# Patient Record
Sex: Male | Born: 1970 | Race: White | Hispanic: No | Marital: Married | Smoking: Never smoker
Health system: Southern US, Community
[De-identification: ages and names within clinical notes are randomized; demographics above are authoritative.]

## PROBLEM LIST (undated history)

## (undated) DIAGNOSIS — F32A Depression, unspecified: Secondary | ICD-10-CM

## (undated) DIAGNOSIS — I619 Nontraumatic intracerebral hemorrhage, unspecified: Secondary | ICD-10-CM

## (undated) DIAGNOSIS — I10 Essential (primary) hypertension: Secondary | ICD-10-CM

## (undated) DIAGNOSIS — G8929 Other chronic pain: Secondary | ICD-10-CM

## (undated) DIAGNOSIS — R55 Syncope and collapse: Secondary | ICD-10-CM

## (undated) DIAGNOSIS — T7840XA Allergy, unspecified, initial encounter: Secondary | ICD-10-CM

## (undated) DIAGNOSIS — R51 Headache: Secondary | ICD-10-CM

## (undated) DIAGNOSIS — K589 Irritable bowel syndrome without diarrhea: Secondary | ICD-10-CM

## (undated) DIAGNOSIS — R7989 Other specified abnormal findings of blood chemistry: Secondary | ICD-10-CM

## (undated) DIAGNOSIS — F419 Anxiety disorder, unspecified: Secondary | ICD-10-CM

## (undated) DIAGNOSIS — K219 Gastro-esophageal reflux disease without esophagitis: Secondary | ICD-10-CM

## (undated) DIAGNOSIS — F329 Major depressive disorder, single episode, unspecified: Secondary | ICD-10-CM

## (undated) DIAGNOSIS — R519 Headache, unspecified: Secondary | ICD-10-CM

## (undated) DIAGNOSIS — E785 Hyperlipidemia, unspecified: Secondary | ICD-10-CM

## (undated) DIAGNOSIS — B019 Varicella without complication: Secondary | ICD-10-CM

## (undated) DIAGNOSIS — E669 Obesity, unspecified: Secondary | ICD-10-CM

## (undated) HISTORY — DX: Irritable bowel syndrome, unspecified: K58.9

## (undated) HISTORY — DX: Obesity, unspecified: E66.9

## (undated) HISTORY — PX: NO PAST SURGERIES: SHX2092

## (undated) HISTORY — DX: Other specified abnormal findings of blood chemistry: R79.89

## (undated) HISTORY — DX: Allergy, unspecified, initial encounter: T78.40XA

## (undated) HISTORY — DX: Headache, unspecified: R51.9

## (undated) HISTORY — DX: Nontraumatic intracerebral hemorrhage, unspecified: I61.9

## (undated) HISTORY — DX: Anxiety disorder, unspecified: F41.9

## (undated) HISTORY — DX: Essential (primary) hypertension: I10

## (undated) HISTORY — DX: Depression, unspecified: F32.A

## (undated) HISTORY — DX: Syncope and collapse: R55

## (undated) HISTORY — DX: Varicella without complication: B01.9

## (undated) HISTORY — DX: Other chronic pain: G89.29

## (undated) HISTORY — DX: Headache: R51

## (undated) HISTORY — DX: Hyperlipidemia, unspecified: E78.5

## (undated) HISTORY — DX: Gastro-esophageal reflux disease without esophagitis: K21.9

---
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
## 2008-02-27 ENCOUNTER — Emergency Department (HOSPITAL_COMMUNITY): Admission: EM | Admit: 2008-02-27 | Discharge: 2008-02-27 | Payer: Self-pay | Admitting: Emergency Medicine

## 2011-10-21 ENCOUNTER — Other Ambulatory Visit: Payer: Self-pay | Admitting: Internal Medicine

## 2011-11-09 ENCOUNTER — Ambulatory Visit (INDEPENDENT_AMBULATORY_CARE_PROVIDER_SITE_OTHER): Payer: BC Managed Care – PPO | Admitting: Family Medicine

## 2011-11-09 VITALS — BP 117/81 | HR 71 | Temp 98.4°F | Resp 16 | Ht 67.0 in | Wt 215.4 lb

## 2011-11-09 DIAGNOSIS — J029 Acute pharyngitis, unspecified: Secondary | ICD-10-CM

## 2011-11-09 LAB — POCT RAPID STREP A (OFFICE): Rapid Strep A Screen: NEGATIVE

## 2011-11-09 MED ORDER — AMOXICILLIN 875 MG PO TABS: 875.0000 mg | ORAL_TABLET | Freq: Two times a day (BID) | ORAL | Status: AC

## 2011-11-09 NOTE — Progress Notes (Signed)
  Subjective:    Patient ID: Steven Holland, male    DOB: 21-Apr-1971, 41 y.o.   MRN: [id number]  HPI 41 yo male with URI  Symptoms - sore throat, ear pain, bodyaches.  Has had fever to 102.  Symptoms started 4 days ago with sore throat.  Saw white spots in back of throat.  No recent strep.  Still has tonsils.  Headache but no nausea.    Review of Systems Negative except as per HPI     Objective:   Physical Exam  Constitutional: He appears well-developed. No distress.  HENT:  Right Ear: Tympanic membrane, external ear and ear canal normal. Tympanic membrane is not injected, not scarred, not perforated, not erythematous, not retracted and not bulging.  Left Ear: Tympanic membrane, external ear and ear canal normal. Tympanic membrane is not injected, not scarred, not perforated, not erythematous, not retracted and not bulging.  Nose: No mucosal edema or rhinorrhea. Right sinus exhibits no maxillary sinus tenderness and no frontal sinus tenderness. Left sinus exhibits no maxillary sinus tenderness and no frontal sinus tenderness.  Mouth/Throat: Uvula is midline and mucous membranes are normal. Oropharyngeal exudate and posterior oropharyngeal erythema present. No tonsillar abscesses.  Cardiovascular: Normal rate, regular rhythm, normal heart sounds and intact distal pulses.   No murmur heard. Pulmonary/Chest: Effort normal and breath sounds normal. No respiratory distress. He has no wheezes. He has no rales.  Lymphadenopathy:       Head (right side): No submandibular and no preauricular adenopathy present.       Head (left side): No submandibular and no preauricular adenopathy present.       Right cervical: No superficial cervical and no posterior cervical adenopathy present.      Left cervical: No superficial cervical and no posterior cervical adenopathy present.       Right: No supraclavicular adenopathy present.       Left: No supraclavicular adenopathy present.  Skin: Skin is warm  and dry.  Tonsils mildly enlarged, red, with exudate.   Results for orders placed in visit on 11/09/11  POCT RAPID STREP A (OFFICE)      Component Value Range   Rapid Strep A Screen Negative  Negative          Assessment & Plan:  Sore throat, fever, tonsillitis Amoxicillin 875 BID x 10 days

## 2011-12-08 ENCOUNTER — Other Ambulatory Visit: Payer: Self-pay | Admitting: Internal Medicine

## 2011-12-18 ENCOUNTER — Ambulatory Visit (INDEPENDENT_AMBULATORY_CARE_PROVIDER_SITE_OTHER): Payer: BC Managed Care – PPO | Admitting: Internal Medicine

## 2011-12-18 VITALS — BP 115/75 | HR 80 | Temp 98.3°F | Resp 18 | Ht 67.75 in | Wt 217.4 lb

## 2011-12-18 DIAGNOSIS — K589 Irritable bowel syndrome without diarrhea: Secondary | ICD-10-CM

## 2011-12-18 DIAGNOSIS — I10 Essential (primary) hypertension: Secondary | ICD-10-CM | POA: Insufficient documentation

## 2011-12-18 DIAGNOSIS — E291 Testicular hypofunction: Secondary | ICD-10-CM

## 2011-12-18 DIAGNOSIS — K219 Gastro-esophageal reflux disease without esophagitis: Secondary | ICD-10-CM | POA: Insufficient documentation

## 2011-12-18 DIAGNOSIS — N529 Male erectile dysfunction, unspecified: Secondary | ICD-10-CM

## 2011-12-18 DIAGNOSIS — J309 Allergic rhinitis, unspecified: Secondary | ICD-10-CM

## 2011-12-18 DIAGNOSIS — G43109 Migraine with aura, not intractable, without status migrainosus: Secondary | ICD-10-CM | POA: Insufficient documentation

## 2011-12-18 MED ORDER — BISOPROLOL-HYDROCHLOROTHIAZIDE 10-6.25 MG PO TABS: 1.0000 | ORAL_TABLET | Freq: Every day | ORAL | Status: DC

## 2011-12-18 MED ORDER — BUTALBITAL-ASA-CAFF-CODEINE 50-325-40-30 MG PO CAPS: 1.0000 | ORAL_CAPSULE | ORAL | Status: DC | PRN

## 2011-12-18 MED ORDER — HYDROCODONE-ACETAMINOPHEN 5-325 MG PO TABS: 1.0000 | ORAL_TABLET | Freq: Four times a day (QID) | ORAL | Status: DC | PRN

## 2011-12-18 MED ORDER — BELLADONNA ALK-PHENOBARBITAL 48 MG PO TBCR: 1.0000 | EXTENDED_RELEASE_TABLET | Freq: Four times a day (QID) | ORAL | Status: DC | PRN

## 2011-12-18 MED ORDER — ONDANSETRON HCL 8 MG PO TABS: 8.0000 mg | ORAL_TABLET | Freq: Three times a day (TID) | ORAL | Status: DC | PRN

## 2011-12-18 MED ORDER — TADALAFIL 5 MG PO TABS: 2.5000 mg | ORAL_TABLET | Freq: Every day | ORAL | Status: DC

## 2011-12-18 NOTE — Patient Instructions (Signed)
After the lab results I will refill the AndroGel

## 2011-12-18 NOTE — Progress Notes (Addendum)
  Subjective:    Patient ID: Steven Holland, male    DOB: 1971/01/10, 41 y.o.   MRN: [id number]  HPI Patient Active Problem List  Diagnoses  . Hypogonadism male  . ED (erectile dysfunction)  . HTN (hypertension)  . Migraine headache with aura  . AR (allergic rhinitis)  . GERD (gastroesophageal reflux disease)  . Irritable bowel syndrome (IBS)  here for followup of his problems. Starting testosterone in November 2012 has been successful with improved mood and strength with change in belt size.Cialis 2.5 daily has also resulted in improved function and he is very satisfied. His other problems are stable at this point but he needs medicine refills. His headaches are much improved with occasional mild headaches and rare full-blown migraines.      Review of Systems  Constitutional: Negative for fatigue and unexpected weight change.  HENT: Negative for neck pain.        His allergies are active but are controlled by over-the-counter antihistamines  Eyes: Negative for visual disturbance.  Respiratory: Negative for apnea, cough, choking and wheezing.   Cardiovascular: Negative for chest pain, palpitations and leg swelling.  Gastrointestinal: Negative for nausea, abdominal pain, diarrhea and constipation.  Genitourinary: Negative for urgency, frequency and difficulty urinating.  Musculoskeletal: Negative for arthralgias.  Neurological: Negative for dizziness and light-headedness.  Psychiatric/Behavioral: Negative for sleep disturbance, dysphoric mood, decreased concentration and agitation.       Objective:   Physical Examvital signs are stable except weight 217 HEENT is clear Neurological is intact        Assessment & Plan:   Patient Active Problem List  Diagnoses  . Hypogonadism male  . ED (erectile dysfunction)  . HTN (hypertension)  . Migraine headache with aura  . AR (allergic rhinitis)  . GERD (gastroesophageal reflux disease)  . Irritable bowel syndrome (IBS)     All medicines are refilled for 6-12 months except androgel which will be evaluated after labs are completed/ This includes Ziac 10/6.25 Zofran, Donatal, Vicodin, Fioricet    Addendum 12/23/2011= testosterone levels are good at this dose so refill is faxed to pharmacy to continue 8 pumps daily= 10 g

## 2011-12-19 ENCOUNTER — Encounter: Payer: Self-pay | Admitting: Internal Medicine

## 2011-12-19 LAB — TESTOSTERONE, FREE, TOTAL, SHBG
Sex Hormone Binding: 13 nmol/L (ref 13–71)
Testosterone, Free: 117.3 pg/mL (ref 47.0–244.0)

## 2011-12-23 MED ORDER — TESTOSTERONE 12.5 MG/ACT (1%) TD GEL: 8.0000 "application " | Freq: Every day | TRANSDERMAL | Status: DC

## 2011-12-23 NOTE — Progress Notes (Signed)
Addended by: Tonye Pearson on: 12/23/2011 08:06 AM   Modules accepted: Orders

## 2012-02-03 ENCOUNTER — Ambulatory Visit (INDEPENDENT_AMBULATORY_CARE_PROVIDER_SITE_OTHER): Payer: BC Managed Care – PPO | Admitting: Family Medicine

## 2012-02-03 VITALS — BP 112/72 | HR 79 | Temp 98.4°F | Resp 20 | Ht 67.5 in | Wt 217.0 lb

## 2012-02-03 DIAGNOSIS — R821 Myoglobinuria: Secondary | ICD-10-CM

## 2012-02-03 DIAGNOSIS — R319 Hematuria, unspecified: Secondary | ICD-10-CM

## 2012-02-03 DIAGNOSIS — H209 Unspecified iridocyclitis: Secondary | ICD-10-CM

## 2012-02-03 DIAGNOSIS — R8281 Pyuria: Secondary | ICD-10-CM

## 2012-02-03 LAB — POCT CBC
Granulocyte percent: 53.6 %G (ref 37–80)
HCT, POC: 42.7 % — AB (ref 43.5–53.7)
Hemoglobin: 13.9 g/dL — AB (ref 14.1–18.1)
Lymph, poc: 2.3 (ref 0.6–3.4)
MCH, POC: 28.8 pg (ref 27–31.2)
MCHC: 32.6 g/dL (ref 31.8–35.4)
MCV: 88.4 fL (ref 80–97)
MID (cbc): 0.8 (ref 0–0.9)
MPV: 7.6 fL (ref 0–99.8)
POC Granulocyte: 3.6 (ref 2–6.9)
POC LYMPH PERCENT: 34.2 %L (ref 10–50)
POC MID %: 12.2 %M — AB (ref 0–12)
Platelet Count, POC: 251 10*3/uL (ref 142–424)
RBC: 4.83 M/uL (ref 4.69–6.13)
RDW, POC: 14.7 %
WBC: 6.8 10*3/uL (ref 4.6–10.2)

## 2012-02-03 LAB — COMPREHENSIVE METABOLIC PANEL
ALT: 28 U/L (ref 0–53)
AST: 19 U/L (ref 0–37)
Albumin: 4.4 g/dL (ref 3.5–5.2)
Alkaline Phosphatase: 74 U/L (ref 39–117)
BUN: 12 mg/dL (ref 6–23)
CO2: 30 mEq/L (ref 19–32)
Calcium: 9.1 mg/dL (ref 8.4–10.5)
Chloride: 100 mEq/L (ref 96–112)
Creat: 0.97 mg/dL (ref 0.50–1.35)
Glucose, Bld: 101 mg/dL — ABNORMAL HIGH (ref 70–99)
Potassium: 3.8 mEq/L (ref 3.5–5.3)
Sodium: 139 mEq/L (ref 135–145)
Total Bilirubin: 0.3 mg/dL (ref 0.3–1.2)
Total Protein: 7.6 g/dL (ref 6.0–8.3)

## 2012-02-03 LAB — POCT URINALYSIS DIPSTICK
Bilirubin, UA: NEGATIVE
Blood, UA: NEGATIVE
Glucose, UA: NEGATIVE
Ketones, UA: NEGATIVE
Nitrite, UA: NEGATIVE
Protein, UA: NEGATIVE
Spec Grav, UA: 1.025
Urobilinogen, UA: 0.2
pH, UA: 5.5

## 2012-02-03 LAB — POCT UA - MICROSCOPIC ONLY
Casts, Ur, LPF, POC: NEGATIVE
Crystals, Ur, HPF, POC: NEGATIVE
Mucus, UA: NEGATIVE
Yeast, UA: NEGATIVE

## 2012-02-03 LAB — POCT SEDIMENTATION RATE: POCT SED RATE: 50 mm/hr — AB (ref 0–22)

## 2012-02-03 MED ORDER — CIPROFLOXACIN HCL 500 MG PO TABS: 500.0000 mg | ORAL_TABLET | Freq: Two times a day (BID) | ORAL | Status: AC

## 2012-02-03 MED ORDER — PREDNISOLONE ACETATE 1 % OP SUSP: 1.0000 [drp] | Freq: Three times a day (TID) | OPHTHALMIC | Status: AC

## 2012-02-03 NOTE — Patient Instructions (Signed)
Recheck in 5 days   Urinary Tract Infection Infections of the urinary tract can start in several places. A bladder infection (cystitis), a kidney infection (pyelonephritis), and a prostate infection (prostatitis) are different types of urinary tract infections (UTIs). They usually get better if treated with medicines (antibiotics) that kill germs. Take all the medicine until it is gone. You or your child may feel better in a few days, but TAKE ALL MEDICINE or the infection may not respond and may become more difficult to treat. HOME CARE INSTRUCTIONS   Drink enough water and fluids to keep the urine clear or pale yellow. Cranberry juice is especially recommended, in addition to large amounts of water.   Avoid caffeine, tea, and carbonated beverages. They tend to irritate the bladder.   Alcohol may irritate the prostate.   Only take over-the-counter or prescription medicines for pain, discomfort, or fever as directed by your caregiver.  To prevent further infections:  Empty the bladder often. Avoid holding urine for long periods of time.   After a bowel movement, women should cleanse from front to back. Use each tissue only once.   Empty the bladder before and after sexual intercourse.  FINDING OUT THE RESULTS OF YOUR TEST Not all test results are available during your visit. If your or your child's test results are not back during the visit, make an appointment with your caregiver to find out the results. Do not assume everything is normal if you have not heard from your caregiver or the medical facility. It is important for you to follow up on all test results. SEEK MEDICAL CARE IF:   There is back pain.   Your baby is older than 3 months with a rectal temperature of 100.5 F (38.1 C) or higher for more than 1 day.   Your or your child's problems (symptoms) are no better in 3 days. Return sooner if you or your child is getting worse.  SEEK IMMEDIATE MEDICAL CARE IF:   There is  severe back pain or lower abdominal pain.   You or your child develops chills.   You have a fever.   Your baby is older than 3 months with a rectal temperature of 102 F (38.9 C) or higher.   Your baby is 72 months old or younger with a rectal temperature of 100.4 F (38 C) or higher.   There is nausea or vomiting.   There is continued burning or discomfort with urination.  MAKE SURE YOU:   Understand these instructions.   Will watch your condition.   Will get help right away if you are not doing well or get worse.  Document Released: 06/01/2005 Document Revised: 08/11/2011 Document Reviewed: 01/04/2007 [facility] Patient Information 2012 [place], [place].Iritis Iritis is an inflammation of the colored part of the eye (iris). Other parts at the front of the eye may also be inflamed. The iris is part of the middle layer of the eyeball which is called the uvea or the uveal track. Any part of the uveal track can become inflamed. The other portions of the uveal track are the choroid (the thin membrane under the outer layer of the eye), and the ciliary body (joins the choroid and the iris and produces the fluid in the front of the eye).  It is extremely important to treat iritis early, as it may lead to internal eye damage causing scarring or diseases such as glaucoma. Some people have only one attack of iritis (in one or both eyes)  in their lifetime, while others may get it many times. CAUSES Iritis can be associated with many different diseases, but mostly occurs in otherwise healthy people. Examples of diseases that can be associated with iritis include:  Diseases where the body's immune system attacks tissues within your own body (autoimmune diseases).   Infections (tuberculosis, gonorrhea, fungus infections, Lyme disease, infection of the lining of the heart).   Trauma or injury.   Eye diseases (acute glaucoma and others).   Inflammation from other parts of the uveal track.    Severe eye infections.   Other rare diseases.  SYMPTOMS  Eye pain or aching.   Sensitivity to light.   Loss of sight or blurred vision.   Redness of the eye. This is often accompanied by a ring of redness around the outside of the cornea, or clear covering at the front of the eye (ciliary flush).   Excessive tearing of the eye(s).   A small pupil that does not enlarge in the dark and stays smaller than the other eye's pupil.   A whitish area that obscures the lower part of the colored circular iris. Sometimes this is visible when looking at the eye, where the whitish area has a "fluid level" or flat top. This is called a "hypopyon" and is actually pus inside the eye.  Since iritis causes the eye to become red, it is often confused with a much less dangerous form of "pink eye" or conjunctivitis. One of the most important symptoms is sensitivity to light. Anytime there is redness, discomfort in the eye(s) and extreme light sensitivity, it is extremely important to see an ophthalmologist as soon as possible. TREATMENT Acute iritis requires prompt medical evaluation by an eye specialist (ophthalmologist.) Treatment depends on the underlying cause but may include:  Corticosteroid eye drops and dilating eye drops. Follow your caregiver's exact instructions on taking and stopping corticosteroid medications (drops or pills).   Occasionally, the iritis will be so severe that it will not respond to commonly used medications. If this happens, it may be necessary to use steroid injections. The injections are given under the eye's outer surface. Sometimes oral medications are given. The decision on treatment used for iritis is usually made on an individual basis.  HOME CARE INSTRUCTIONS Your care giver will give specific instructions regarding the use of eye medications or other medications. Be certain to follow all instructions in both taking and stopping the medications. SEEK IMMEDIATE MEDICAL  CARE IF:  You have redness of one or both eye.   You experience a great deal of light sensitivity.   You have pain or aching in either eye.  MAKE SURE YOU:   Understand these instructions.   Will watch your condition.   Will get help right away if you are not doing well or get worse.  Document Released: 08/22/2005 Document Revised: 08/11/2011 Document Reviewed: 02/09/2007 [facility] Patient Information 2012 [place], [place].

## 2012-02-03 NOTE — Progress Notes (Signed)
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 Hematuria is gotten worse in the last 24 hours.  Patient's family history is positive for joint pains (mother), and heart disease (father)  Patient has no joint problems, no rash, no dysphagia or oral ulcers.  Objective: Right eye shows diffuse scleral injection with perilimbal predominance.  Oropharynx: Clear TMs: Normal Neck: Supple, no adenopathy or thyromegaly Chest: Clear to auscultation Heart: Regular, no murmur no gallop and no rub Abdomen: Soft nontender without HSM or masses Genitalia: Circumcised male no lesions, no hernia, normal testes Skin: No rashes    Assessment:  Plan: Metabolic profile, sedimentation rate, urine culture, ANA are all pending Pred forte to right eye 3 times a day x3 days cipro 500 bid x 7 days followup 5 days

## 2012-02-05 ENCOUNTER — Other Ambulatory Visit: Payer: Self-pay | Admitting: Family Medicine

## 2012-02-05 DIAGNOSIS — H209 Unspecified iridocyclitis: Secondary | ICD-10-CM

## 2012-02-05 DIAGNOSIS — R700 Elevated erythrocyte sedimentation rate: Secondary | ICD-10-CM

## 2012-02-05 LAB — URINE CULTURE
Colony Count: NO GROWTH
Organism ID, Bacteria: NO GROWTH

## 2012-02-06 LAB — ANA: Anti Nuclear Antibody(ANA): NEGATIVE

## 2012-02-07 ENCOUNTER — Encounter: Payer: Self-pay | Admitting: *Deleted

## 2012-02-19 ENCOUNTER — Other Ambulatory Visit: Payer: Self-pay | Admitting: *Deleted

## 2012-02-19 DIAGNOSIS — G43109 Migraine with aura, not intractable, without status migrainosus: Secondary | ICD-10-CM

## 2012-02-19 MED ORDER — HYDROCODONE-ACETAMINOPHEN 5-325 MG PO TABS: 1.0000 | ORAL_TABLET | ORAL | Status: DC | PRN

## 2012-04-27 ENCOUNTER — Telehealth: Payer: Self-pay

## 2012-04-27 NOTE — Telephone Encounter (Signed)
Could try regular Donnatal tablets to take one before each meal and one at bedtime as needed #60 with 2 refills

## 2012-04-27 NOTE — Telephone Encounter (Signed)
Donnatol extended release not available, can you advise on alternative?

## 2012-04-27 NOTE — Telephone Encounter (Signed)
[facility] [place] [facility] pts [facility] for darnatol. States the extended tablets are not available and ask for an alternative.   [facility]: [id number]  bf

## 2012-04-28 NOTE — Telephone Encounter (Signed)
Called [facility] [place], advised pharmacist RX to change to regular Donnatal.

## 2012-07-16 ENCOUNTER — Ambulatory Visit (INDEPENDENT_AMBULATORY_CARE_PROVIDER_SITE_OTHER): Payer: BC Managed Care – PPO | Admitting: Internal Medicine

## 2012-07-16 VITALS — BP 138/90 | HR 96 | Temp 98.6°F | Resp 16 | Ht 67.25 in | Wt 217.8 lb

## 2012-07-16 DIAGNOSIS — J019 Acute sinusitis, unspecified: Secondary | ICD-10-CM

## 2012-07-16 DIAGNOSIS — E291 Testicular hypofunction: Secondary | ICD-10-CM

## 2012-07-16 DIAGNOSIS — J329 Chronic sinusitis, unspecified: Secondary | ICD-10-CM

## 2012-07-16 MED ORDER — TESTOSTERONE 12.5 MG/ACT (1%) TD GEL: 8.0000 "application " | Freq: Every day | TRANSDERMAL | Status: DC

## 2012-07-16 MED ORDER — AMOXICILLIN 500 MG PO CAPS: 1000.0000 mg | ORAL_CAPSULE | Freq: Two times a day (BID) | ORAL | Status: AC

## 2012-07-16 NOTE — Progress Notes (Signed)
  Subjective:    Patient ID: Steven Holland, male    DOB: Sep 07, 1970, 41 y.o.   MRN: [id number]  HPI Ever since having a bad chest: Week ago he has had pain above his left eye that gets worse as the day goes on and is relieved by lying flat and sleeping during the night No fever/congested in the a.m. Only/no sore throat/no cough   Review of Systems Feels good at this dose of AndroGel/ Labs were good at this dose in April   needs refill Objective:   Physical Exam Filed Vitals:   07/16/12 1637  BP: 138/90  Pulse: 96  Temp: 98.6 F (37 C)  Resp: 16   Pupils equal round reactive to light Conjunctiva clear Nares boggy Very tender to percussion left frontal Throat clear No nodes       Assessment & Plan:  Problem #1 sinusitis Amoxicillin 1 g twice a day for 10 days  Problem #2 hypogonadism Refill AndroGel-labs in 6 mo  Meds ordered this encounter  Medications  . amoxicillin (AMOXIL) 500 MG capsule    Sig: Take 2 capsules (1,000 mg total) by mouth 2 (two) times daily.    Dispense:  40 capsule    Refill:  0  . Testosterone (ANDROGEL PUMP) 12.5 MG/ACT (1%) GEL    Sig: Apply 8 application topically daily. 10 g daily applied by 8 pumps    Dispense:  1 Bottle    Refill:  5

## 2012-09-21 ENCOUNTER — Other Ambulatory Visit: Payer: Self-pay | Admitting: Internal Medicine

## 2012-12-17 ENCOUNTER — Ambulatory Visit (INDEPENDENT_AMBULATORY_CARE_PROVIDER_SITE_OTHER): Payer: BC Managed Care – PPO | Admitting: Internal Medicine

## 2012-12-17 VITALS — BP 127/82 | HR 86 | Temp 97.8°F | Resp 17 | Ht 67.25 in | Wt 213.2 lb

## 2012-12-17 DIAGNOSIS — G43909 Migraine, unspecified, not intractable, without status migrainosus: Secondary | ICD-10-CM

## 2012-12-17 DIAGNOSIS — H6592 Unspecified nonsuppurative otitis media, left ear: Secondary | ICD-10-CM

## 2012-12-17 DIAGNOSIS — H6590 Unspecified nonsuppurative otitis media, unspecified ear: Secondary | ICD-10-CM

## 2012-12-17 DIAGNOSIS — I10 Essential (primary) hypertension: Secondary | ICD-10-CM

## 2012-12-17 DIAGNOSIS — K589 Irritable bowel syndrome without diarrhea: Secondary | ICD-10-CM

## 2012-12-17 DIAGNOSIS — N529 Male erectile dysfunction, unspecified: Secondary | ICD-10-CM

## 2012-12-17 DIAGNOSIS — E291 Testicular hypofunction: Secondary | ICD-10-CM

## 2012-12-17 LAB — POCT CBC
Granulocyte percent: 70.2 %G (ref 37–80)
MCV: 89.3 fL (ref 80–97)
MID (cbc): 0.7 (ref 0–0.9)
MPV: 7.7 fL (ref 0–99.8)
POC Granulocyte: 7.6 — AB (ref 2–6.9)
POC LYMPH PERCENT: 23.5 %L (ref 10–50)
POC MID %: 6.3 %M (ref 0–12)
Platelet Count, POC: 332 10*3/uL (ref 142–424)
RBC: 5.32 M/uL (ref 4.69–6.13)
RDW, POC: 13.9 %

## 2012-12-17 MED ORDER — HYDROCODONE-ACETAMINOPHEN 5-325 MG PO TABS: ORAL_TABLET | ORAL | Status: DC

## 2012-12-17 MED ORDER — BUTALBITAL-ASA-CAFF-CODEINE 50-325-40-30 MG PO CAPS: ORAL_CAPSULE | ORAL | Status: DC

## 2012-12-17 MED ORDER — TESTOSTERONE 12.5 MG/ACT (1%) TD GEL: 8.0000 "application " | Freq: Every day | TRANSDERMAL | Status: DC

## 2012-12-17 MED ORDER — AMOXICILLIN 875 MG PO TABS: 875.0000 mg | ORAL_TABLET | Freq: Two times a day (BID) | ORAL | Status: DC

## 2012-12-17 MED ORDER — BISOPROLOL-HYDROCHLOROTHIAZIDE 10-6.25 MG PO TABS: 1.0000 | ORAL_TABLET | Freq: Every day | ORAL | Status: DC

## 2012-12-17 MED ORDER — BELLADONNA ALK-PHENOBARBITAL 48 MG PO TBCR: 1.0000 | EXTENDED_RELEASE_TABLET | Freq: Four times a day (QID) | ORAL | Status: DC | PRN

## 2012-12-17 MED ORDER — ONDANSETRON HCL 8 MG PO TABS: 8.0000 mg | ORAL_TABLET | Freq: Three times a day (TID) | ORAL | Status: DC | PRN

## 2012-12-17 MED ORDER — TADALAFIL 2.5 MG PO TABS: 2.5000 mg | ORAL_TABLET | Freq: Every day | ORAL | Status: DC

## 2012-12-17 NOTE — Progress Notes (Signed)
  Subjective:    Patient ID: Steven Holland, male    DOB: 1971-05-24, 42 y.o.   MRN: [id number]  HPI  Patient Active Problem List  Diagnosis  . Hypogonadism male--on Andro pump and doing well---no further sweats Feels like much more energy and strength  . ED (erectile dysfunction)---daily cialis working well  . HTN (hypertension)- has had variable pressures--was low in Jan-too low/felt weak and dizzy, so he discontinued his medication and blood pressure 100/60 ///it took 2 months for blood pressure reached 139/90 and he restarted medication   . Migraine headache with aura--still vary with weather but no missed work  . AR (allergic rhinitis)--has started meds again due to symptoms   . GERD (gastroesophageal reflux disease)--controlled omep 10 otc  . Irritable bowel syndrome (IBS)---probably due to lactose intolerance No further passing out spells since lactose free occas donnatal   Otalgia 3d//decr hearing Started w/ AR 1 week ago   Review of Systems  Constitutional: Negative for activity change, appetite change, fatigue and unexpected weight change.  Eyes: Negative for visual disturbance.  Respiratory: Negative for shortness of breath.   Cardiovascular: Negative for chest pain, palpitations and leg swelling.  Gastrointestinal: Negative for diarrhea and constipation.  Genitourinary: Negative for difficulty urinating.  Musculoskeletal: Negative for back pain.  Skin: Negative for rash.  Psychiatric/Behavioral: Negative for sleep disturbance.       Objective:   Physical Exam BP 127/82  Pulse 86  Temp(Src) 97.8 F (36.6 C) (Oral)  Resp 17  Ht 5' 7.25" (1.708 m)  Wt 213 lb 3.2 oz (96.707 kg)  BMI 33.15 kg/m2  SpO2 96% Conjunctiva clear Left TM red with fluid level Right TM clear Nares boggy Throat clear No nodes Chest clear Heart regular       Assessment & Plan:  ED (erectile dysfunction) - Plan: tadalafil 2.5 MG TABS  HTN (hypertension) - Plan:  bisoprolol-hydrochlorothiazide (ZIAC) 10-6.25 MG per tablet, POCT CBC, Comprehensive metabolic panel, Lipid panel////continue to monitor home blood pressures and we can adjust medicines if he becomes low again  Hypogonadism male - Plan: Testosterone (ANDROGEL PUMP) 12.5 MG/ACT (1%) GEL, PSA, Testosterone, free, total  Irritable bowel syndrome (IBS) - Plan: ondansetron (ZOFRAN) 8 MG tablet, phenobarbital-belladonna alkaloids (DONNATAL EXTENTABS) 48 MG CR tablet(((pharmacy called and Extentabs no longer available so this was changed to Donatal regular tabs)))  Migraine - Plan: butalbital-aspirin-caffeine-codeine (ASCOMP-CODEINE) 50-325-40-30 MG capsule, HYDROcodone-acetaminophen (NORCO/VICODIN) 5-325 MG per tablet///when headaches are out of control he takes the codeine first and if no change in 2 hrs will change to hydrocodone  OME (otitis media with effusion), left - Plan: amoxicillin (AMOXIL) 875 MG tablet  F/u 6-12 mos

## 2012-12-18 LAB — COMPREHENSIVE METABOLIC PANEL
ALT: 26 U/L (ref 0–53)
AST: 17 U/L (ref 0–37)
Albumin: 4.5 g/dL (ref 3.5–5.2)
Alkaline Phosphatase: 83 U/L (ref 39–117)
BUN: 12 mg/dL (ref 6–23)
Calcium: 9.7 mg/dL (ref 8.4–10.5)
Chloride: 102 mEq/L (ref 96–112)
Potassium: 4.6 mEq/L (ref 3.5–5.3)

## 2012-12-18 LAB — PSA: PSA: 0.94 ng/mL (ref <=4.00)

## 2012-12-18 LAB — LIPID PANEL
HDL: 35 mg/dL — ABNORMAL LOW (ref >39)
LDL Cholesterol: 119 mg/dL — ABNORMAL HIGH (ref 0–99)
Total CHOL/HDL Ratio: 5.7 Ratio

## 2012-12-19 LAB — TESTOSTERONE, FREE, TOTAL, SHBG
Testosterone, Free: 47.3 pg/mL (ref 47.0–244.0)
Testosterone: 176 ng/dL — ABNORMAL LOW (ref 300–890)

## 2012-12-24 ENCOUNTER — Encounter: Payer: Self-pay | Admitting: Internal Medicine

## 2013-08-07 ENCOUNTER — Telehealth: Payer: Self-pay

## 2013-08-07 ENCOUNTER — Encounter: Payer: Self-pay | Admitting: Internal Medicine

## 2013-08-07 ENCOUNTER — Ambulatory Visit (INDEPENDENT_AMBULATORY_CARE_PROVIDER_SITE_OTHER): Payer: BC Managed Care – PPO | Admitting: Internal Medicine

## 2013-08-07 VITALS — BP 126/90 | HR 75 | Temp 98.4°F | Resp 16 | Ht 67.0 in | Wt 220.8 lb

## 2013-08-07 DIAGNOSIS — R197 Diarrhea, unspecified: Secondary | ICD-10-CM

## 2013-08-07 DIAGNOSIS — G43109 Migraine with aura, not intractable, without status migrainosus: Secondary | ICD-10-CM

## 2013-08-07 DIAGNOSIS — K219 Gastro-esophageal reflux disease without esophagitis: Secondary | ICD-10-CM

## 2013-08-07 DIAGNOSIS — K589 Irritable bowel syndrome without diarrhea: Secondary | ICD-10-CM

## 2013-08-07 DIAGNOSIS — G43909 Migraine, unspecified, not intractable, without status migrainosus: Secondary | ICD-10-CM

## 2013-08-07 DIAGNOSIS — I10 Essential (primary) hypertension: Secondary | ICD-10-CM

## 2013-08-07 DIAGNOSIS — H0289 Other specified disorders of eyelid: Secondary | ICD-10-CM

## 2013-08-07 DIAGNOSIS — E291 Testicular hypofunction: Secondary | ICD-10-CM

## 2013-08-07 DIAGNOSIS — N529 Male erectile dysfunction, unspecified: Secondary | ICD-10-CM

## 2013-08-07 MED ORDER — TESTOSTERONE 12.5 MG/ACT (1%) TD GEL: 8.0000 "application " | Freq: Every day | TRANSDERMAL | Status: DC

## 2013-08-07 MED ORDER — BUTALBITAL-ASA-CAFF-CODEINE 50-325-40-30 MG PO CAPS: ORAL_CAPSULE | ORAL | Status: DC

## 2013-08-07 MED ORDER — HYDROCODONE-ACETAMINOPHEN 5-325 MG PO TABS: ORAL_TABLET | ORAL | Status: DC

## 2013-08-07 NOTE — Telephone Encounter (Signed)
[facility] called and wanted [place] to know that they don't make 1% Andro Gel any more Dr Merla Riches changed it to 1.62% with same directions

## 2013-08-07 NOTE — Progress Notes (Signed)
Subjective:    Patient ID: Steven Holland, male    DOB: [birth year], 42 y.o.   MRN: [id number]  HPIf/u meds etc Patient Active Problem List   Diagnosis Date Noted  . Hypogonadism male 12/18/2011  . ED (erectile dysfunction) 12/18/2011  . HTN (hypertension) 12/18/2011  . Migraine headache with aura 12/18/2011  . AR (allergic rhinitis) 12/18/2011  . GERD (gastroesophageal reflux disease) 12/18/2011  . Irritable bowel syndrome (IBS) 12/18/2011   Current outpatient prescriptions:bisoprolol-hydrochlorothiazide (ZIAC) 10-6.25 MG per tablet, Take 1 tablet by mouth daily., Disp: 90 tablet, Rfl: 3;  butalbital-aspirin-caffeine-codeine (ASCOMP-CODEINE) 50-325-40-30 MG capsule, TAKE 1 CAPSULE BY MOUTH EVERY 4 HOURS AS NEEDED, Disp: 30 capsule, Rfl: 5;  cetirizine (ZYRTEC) 10 MG tablet, Take 10 mg by mouth daily., Disp: , Rfl:  HYDROcodone-acetaminophen (NORCO/VICODIN) 5-325 MG per tablet, TAKE 1 TABLET BY MOUTH EVERY 6 HOURS AS NEEDED, Disp: 90 tablet, Rfl: 0;  Multiple Vitamin (MULITIVITAMIN WITH MINERALS) TABS, Take 1 tablet by mouth daily., Disp: , Rfl: ;  omeprazole (PRILOSEC) 10 MG capsule, Take 10 mg by mouth daily., Disp: , Rfl: ;  ondansetron (ZOFRAN) 8 MG tablet, Take 1 tablet (8 mg total) by mouth every 8 (eight) hours as needed., Disp: 20 tablet, Rfl: 5 tadalafil 2.5 MG TABS, Take 1 tablet (2.5 mg total) by mouth daily., Disp: 30 tablet, Rfl: 11;  Testosterone (ANDROGEL PUMP) 12.5 MG/ACT (1%) GEL, Apply 8 application topically daily. 10 g daily applied by 8 pumps, Disp: 1 Bottle, Rfl: 5   IBS-nausea,intstinal cramping(milk but not all dairy) Diarrhea still frequent no pattern Tried probiotics-got worse Gluten not defn an issue  ED doing well w/ testos  htn stable  HAs doing well  Gerd stable  Funny irrit of lower eyelid on L-recurrent-small tender white spots inside lower lid/come and go--has tried lubricants, all drops, heat w/out resolution  Review of Systems    Constitutional: Negative for fever, chills, activity change, appetite change, fatigue and unexpected weight change.  HENT: Negative for hearing loss and trouble swallowing.   Eyes: Negative for visual disturbance.  Respiratory: Negative for chest tightness and shortness of breath.   Cardiovascular: Negative for chest pain, palpitations and leg swelling.  Gastrointestinal: Negative for abdominal pain.  Genitourinary: Negative for difficulty urinating.  Psychiatric/Behavioral: Negative for behavioral problems, confusion, sleep disturbance, self-injury and decreased concentration. The patient is not nervous/anxious.        Objective:   Physical Exam  Nursing note and vitals reviewed. Constitutional: He is oriented to person, place, and time. He appears well-developed and well-nourished. No distress.  HENT:  Head: Normocephalic and atraumatic.  Nose: Nose normal.  Mouth/Throat: Oropharynx is clear and moist.  Eyes: Pupils are equal, round, and reactive to light.  No discrete lesions L eye  Neck: Normal range of motion.  Cardiovascular: Normal rate and regular rhythm.   Pulmonary/Chest: Effort normal. No respiratory distress.  Musculoskeletal: Normal range of motion.  Neurological: He is alert and oriented to person, place, and time.  Skin: Skin is warm and dry.  Psychiatric: He has a normal mood and affect. His behavior is normal.       Assessment & Plan:  Migraine - Plan: butalbital-aspirin-caffeine-codeine (ASCOMP-CODEINE) 50-325-40-30 MG capsule, HYDROcodone-acetaminophen (NORCO/VICODIN) 5-325 MG per tablet  Irritable bowel syndrome (IBS) - Plan: Ambulatory referral to Gastroenterology  Hypogonadism male - Plan: Testosterone (ANDROGEL PUMP) 12.5 MG/ACT (1%) GEL  Irritation of eyelid---?chronic hordeolums--to opthal  Diarrhea - Plan: Ambulatory referral to Gastroenterology to r/o other cause besides IBS  Migraine headache with aura-stable on meds  HTN  (hypertension)-stable  GERD (gastroesophageal reflux disease)  ED (erectile dysfunction)

## 2013-08-07 NOTE — Telephone Encounter (Signed)
[facility] FROM [facility] HAVE QUESTIONS REGARDING PT'S MEDICINE. PLEASE CALL [phone]

## 2014-01-02 ENCOUNTER — Other Ambulatory Visit: Payer: Self-pay | Admitting: Internal Medicine

## 2014-01-04 ENCOUNTER — Other Ambulatory Visit: Payer: Self-pay | Admitting: Internal Medicine

## 2014-01-06 ENCOUNTER — Other Ambulatory Visit: Payer: Self-pay | Admitting: Internal Medicine

## 2014-03-30 ENCOUNTER — Other Ambulatory Visit: Payer: Self-pay | Admitting: Internal Medicine

## 2014-03-31 NOTE — Telephone Encounter (Signed)
Faxed rx

## 2014-04-17 ENCOUNTER — Other Ambulatory Visit: Payer: Self-pay | Admitting: Internal Medicine

## 2014-04-17 NOTE — Telephone Encounter (Signed)
I refilled pt's other Rx for 1 mos w/note to RTC for more. Do you want to give him 1 mos RF of androgel?

## 2014-04-18 NOTE — Telephone Encounter (Signed)
Faxed

## 2014-05-09 ENCOUNTER — Ambulatory Visit (INDEPENDENT_AMBULATORY_CARE_PROVIDER_SITE_OTHER): Payer: BC Managed Care – PPO | Admitting: Internal Medicine

## 2014-05-09 VITALS — BP 136/92 | HR 86 | Temp 98.4°F | Resp 16 | Ht 67.75 in | Wt 226.2 lb

## 2014-05-09 DIAGNOSIS — E291 Testicular hypofunction: Secondary | ICD-10-CM

## 2014-05-09 DIAGNOSIS — I10 Essential (primary) hypertension: Secondary | ICD-10-CM

## 2014-05-09 DIAGNOSIS — G44219 Episodic tension-type headache, not intractable: Secondary | ICD-10-CM

## 2014-05-09 DIAGNOSIS — G44211 Episodic tension-type headache, intractable: Secondary | ICD-10-CM

## 2014-05-09 LAB — CBC WITH DIFFERENTIAL/PLATELET
BASOS PCT: 0 % (ref 0–1)
Basophils Absolute: 0 10*3/uL (ref 0.0–0.1)
EOS ABS: 0.1 10*3/uL (ref 0.0–0.7)
EOS PCT: 1 % (ref 0–5)
HCT: 42.7 % (ref 39.0–52.0)
Hemoglobin: 14.9 g/dL (ref 13.0–17.0)
LYMPHS ABS: 2 10*3/uL (ref 0.7–4.0)
Lymphocytes Relative: 20 % (ref 12–46)
MCH: 29.8 pg (ref 26.0–34.0)
MCHC: 34.9 g/dL (ref 30.0–36.0)
MCV: 85.4 fL (ref 78.0–100.0)
Monocytes Absolute: 0.8 10*3/uL (ref 0.1–1.0)
Monocytes Relative: 8 % (ref 3–12)
Neutro Abs: 7.1 10*3/uL (ref 1.7–7.7)
Neutrophils Relative %: 71 % (ref 43–77)
Platelets: 255 10*3/uL (ref 150–400)
RBC: 5 MIL/uL (ref 4.22–5.81)
RDW: 14 % (ref 11.5–15.5)
WBC: 10 10*3/uL (ref 4.0–10.5)

## 2014-05-09 MED ORDER — BUTALBITAL-APAP-CAFF-COD 50-325-40-30 MG PO CAPS: ORAL_CAPSULE | ORAL | Status: DC

## 2014-05-09 MED ORDER — ONDANSETRON HCL 8 MG PO TABS: ORAL_TABLET | ORAL | Status: DC

## 2014-05-09 MED ORDER — TESTOSTERONE 20.25 MG/ACT (1.62%) TD GEL: TRANSDERMAL | Status: DC

## 2014-05-09 MED ORDER — HYDROCODONE-ACETAMINOPHEN 5-325 MG PO TABS: ORAL_TABLET | ORAL | Status: DC

## 2014-05-09 MED ORDER — BISOPROLOL-HYDROCHLOROTHIAZIDE 10-6.25 MG PO TABS: ORAL_TABLET | ORAL | Status: DC

## 2014-05-09 MED ORDER — TOPIRAMATE 25 MG PO TABS: 25.0000 mg | ORAL_TABLET | Freq: Every day | ORAL | Status: DC

## 2014-05-09 NOTE — Progress Notes (Signed)
Subjective:  This chart was scribed for Tonye Pearson, MD by Bronson Curb, ED Scribe. This patient was seen in room Room/bed 13 and the patient's care was started at 2:53 PM.    Patient ID: Steven Holland, male    DOB: 08-21-1971, 43 y.o.   MRN: [id number]  HPI  HPI Comments: Steven Holland is a 43 y.o. male who presents to the Emergency Department complaining of gradually worsening, moderate HA that have become more frequent over the last several months. Patient reports his HA's "get worse every year", and states he missed work yesterday due to his most recent HA (onset 1 week ago). He reports wakes up with a majority of his HA's ("80%"), and states they are not consistently responding to medication (Fioricet as 1st line with hydrocodone as 2nd line), and reports success only "half of the time". He reports his HAs are usually triggered by changes in weather (from rain to sun or from sun to rain). This year, he notices more instances of HA with transitions from rainy weather to sunny weather with the HA remaining until the barometric pressure changes with the weather. Patient states his HA's are never one-sided and begin as "lower grade HA's" that are primarily located in the back of his head and radiates to his temples with progression. There is associated occasional nausea, dizzinness (at times), and sensitivity to odors and motion. He states ambient lighting exacerbates his symptoms. Patient states he has been snoring the past few years, and feels as though he has not had enough sleep upon waking. He states this is due to only getting 4-5 hours of sleep, but denies trouble staying awake, and reports no detrimental effects to his daytime routines. Patient denies vision changes, neck pain/stiffness. Patient has history of HTN, migraine, AR, GERD, IBS, ED, and hypogonadism Current job not perceived as stressful like his last one. In the past his headaches improved with chiropractic  acupuncture.  Patient also states he is running low on his BP medication. He reports gradual weight gain despite working outside during the summer. He states the testerone increases his appetite, and is looking into changing is diet. He still using Zofran for nausea caused by his HA's (and other sources of nausea), however, he states he only has to take this on very rare occasions. He denies any recent syncopal episodes. He denies any recent chagnes with his BP and takes his BP medication on occasion. Patient wears glasses and states he has not had his eyes examined in several years, but plans to get them rechecked this fall.  Testosterone replacement is effective    Patient Active Problem List   Diagnosis Date Noted  . Hypogonadism male 12/18/2011  . ED (erectile dysfunction) 12/18/2011  . HTN (hypertension) 12/18/2011  . Migraine headache with aura 12/18/2011  . AR (allergic rhinitis) 12/18/2011  . GERD (gastroesophageal reflux disease) 12/18/2011  . Irritable bowel syndrome (IBS) 12/18/2011      Review of Systems  Constitutional: Negative for fever and chills.  Eyes: Positive for photophobia. Negative for visual disturbance.  Gastrointestinal: Positive for nausea (occasional).  Musculoskeletal: Negative for neck pain and neck stiffness.  Neurological: Positive for dizziness and headaches.  Psychiatric/Behavioral: Negative for sleep disturbance.  No daytime hypersomnolence or nonrestorative sleep despite snoring     Objective:   Physical Exam  Nursing note and vitals reviewed. Constitutional: He is oriented to person, place, and time. He appears well-developed and well-nourished. No distress.  HENT:  Head:  Normocephalic and atraumatic.  Eyes: Conjunctivae and EOM are normal. Pupils are equal, round, and reactive to light.  Neck: Normal range of motion. Neck supple. No thyromegaly present.  Cardiovascular: Normal rate, regular rhythm and normal heart sounds.   No murmur  heard. Pulmonary/Chest: Effort normal and breath sounds normal. No respiratory distress.  Musculoskeletal: Normal range of motion. He exhibits no edema.  Lymphadenopathy:    He has no cervical adenopathy.  Neurological: He is alert and oriented to person, place, and time. No cranial nerve deficit. He exhibits normal muscle tone. Coordination normal.  Skin: Skin is warm and dry.  Psychiatric: He has a normal mood and affect. His behavior is normal. Judgment and thought content normal.      BP 136/92  Pulse 86  Temp(Src) 98.4 F (36.9 C) (Oral)  Resp 16  Ht 5' 7.75" (1.721 m)  Wt 226 lb 3.2 oz (102.604 kg)  BMI 34.64 kg/m2  SpO2 97%     Assessment & Plan:  Intractable episodic tension-type headache - Plan: ondansetron (ZOFRAN) 8 MG tablet  Return to acupuncture [facility]  Start Topamax-watch for side effects  No change in current regimen which works over 50% of the time  Essential hypertension - Plan: CBC with Differential, Comprehensive metabolic panel, Lipid panel, bisoprolol-hydrochlorothiazide (ZIAC) 10-6.25 MG per tablet refilled  Hypogonadism male - Plan: TSH, PSA, Testosterone 20.25 MG/ACT (1.62%) GEL, Testosterone, free, total  Meds ordered this encounter  Medications  . DISCONTD: Testosterone 20.25 MG/ACT (1.62%) GEL    Sig: APPLY 4 PUMPS TOPICALLY DAILY. PATIENT NEEDS OFFICE VISIT FOR ADDITIONAL REFILLS  . Testosterone 20.25 MG/ACT (1.62%) GEL    Sig: APPLY 4 PUMPS TOPICALLY DAILY.    Dispense:  75 g    Refill:  5  . bisoprolol-hydrochlorothiazide (ZIAC) 10-6.25 MG per tablet    Sig: TAKE 1 TABLET BY MOUTH DAILY.    Dispense:  90 tablet    Refill:  3  . ondansetron (ZOFRAN) 8 MG tablet    Sig: TAKE 1 TABLET (8 MG TOTAL) BY MOUTH EVERY 8 (EIGHT) HOURS AS NEEDED.    Dispense:  20 tablet    Refill:  5  . topiramate (TOPAMAX) 25 MG tablet    Sig: Take 1 tablet (25 mg total) by mouth daily. after 1 week, increase to 2 hs.    Dispense:  60 tablet     Refill:  5  . butalbital-acetaminophen-caffeine (FIORICET WITH CODEINE) 50-325-40-30 MG per capsule    Sig: TAKE ONE CAPSULE BY MOUTH EVERY 4 HOURS AS NEEDED    Dispense:  30 capsule    Refill:  5    This request is for a new prescription for a controlled substance as required by Federal/State law.  Marland Kitchen HYDROcodone-acetaminophen (NORCO/VICODIN) 5-325 MG per tablet    Sig: TAKE 1 TABLET BY MOUTH EVERY 6 HOURS AS NEEDED    Dispense:  90 tablet    Refill:  0   Routine labs  I have completed the patient encounter in its entirety as documented by the scribe, with editing by me where necessary. Traevon Meiring P. Merla Riches, M.D.

## 2014-05-10 LAB — COMPREHENSIVE METABOLIC PANEL
ALK PHOS: 78 U/L (ref 39–117)
ALT: 37 U/L (ref 0–53)
AST: 25 U/L (ref 0–37)
Albumin: 4.6 g/dL (ref 3.5–5.2)
BILIRUBIN TOTAL: 0.3 mg/dL (ref 0.2–1.2)
BUN: 15 mg/dL (ref 6–23)
CO2: 27 meq/L (ref 19–32)
CREATININE: 0.84 mg/dL (ref 0.50–1.35)
Calcium: 9.6 mg/dL (ref 8.4–10.5)
Chloride: 100 mEq/L (ref 96–112)
GLUCOSE: 114 mg/dL — AB (ref 70–99)
Potassium: 4.1 mEq/L (ref 3.5–5.3)
Sodium: 140 mEq/L (ref 135–145)
Total Protein: 7.7 g/dL (ref 6.0–8.3)

## 2014-05-10 LAB — LIPID PANEL
CHOL/HDL RATIO: 4.9 ratio
Cholesterol: 204 mg/dL — ABNORMAL HIGH (ref 0–200)
HDL: 42 mg/dL (ref >39)
LDL CALC: 89 mg/dL (ref 0–99)
TRIGLYCERIDES: 364 mg/dL — AB (ref <150)
VLDL: 73 mg/dL — AB (ref 0–40)

## 2014-05-10 LAB — PSA: PSA: 0.73 ng/mL (ref <=4.00)

## 2014-05-10 LAB — TSH: TSH: 1.059 u[IU]/mL (ref 0.350–4.500)

## 2014-05-12 ENCOUNTER — Encounter: Payer: Self-pay | Admitting: Internal Medicine

## 2014-05-13 LAB — TESTOSTERONE, FREE, TOTAL, SHBG
Sex Hormone Binding: 16 nmol/L (ref 13–71)
TESTOSTERONE: 150 ng/dL — AB (ref 300–890)
Testosterone, Free: 40 pg/mL — ABNORMAL LOW (ref 47.0–244.0)
Testosterone-% Free: 2.7 % (ref 1.6–2.9)

## 2014-07-09 ENCOUNTER — Encounter: Payer: Self-pay | Admitting: Internal Medicine

## 2014-07-09 DIAGNOSIS — E291 Testicular hypofunction: Secondary | ICD-10-CM

## 2014-07-10 MED ORDER — TESTOSTERONE 20.25 MG/ACT (1.62%) TD GEL: TRANSDERMAL | Status: DC

## 2014-07-10 NOTE — Telephone Encounter (Signed)
Changed Meds ordered this encounter  Medications  . Testosterone 20.25 MG/ACT (1.62%) GEL    Sig: APPLY 4 PUMPS TOPICALLY DAILY.    Dispense:  150 g    Refill:  5

## 2014-07-30 ENCOUNTER — Other Ambulatory Visit: Payer: Self-pay

## 2014-07-30 MED ORDER — TOPIRAMATE 25 MG PO TABS: 25.0000 mg | ORAL_TABLET | Freq: Every day | ORAL | Status: DC

## 2014-08-06 ENCOUNTER — Telehealth: Payer: Self-pay

## 2014-08-06 NOTE — Telephone Encounter (Signed)
PA is needed for Androgel. I called [employer] and was advised that Androgel will only be covered after ALL three of the preferred meds have been tried (for at least 30 days ea) : Azucena FreedFortesta, Axiron, and Androderm. Dr Merla Richesoolittle, do you want to Rx one of these?

## 2014-08-12 NOTE — Telephone Encounter (Signed)
May replace with axiron-6 mo supply

## 2014-08-13 MED ORDER — TESTOSTERONE 30 MG/ACT TD SOLN: TRANSDERMAL | Status: DC

## 2014-08-13 NOTE — Telephone Encounter (Signed)
Faxed in Rx and notified pt of denial and change. Pt stated he has tried the Androderm before and had a reaction to it, but he is willing to try any of the pumps. He will let us know if he has any problems with it.

## 2014-08-15 ENCOUNTER — Telehealth: Payer: Self-pay

## 2014-08-15 NOTE — Telephone Encounter (Signed)
PA needed for Axiron. Completed form and faxed to [employer].

## 2014-08-19 NOTE — Telephone Encounter (Signed)
[facility] [employer] sent another form asking for their current form to be completed. Completed and faxed.

## 2014-08-21 NOTE — Telephone Encounter (Signed)
PA approved through 08/20/15. Notified pharm.

## 2014-10-27 ENCOUNTER — Other Ambulatory Visit: Payer: Self-pay | Admitting: Internal Medicine

## 2015-01-14 ENCOUNTER — Encounter: Payer: Self-pay | Admitting: Internal Medicine

## 2015-01-14 ENCOUNTER — Ambulatory Visit (INDEPENDENT_AMBULATORY_CARE_PROVIDER_SITE_OTHER): Payer: BLUE CROSS/BLUE SHIELD | Admitting: Internal Medicine

## 2015-01-14 VITALS — BP 125/87 | HR 83 | Temp 98.6°F | Resp 16 | Ht 67.75 in | Wt 217.0 lb

## 2015-01-14 DIAGNOSIS — Z23 Encounter for immunization: Secondary | ICD-10-CM

## 2015-01-14 DIAGNOSIS — N528 Other male erectile dysfunction: Secondary | ICD-10-CM | POA: Diagnosis not present

## 2015-01-14 DIAGNOSIS — K219 Gastro-esophageal reflux disease without esophagitis: Secondary | ICD-10-CM | POA: Diagnosis not present

## 2015-01-14 DIAGNOSIS — E291 Testicular hypofunction: Secondary | ICD-10-CM

## 2015-01-14 DIAGNOSIS — G43109 Migraine with aura, not intractable, without status migrainosus: Secondary | ICD-10-CM | POA: Diagnosis not present

## 2015-01-14 DIAGNOSIS — R739 Hyperglycemia, unspecified: Secondary | ICD-10-CM | POA: Diagnosis not present

## 2015-01-14 DIAGNOSIS — I10 Essential (primary) hypertension: Secondary | ICD-10-CM | POA: Diagnosis not present

## 2015-01-14 LAB — COMPREHENSIVE METABOLIC PANEL
ALBUMIN: 4.3 g/dL (ref 3.5–5.2)
ALT: 35 U/L (ref 0–53)
AST: 21 U/L (ref 0–37)
Alkaline Phosphatase: 84 U/L (ref 39–117)
BUN: 12 mg/dL (ref 6–23)
CALCIUM: 9.8 mg/dL (ref 8.4–10.5)
CHLORIDE: 103 meq/L (ref 96–112)
CO2: 28 mEq/L (ref 19–32)
CREATININE: 0.83 mg/dL (ref 0.50–1.35)
GLUCOSE: 104 mg/dL — AB (ref 70–99)
Potassium: 4.5 mEq/L (ref 3.5–5.3)
Sodium: 141 mEq/L (ref 135–145)
Total Bilirubin: 0.3 mg/dL (ref 0.2–1.2)
Total Protein: 7.4 g/dL (ref 6.0–8.3)

## 2015-01-14 LAB — TSH: TSH: 0.818 u[IU]/mL (ref 0.350–4.500)

## 2015-01-14 LAB — LIPID PANEL
CHOL/HDL RATIO: 5.7 ratio
CHOLESTEROL: 195 mg/dL (ref 0–200)
HDL: 34 mg/dL — AB (ref >=40)
LDL Cholesterol: 84 mg/dL (ref 0–99)
Triglycerides: 386 mg/dL — ABNORMAL HIGH (ref <150)
VLDL: 77 mg/dL — ABNORMAL HIGH (ref 0–40)

## 2015-01-14 LAB — HEMOGLOBIN A1C
Hgb A1c MFr Bld: 5.6 % (ref <5.7)
Mean Plasma Glucose: 114 mg/dL (ref <117)

## 2015-01-14 MED ORDER — TOPIRAMATE 100 MG PO TABS: 100.0000 mg | ORAL_TABLET | Freq: Every day | ORAL | Status: DC

## 2015-01-14 MED ORDER — BUTALBITAL-ASA-CAFF-CODEINE 50-325-40-30 MG PO CAPS: ORAL_CAPSULE | ORAL | Status: DC

## 2015-01-14 MED ORDER — TESTOSTERONE 20.25 MG/ACT (1.62%) TD GEL: TRANSDERMAL | Status: DC

## 2015-01-14 MED ORDER — BISOPROLOL-HYDROCHLOROTHIAZIDE 10-6.25 MG PO TABS: ORAL_TABLET | ORAL | Status: DC

## 2015-01-14 MED ORDER — TADALAFIL 2.5 MG PO TABS: ORAL_TABLET | ORAL | Status: DC

## 2015-01-14 MED ORDER — HYDROCODONE-ACETAMINOPHEN 5-325 MG PO TABS: ORAL_TABLET | ORAL | Status: DC

## 2015-01-14 NOTE — Progress Notes (Signed)
Subjective:    Patient ID: Steven Holland, male    DOB: [birth year], 44 y.o.   MRN: [id number]  HPI Chief Complaint  Patient presents with  . Medication Refill  . Hypertension  . Hypogonadism  . Headache   Patient Active Problem List   Diagnosis Date Noted  . Hypogonadism male--- has finally resumed acceptable testosterone replacement  12/18/2011  . ED (erectile dysfunction)--- daily Cialis has been helpful  12/18/2011  . HTN (hypertension)--- remains asymptomatic on medications  12/18/2011  . Migraine headache with aura---less intense but no chg freq at 50 topa Uses esg-cod for less int and HC for really severe 12/18/2011  . AR (allergic rhinitis)--- stable  12/18/2011  . GERD (gastroesophageal reflux disease)--stable  12/18/2011  . Irritable bowel syndrome (IBS)--stable  12/18/2011    Prior to Admission medications   Medication Sig Start Date End Date Taking? Authorizing Provider  bisoprolol-hydrochlorothiazide (ZIAC) 10-6.25 MG per tablet TAKE 1 TABLET BY MOUTH DAILY. 05/09/14  Yes Tonye Pearsonobert P Cereniti Curb, MD  butalbital-acetaminophen-caffeine (FIORICET WITH CODEINE) 612313725350-325-40-30 MG per capsule TAKE ONE CAPSULE BY MOUTH EVERY 4 HOURS AS NEEDED 05/09/14  Yes Tonye Pearsonobert P Gates Jividen, MD  butalbital-aspirin-caffeine-codeine (ASCOMP-CODEINE) 872-831-606550-325-40-30 MG capsule TAKE 1 CAPSULE BY MOUTH EVERY 4 HOURS AS NEEDED 08/07/13  Yes Tonye Pearsonobert P Zaki Gertsch, MD  cetirizine (ZYRTEC) 10 MG tablet Take 10 mg by mouth daily.   Yes Historical Provider, MD  CIALIS 2.5 MG TABS TAKE 1 TABLET (2.5 MG TOTAL) BY MOUTH DAILY.   Yes Tonye Pearsonobert P Sevon Rotert, MD  HYDROcodone-acetaminophen (NORCO/VICODIN) 5-325 MG per tablet TAKE 1 TABLET BY MOUTH EVERY 6 HOURS AS NEEDED 05/09/14  Yes Tonye Pearsonobert P Thoren Hosang, MD  Multiple Vitamin (MULITIVITAMIN WITH MINERALS) TABS Take 1 tablet by mouth daily.   Yes Historical Provider, MD  omeprazole (PRILOSEC) 10 MG capsule Take 10 mg by mouth daily.   Yes Historical Provider, MD  ondansetron  (ZOFRAN) 8 MG tablet TAKE 1 TABLET (8 MG TOTAL) BY MOUTH EVERY 8 (EIGHT) HOURS AS NEEDED. 05/09/14  Yes Tonye Pearsonobert P Lonni Dirden, MD  Pseudoephedrine HCl (SUDAFED PO) Take by mouth.   Yes Historical Provider, MD  Testosterone 20.25 MG/ACT (1.62%) GEL APPLY 4 PUMPS TOPICALLY DAILY. 07/10/14  Yes Tonye Pearsonobert P Gerre Ranum, MD  topiramate (TOPAMAX) 25 MG tablet Take 2 tablets (50 mg total) by mouth at bedtime. PATIENT NEEDS OFFICE VISIT FOR ADDITIONAL REFILLS 10/28/14  Yes Tonye Pearsonobert P Karn Derk, MD  Testosterone 30 MG/ACT SOLN Place 3 pumps onto the skin daily. Patient not taking: Reported on 01/14/2015 08/13/14   Tonye Pearsonobert P Jolon Degante, MD   9 lbs decr!!! Td 9 y ago--time to repeat Job promotion equal increase work but no change in pay Review of Systems  Constitutional: Negative for fever, activity change, appetite change, fatigue and unexpected weight change.  Eyes: Negative for visual disturbance.  Respiratory: Negative for shortness of breath.   Cardiovascular: Negative for chest pain, palpitations and leg swelling.  Gastrointestinal: Negative for abdominal pain.  Genitourinary: Negative for difficulty urinating.  Musculoskeletal: Negative for back pain.  Neurological: Negative for dizziness and syncope.  Psychiatric/Behavioral: Negative for behavioral problems, confusion, sleep disturbance, dysphoric mood and decreased concentration. The patient is not nervous/anxious.    he has concerns about no past history of mumps and whether he needs the mumps vaccine     Objective:   Physical Exam Wt Readings from Last 3 Encounters:  01/14/15 217 lb (98.431 kg)  05/09/14 226 lb 3.2 oz (102.604 kg)  08/07/13 220 lb 12.8 oz (100.154  kg)  BP 125/87 mmHg  Pulse 83  Temp(Src) 98.6 F (37 C) (Oral)  Resp 16  Ht 5' 7.75" (1.721 m)  Wt 217 lb (98.431 kg)  BMI 33.23 kg/m2  SpO2 98% HEENT clear Heart regular without murmur Extremities without edema/good peripheral pulses Neurological intact Mood good affect  appropriate        Assessment & Plan:  Hypogonadism male - Plan: PSA, Testosterone 20.25 MG/ACT (1.62%) GEL, Testosterone, Free, Total, SHBG, Mumps antibody, IgG, Testosterone, Free, Total, SHBG, CANCELED: Testosterone,Free and Total  Other male erectile dysfunction - Plan: Testosterone, Free, Total, SHBG, Testosterone, Free, Total, SHBG  Essential hypertension - Plan: CBC with Differential/Platelet, Comprehensive metabolic panel, Lipid panel, TSH, bisoprolol-hydrochlorothiazide (ZIAC) 10-6.25 MG per tablet  Migraine with aura and without status migrainosus, not intractable  Gastroesophageal reflux disease without esophagitis  Hyperglycemia - Plan: Hemoglobin A1c  Meds ordered this encounter  Medications  . Pseudoephedrine HCl (SUDAFED PO)    Sig: Take by mouth.  . bisoprolol-hydrochlorothiazide (ZIAC) 10-6.25 MG per tablet    Sig: TAKE 1 TABLET BY MOUTH DAILY.    Dispense:  90 tablet    Refill:  3  . topiramate (TOPAMAX) 100 MG tablet    Sig: Take 1 tablet (100 mg total) by mouth daily.    Dispense:  30 tablet    Refill:  11  . Testosterone 20.25 MG/ACT (1.62%) GEL    Sig: APPLY 4 PUMPS TOPICALLY DAILY.    Dispense:  150 g    Refill:  5  . Tadalafil (CIALIS) 2.5 MG TABS    Sig: TAKE 1 TABLET (2.5 MG TOTAL) BY MOUTH DAILY.    Dispense:  90 tablet    Refill:  3  . butalbital-aspirin-caffeine-codeine (ASCOMP-CODEINE) 50-325-40-30 MG capsule    Sig: TAKE 1 CAPSULE BY MOUTH EVERY 4 HOURS AS NEEDED    Dispense:  30 capsule    Refill:  5  . HYDROcodone-acetaminophen (NORCO/VICODIN) 5-325 MG per tablet    Sig: TAKE 1 TABLET BY MOUTH EVERY 6 HOURS AS NEEDED    Dispense:  90 tablet    Refill:  0    Mumps ab A1C Tdap

## 2015-01-15 ENCOUNTER — Encounter: Payer: Self-pay | Admitting: Internal Medicine

## 2015-01-15 LAB — MUMPS ANTIBODY, IGG: MUMPS IGG: 77.2 [AU]/ml — AB (ref <9.00)

## 2015-01-15 LAB — CBC WITH DIFFERENTIAL/PLATELET
Basophils Absolute: 0 10*3/uL (ref 0.0–0.1)
Basophils Relative: 0 % (ref 0–1)
EOS ABS: 0.1 10*3/uL (ref 0.0–0.7)
Eosinophils Relative: 1 % (ref 0–5)
HCT: 42.7 % (ref 39.0–52.0)
HEMOGLOBIN: 14.6 g/dL (ref 13.0–17.0)
LYMPHS ABS: 2.2 10*3/uL (ref 0.7–4.0)
Lymphocytes Relative: 22 % (ref 12–46)
MCH: 29.6 pg (ref 26.0–34.0)
MCHC: 34.2 g/dL (ref 30.0–36.0)
MCV: 86.6 fL (ref 78.0–100.0)
MPV: 9.2 fL (ref 8.6–12.4)
Monocytes Absolute: 0.5 10*3/uL (ref 0.1–1.0)
Monocytes Relative: 5 % (ref 3–12)
NEUTROS ABS: 7.1 10*3/uL (ref 1.7–7.7)
NEUTROS PCT: 72 % (ref 43–77)
Platelets: 275 10*3/uL (ref 150–400)
RBC: 4.93 MIL/uL (ref 4.22–5.81)
RDW: 13.9 % (ref 11.5–15.5)
WBC: 9.9 10*3/uL (ref 4.0–10.5)

## 2015-01-15 LAB — TESTOSTERONE, FREE, TOTAL, SHBG
Sex Hormone Binding: 13 nmol/L (ref 10–50)
TESTOSTERONE: 225 ng/dL — AB (ref 300–890)
Testosterone, Free: 66.1 pg/mL (ref 47.0–244.0)
Testosterone-% Free: 2.9 % (ref 1.6–2.9)

## 2015-01-15 LAB — PSA: PSA: 0.74 ng/mL (ref <=4.00)

## 2015-02-26 ENCOUNTER — Encounter: Payer: Self-pay | Admitting: Internal Medicine

## 2015-02-26 DIAGNOSIS — E291 Testicular hypofunction: Secondary | ICD-10-CM

## 2015-02-27 ENCOUNTER — Other Ambulatory Visit: Payer: Self-pay

## 2015-02-27 MED ORDER — TESTOSTERONE 20.25 MG/ACT (1.62%) TD GEL: TRANSDERMAL | Status: DC

## 2015-02-27 NOTE — Telephone Encounter (Signed)
Rx for Androgel called into pharmacy.

## 2015-08-15 ENCOUNTER — Other Ambulatory Visit: Payer: Self-pay | Admitting: Internal Medicine

## 2015-08-18 ENCOUNTER — Other Ambulatory Visit: Payer: Self-pay | Admitting: *Deleted

## 2015-08-18 NOTE — Telephone Encounter (Signed)
Prescription faxed

## 2015-08-18 NOTE — Telephone Encounter (Signed)
Rx sent 

## 2015-10-21 ENCOUNTER — Ambulatory Visit (INDEPENDENT_AMBULATORY_CARE_PROVIDER_SITE_OTHER): Payer: BLUE CROSS/BLUE SHIELD | Admitting: Internal Medicine

## 2015-10-21 ENCOUNTER — Encounter: Payer: Self-pay | Admitting: Internal Medicine

## 2015-10-21 VITALS — BP 124/85 | HR 76 | Temp 98.2°F | Resp 16 | Ht 67.5 in | Wt 218.4 lb

## 2015-10-21 DIAGNOSIS — N528 Other male erectile dysfunction: Secondary | ICD-10-CM | POA: Diagnosis not present

## 2015-10-21 DIAGNOSIS — E291 Testicular hypofunction: Secondary | ICD-10-CM

## 2015-10-21 DIAGNOSIS — G43109 Migraine with aura, not intractable, without status migrainosus: Secondary | ICD-10-CM | POA: Diagnosis not present

## 2015-10-21 DIAGNOSIS — I10 Essential (primary) hypertension: Secondary | ICD-10-CM

## 2015-10-21 DIAGNOSIS — K219 Gastro-esophageal reflux disease without esophagitis: Secondary | ICD-10-CM

## 2015-10-21 DIAGNOSIS — G44211 Episodic tension-type headache, intractable: Secondary | ICD-10-CM

## 2015-10-21 DIAGNOSIS — Z6833 Body mass index (BMI) 33.0-33.9, adult: Secondary | ICD-10-CM | POA: Diagnosis not present

## 2015-10-21 MED ORDER — TESTOSTERONE 20.25 MG/ACT (1.62%) TD GEL: TRANSDERMAL | Status: DC

## 2015-10-21 MED ORDER — HYDROCODONE-ACETAMINOPHEN 5-325 MG PO TABS: ORAL_TABLET | ORAL | Status: DC

## 2015-10-21 MED ORDER — TOPIRAMATE 25 MG PO TABS: 25.0000 mg | ORAL_TABLET | Freq: Two times a day (BID) | ORAL | Status: DC

## 2015-10-21 MED ORDER — TADALAFIL 2.5 MG PO TABS: ORAL_TABLET | ORAL | Status: DC

## 2015-10-21 MED ORDER — ONDANSETRON HCL 8 MG PO TABS: ORAL_TABLET | ORAL | Status: DC

## 2015-10-21 MED ORDER — OMEPRAZOLE 20 MG PO CPDR: 20.0000 mg | DELAYED_RELEASE_CAPSULE | Freq: Every day | ORAL | Status: DC

## 2015-10-21 MED ORDER — BUTALBITAL-APAP-CAFF-COD 50-325-40-30 MG PO CAPS: ORAL_CAPSULE | ORAL | Status: DC

## 2015-10-21 MED ORDER — HYDROCHLOROTHIAZIDE 12.5 MG PO CAPS: 12.5000 mg | ORAL_CAPSULE | Freq: Every day | ORAL | Status: DC

## 2015-10-21 NOTE — Progress Notes (Signed)
Subjective:    Patient ID: Steven Holland, male    DOB: February 17, 1971, 45 y.o.   MRN: [id number]  HPI  Patient Active Problem List   Diagnosis Date Noted  . Hypogonadism male--testos since 2012 12/18/2011  . ED (erectile dysfunction)--daily cialis since 2013//good but not great--wonders if BP meds could also be an issue 12/18/2011  . HTN (hypertension) 12/18/2011  . Migraine headache with aura Added topamax 2015--with some success at decr freq HAs,BUT recently felt side effects feet tingling and erectile dysfunction worsened so stopped--symptoms improved. Now back at  and will stay there//has benefited from acupuncture at times([facility])??current regimen with HC and or butalbital for rescue prevents need to miss work most of the time//zofran also 12/18/2011  . AR (allergic rhinitis) 12/18/2011  . GERD (gastroesophageal reflux disease)--omep 20 stable 12/18/2011  . Irritable bowel syndrome (IBS) 12/18/2011  --  incr triglycer--approached with diet and weight loss//no gain 10months  Broth/fath/gm/mom with joint replac--knees Father ht dz  Review of Systems  Constitutional: Negative for fatigue and unexpected weight change.  HENT: Negative for trouble swallowing.   Eyes: Negative for visual disturbance.  Respiratory: Negative for chest tightness and shortness of breath.   Cardiovascular: Negative for chest pain, palpitations and leg swelling.  Gastrointestinal: Negative for abdominal pain.  Genitourinary: Negative for difficulty urinating.  Musculoskeletal: Negative for arthralgias.  Neurological: Negative for dizziness.  Psychiatric/Behavioral: Negative for behavioral problems, sleep disturbance and dysphoric mood.       Objective:   Physical Exam Wt Readings from Last 3 Encounters:  10/21/15 218 lb 6.4 oz (99.066 kg)  01/14/15 217 lb (98.431 kg)  05/09/14 226 lb 3.2 oz (102.604 kg)  BP 124/85 mmHg  Pulse 76  Temp(Src) 98.2 F (36.8 C) (Oral)  Resp 16  Ht  5' 7.5" (1.715 m)  Wt 218 lb 6.4 oz (99.066 kg)  BMI 33.68 kg/m2  SpO2 97% HEENT clear Heart regular No edema/good peripheral pulses Neurological intact Mood good /affect appropriate      Assessment & Plan:  Hypogonadism male - Plan: Testosterone 20.25 MG/ACT (1.62%) GEL/lab values have been below the normal range but he has functional improvement over the last 4 years so the dose will not be changed  Migraine with aura and without status migrainosus, not intractable--occasional tension headaches -Continue Topamax daily/has other rescue medications for when necessary basis  Essential hypertension--change Ziac to Microzide 12.5 mg  Other male erectile dysfunction-Cialis daily//discontinued beta blocker  BMI 33.0-33.9,adult--the focus should be on weight loss to protect his joints, decrease his triglycerides, and improve his metabolic profile  GERD--omeprazole  Meds ordered this encounter  Medications  . hydrochlorothiazide (MICROZIDE) 12.5 MG capsule    Sig: Take 1 capsule (12.5 mg total) by mouth daily.    Dispense:  90 capsule    Refill:  3  . topiramate (TOPAMAX) 25 MG tablet    Sig: Take 1 tablet (25 mg total) by mouth 2 (two) times daily.    Dispense:  90 tablet    Refill:  3  . Tadalafil (CIALIS) 2.5 MG TABS    Sig: TAKE 1 TABLET (2.5 MG TOTAL) BY MOUTH DAILY.    Dispense:  90 tablet    Refill:  3  . ondansetron (ZOFRAN) 8 MG tablet    Sig: TAKE 1 TABLET (8 MG TOTAL) BY MOUTH EVERY 8 (EIGHT) HOURS AS NEEDED.    Dispense:  20 tablet    Refill:  5  . omeprazole (PRILOSEC) 20 MG capsule  Sig: Take 1 capsule (20 mg total) by mouth daily.    Dispense:  90 capsule    Refill:  3  . HYDROcodone-acetaminophen (NORCO/VICODIN) 5-325 MG tablet    Sig: TAKE 1 TABLET BY MOUTH EVERY 6 HOURS AS NEEDED    Dispense:  90 tablet    Refill:  0  . butalbital-acetaminophen-caffeine (FIORICET WITH CODEINE) 50-325-40-30 MG capsule    Sig: TAKE ONE CAPSULE BY MOUTH EVERY 4 HOURS AS  NEEDED    Dispense:  30 capsule    Refill:  5    This request is for a new prescription for a controlled substance as required by Federal/State law.  . Testosterone 20.25 MG/ACT (1.62%) GEL    Sig: APPLY 4 PUMPS TOPICALLY DAILY.    Dispense:  150 g    Refill:  5    Labs in 6 months to include PSA/triglycerides/testosterone Options for follow-up discussed

## 2015-10-22 DIAGNOSIS — E66811 Obesity, class 1: Secondary | ICD-10-CM | POA: Insufficient documentation

## 2015-10-22 DIAGNOSIS — E669 Obesity, unspecified: Secondary | ICD-10-CM | POA: Insufficient documentation

## 2015-11-10 ENCOUNTER — Other Ambulatory Visit: Payer: Self-pay

## 2015-11-10 DIAGNOSIS — E291 Testicular hypofunction: Secondary | ICD-10-CM

## 2015-11-10 MED ORDER — TESTOSTERONE 20.25 MG/ACT (1.62%) TD GEL: TRANSDERMAL | Status: DC

## 2015-11-10 NOTE — Telephone Encounter (Signed)
Pharm faxed req to change to 90 day fills of testosterone. Since controlled, I wanted to check with you, Dr Merla Richesoolittle, bf calling it in as 90 day RFs. Pended.

## 2015-11-10 NOTE — Telephone Encounter (Signed)
Faxed

## 2015-12-21 ENCOUNTER — Other Ambulatory Visit: Payer: Self-pay | Admitting: Internal Medicine

## 2015-12-22 ENCOUNTER — Other Ambulatory Visit: Payer: Self-pay | Admitting: *Deleted

## 2015-12-22 DIAGNOSIS — G43109 Migraine with aura, not intractable, without status migrainosus: Secondary | ICD-10-CM

## 2015-12-22 DIAGNOSIS — I10 Essential (primary) hypertension: Secondary | ICD-10-CM

## 2015-12-22 MED ORDER — TOPIRAMATE 25 MG PO TABS: 25.0000 mg | ORAL_TABLET | Freq: Two times a day (BID) | ORAL | Status: DC

## 2016-03-11 ENCOUNTER — Encounter (HOSPITAL_COMMUNITY): Payer: Self-pay

## 2016-03-11 ENCOUNTER — Ambulatory Visit (HOSPITAL_COMMUNITY)
Admission: EM | Admit: 2016-03-11 | Discharge: 2016-03-11 | Disposition: A | Discharge status: Home / Self Care | Payer: Self-pay | Attending: Family Medicine | Admitting: Family Medicine

## 2016-03-11 DIAGNOSIS — T148XXA Other injury of unspecified body region, initial encounter: Secondary | ICD-10-CM

## 2016-03-11 DIAGNOSIS — T148 Other injury of unspecified body region: Secondary | ICD-10-CM

## 2016-03-11 DIAGNOSIS — R51 Headache: Secondary | ICD-10-CM

## 2016-03-11 DIAGNOSIS — S161XXA Strain of muscle, fascia and tendon at neck level, initial encounter: Secondary | ICD-10-CM

## 2016-03-11 DIAGNOSIS — R519 Headache, unspecified: Secondary | ICD-10-CM

## 2016-03-11 NOTE — Discharge Instructions (Signed)
Muscle Strain Apply heat to the neck and perform stretches as demonstrated. Take your medicines at home as directed. A muscle strain (pulled muscle) happens when a muscle is stretched beyond normal length. It happens when a sudden, violent force stretches your muscle too far. Usually, a few of the fibers in your muscle are torn. Muscle strain is common in athletes. Recovery usually takes 1-2 weeks. Complete healing takes 5-6 weeks.  HOME CARE   Follow the PRICE method of treatment to help your injury get better. Do this the first 2-3 days after the injury:  Protect. Protect the muscle to keep it from getting injured again.  Rest. Limit your activity and rest the injured body part.  Ice. Put ice in a plastic bag. Place a towel between your skin and the bag. Then, apply the ice and leave it on from 15-20 minutes each hour. After the third day, switch to moist heat packs.  Compression. Use a splint or elastic bandage on the injured area for comfort. Do not put it on too tightly.  Elevate. Keep the injured body part above the level of your heart.  Only take medicine as told by your doctor.  Warm up before doing exercise to prevent future muscle strains. GET HELP IF:   You have more pain or puffiness (swelling) in the injured area.  You feel numbness, tingling, or notice a loss of strength in the injured area. MAKE SURE YOU:   Understand these instructions.  Will watch your condition.  Will get help right away if you are not doing well or get worse.   This information is not intended to replace advice given to you by your health care provider. Make sure you discuss any questions you have with your health care provider.   Document Released: 05/31/2008 Document Revised: 06/12/2013 Document Reviewed: 03/21/2013 Elsevier Interactive Patient Education [employer]2016 [employer]
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
## 2016-07-05 ENCOUNTER — Ambulatory Visit (INDEPENDENT_AMBULATORY_CARE_PROVIDER_SITE_OTHER): Payer: BLUE CROSS/BLUE SHIELD | Admitting: Urgent Care

## 2016-07-05 ENCOUNTER — Encounter: Payer: Self-pay | Admitting: Urgent Care

## 2016-07-05 VITALS — BP 130/84 | HR 120 | Temp 98.2°F | Resp 17 | Ht 67.5 in | Wt 221.0 lb

## 2016-07-05 DIAGNOSIS — E291 Testicular hypofunction: Secondary | ICD-10-CM

## 2016-07-05 DIAGNOSIS — G44211 Episodic tension-type headache, intractable: Secondary | ICD-10-CM | POA: Diagnosis not present

## 2016-07-05 DIAGNOSIS — K219 Gastro-esophageal reflux disease without esophagitis: Secondary | ICD-10-CM | POA: Diagnosis not present

## 2016-07-05 DIAGNOSIS — I10 Essential (primary) hypertension: Secondary | ICD-10-CM

## 2016-07-05 DIAGNOSIS — N529 Male erectile dysfunction, unspecified: Secondary | ICD-10-CM

## 2016-07-05 DIAGNOSIS — G43809 Other migraine, not intractable, without status migrainosus: Secondary | ICD-10-CM | POA: Diagnosis not present

## 2016-07-05 LAB — COMPLETE METABOLIC PANEL WITH GFR
ALT: 28 U/L (ref 9–46)
AST: 23 U/L (ref 10–40)
Albumin: 4.5 g/dL (ref 3.6–5.1)
Alkaline Phosphatase: 73 U/L (ref 40–115)
BUN: 11 mg/dL (ref 7–25)
CHLORIDE: 102 mmol/L (ref 98–110)
CO2: 27 mmol/L (ref 20–31)
Calcium: 9.8 mg/dL (ref 8.6–10.3)
Creat: 1 mg/dL (ref 0.60–1.35)
GFR, EST NON AFRICAN AMERICAN: 89 mL/min (ref >=60)
Glucose, Bld: 93 mg/dL (ref 65–99)
POTASSIUM: 4.2 mmol/L (ref 3.5–5.3)
SODIUM: 140 mmol/L (ref 135–146)
Total Bilirubin: 0.3 mg/dL (ref 0.2–1.2)
Total Protein: 7.7 g/dL (ref 6.1–8.1)

## 2016-07-05 MED ORDER — TOPIRAMATE 25 MG PO TABS: 25.0000 mg | ORAL_TABLET | Freq: Two times a day (BID) | ORAL | 3 refills | Status: DC

## 2016-07-05 MED ORDER — ONDANSETRON HCL 8 MG PO TABS: ORAL_TABLET | ORAL | 11 refills | Status: DC

## 2016-07-05 MED ORDER — OMEPRAZOLE 20 MG PO CPDR: 20.0000 mg | DELAYED_RELEASE_CAPSULE | Freq: Every day | ORAL | 3 refills | Status: DC

## 2016-07-05 MED ORDER — TADALAFIL 2.5 MG PO TABS: ORAL_TABLET | ORAL | 3 refills | Status: DC

## 2016-07-05 MED ORDER — BUTALBITAL-APAP-CAFF-COD 50-325-40-30 MG PO CAPS: ORAL_CAPSULE | ORAL | 0 refills | Status: DC

## 2016-07-05 MED ORDER — TESTOSTERONE 20.25 MG/ACT (1.62%) TD GEL: TRANSDERMAL | 5 refills | Status: DC

## 2016-07-05 MED ORDER — HYDROCHLOROTHIAZIDE 12.5 MG PO CAPS: 12.5000 mg | ORAL_CAPSULE | Freq: Every day | ORAL | 3 refills | Status: DC

## 2016-07-05 NOTE — Patient Instructions (Addendum)
Testosterone injection What is this medicine? TESTOSTERONE (tes TOS ter one) is the main male hormone. It supports normal male development such as muscle growth, facial hair, and deep voice. It is used in males to treat low testosterone levels. This medicine may be used for other purposes; ask your health care provider or pharmacist if you have questions. What should I tell my health care provider before I take this medicine? They need to know if you have any of these conditions: -breast cancer -diabetes -heart disease -kidney disease -liver disease -lung disease -prostate cancer, enlargement -an unusual or allergic reaction to testosterone, other medicines, foods, dyes, or preservatives -pregnant or trying to get pregnant -breast-feeding How should I use this medicine? This medicine is for injection into a muscle. It is usually given by a health care professional in a hospital or clinic setting. Contact your pediatrician regarding the use of this medicine in children. While this medicine may be prescribed for children as young as 12 years of age for selected conditions, precautions do apply. Overdosage: If you think you have taken too much of this medicine contact a poison control center or emergency room at once. NOTE: This medicine is only for you. Do not share this medicine with others. What if I miss a dose? Try not to miss a dose. Your doctor or health care professional will tell you when your next injection is due. Notify the office if you are unable to keep an appointment. What may interact with this medicine? -medicines for diabetes -medicines that treat or prevent blood clots like warfarin -oxyphenbutazone -propranolol -steroid medicines like prednisone or cortisone This list may not describe all possible interactions. Give your health care provider a list of all the medicines, herbs, non-prescription drugs, or dietary supplements you use. Also tell them if you smoke, drink  alcohol, or use illegal drugs. Some items may interact with your medicine. What should I watch for while using this medicine? Visit your doctor or health care professional for regular checks on your progress. They will need to check the level of testosterone in your blood. This medicine is only approved for use in men who have low levels of testosterone related to certain medical conditions. Heart attacks and strokes have been reported with the use of this medicine. Notify your doctor or health care professional and seek emergency treatment if you develop breathing problems; changes in vision; confusion; chest pain or chest tightness; sudden arm pain; severe, sudden headache; trouble speaking or understanding; sudden numbness or weakness of the face, arm or leg; loss of balance or coordination. Talk to your doctor about the risks and benefits of this medicine. This medicine may affect blood sugar levels. If you have diabetes, check with your doctor or health care professional before you change your diet or the dose of your diabetic medicine. This drug is banned from use in athletes by most athletic organizations. What side effects may I notice from receiving this medicine? Side effects that you should report to your doctor or health care professional as soon as possible: -allergic reactions like skin rash, itching or hives, swelling of the face, lips, or tongue -breast enlargement -breathing problems -changes in mood, especially anger, depression, or rage -dark urine -general ill feeling or flu-like symptoms -light-colored stools -loss of appetite, nausea -nausea, vomiting -right upper belly pain -stomach pain -swelling of ankles -too frequent or persistent erections -trouble passing urine or change in the amount of urine -unusually weak or tired -yellowing of the eyes or   skin Additional side effects that can occur in women include: -deep or hoarse voice -facial hair growth -irregular  menstrual periods Side effects that usually do not require medical attention (report to your doctor or health care professional if they continue or are bothersome): -acne -change in sex drive or performance -hair loss -headache This list may not describe all possible side effects. Call your doctor for medical advice about side effects. You may report side effects to FDA at 1-800-FDA-1088. Where should I keep my medicine? Keep out of the reach of children. This medicine can be abused. Keep your medicine in a safe place to protect it from theft. Do not share this medicine with anyone. Selling or giving away this medicine is dangerous and against the law. Store at room temperature between 20 and 25 degrees C (68 and 77 degrees F). Do not freeze. Protect from light. Follow the directions for the product you are prescribed. Throw away any unused medicine after the expiration date. NOTE: This sheet is a summary. It may not cover all possible information. If you have questions about this medicine, talk to your doctor, pharmacist, or health care provider.    2016, Elsevier/Gold Standard. (2013-11-07 [id number])    Acetaminophen; Butalbital; Caffeine; Codeine capsules What is this medicine? ACETAMINOPHEN; BUTALBITAL; CAFFEINE; CODEINE (a set a MEE noe fen; byoo TAL bi tal; KAF een; KOE deen) is a pain reliever. It is used to treat tension headaches. This medicine may be used for other purposes; ask your health care provider or pharmacist if you have questions. What should I tell my health care provider before I take this medicine? They need to know if you have any of these conditions: -breathing problems -drug abuse or addiction -if you often drink alcohol -liver disease -intestinal problem -porphyria -recent head injury -an unusual or allergic reaction to acetaminophen, butalbital or other barbiturates, caffeine, codeine, other medicines, foods, dyes, or preservatives -pregnant or trying to get  pregnant -breast-feeding How should I use this medicine? Take this medicine by mouth with a full glass of water. Follow the directions on the prescription label. Do not take your medicine more often than directed. Talk to your pediatrician regarding the use of this medicine in children. Special care may be needed. Patients over 47 years old may have a stronger reaction and need a smaller dose. Overdosage: If you think you have taken too much of this medicine contact a poison control center or emergency room at once. NOTE: This medicine is only for you. Do not share this medicine with others. What if I miss a dose? If you miss a dose, take it as soon as you can. If it is almost time for your next dose, take only that dose. Do not take double or extra doses. What may interact with this medicine? Do not take this medicine with any of the following medications: -alcohol -linezolid -MAOIs like Carbex, Eldepryl, Marplan, Nardil, and Parnate -narcotic pain relievers -procarbazine -voriconazole This medicine may also interact with the following medications: -antihistamines for allergy, cough and cold -barbiturates like phenobarbital -naltrexone -medicines for depression, anxiety, or psychotic disturbances -medicines for sleep -medicines used for sleep during surgery -muscle relaxers -other medicines with acetaminophen This list may not describe all possible interactions. Give your health care provider a list of all the medicines, herbs, non-prescription drugs, or dietary supplements you use. Also tell them if you smoke, drink alcohol, or use illegal drugs. Some items may interact with your medicine. What should I watch for while  using this medicine? Tell your doctor or health care professional if your pain does not go away, if it gets worse, or if you have new or a different type of pain. You may develop tolerance to the medicine. Tolerance means that you will need a higher dose of the medicine  for pain relief. Tolerance is normal and is expected if you take the medicine for a long time. Do not suddenly stop taking your medicine because you may develop a severe reaction. Your body becomes used to the medicine. This does NOT mean you are addicted. Addiction is a behavior related to getting and using a drug for a non-medical reason. If you have pain, you have a medical reason to take pain medicine. Your doctor will tell you how much medicine to take. If your doctor wants you to stop the medicine, the dose will be slowly lowered over time to avoid any side effects. You may get drowsy or dizzy. Do not drive, use machinery, or do anything that needs mental alertness until you know how this medicine affects you. Do not stand or sit up quickly, especially if you are an older patient. This reduces the risk of dizzy or fainting spells. Alcohol may interfere with the effect of this medicine. Avoid alcoholic drinks. Children may be at higher risk for side effects. If your child has slow breathing, noisy breathing, confusion, or unusual sleepiness, stop giving this medicine and get medical help right away. This medicine may cause constipation. Try to have a bowel movement at least every 2 to 3 days. If you do not have a bowel movement for 3 days, call your doctor or health care professional. Do not take other medicines that contain acetaminophen with this medicine. Always read labels carefully. If you have questions, ask your doctor or pharmacist. If you take too much acetaminophen get medical help right away. Too much acetaminophen can be very dangerous and cause liver damage. Even if you do not have symptoms, it is important to get help right away. What side effects may I notice from receiving this medicine? Side effects that you should report to your doctor or health care professional as soon as possible: -allergic reactions like skin rash, itching or hives, swelling of the face, lips, or tongue -anxiety,  irritable, excited -difficulty passing urine -breathing problems -confusion -fast, irregular heartbeat -feeling faint or lightheaded -redness, blistering, peeling or loosening of the skin, including inside the mouth -seizures -yellowing of the eyes or skin Side effects that usually do not require medical attention (report to your doctor or health care professional if they continue or are bothersome): -constipation -drowsiness, dizziness -dry mouth -headache -nausea, vomiting -passing urine more often -stomach pain This list may not describe all possible side effects. Call your doctor for medical advice about side effects. You may report side effects to FDA at 1-800-FDA-1088. Where should I keep my medicine? Keep out of the reach of children. This medicine can be abused. Keep your medicine in a safe place to protect it from theft. Do not share this medicine with anyone. Selling or giving away this medicine is dangerous and against the law. This medicine may cause accidental overdose and death if it taken by other adults, children, or pets. Mix any unused medicine with a substance like cat litter or coffee grounds. Then throw the medicine away in a sealed container like a sealed bag or a coffee can with a lid. Do not use the medicine after the expiration date. Store at room  temperature between 15 and 30 degrees C (59 and 86 degrees F). NOTE: This sheet is a summary. It may not cover all possible information. If you have questions about this medicine, talk to your doctor, pharmacist, or health care provider.    2016, Elsevier/Gold Standard. (2014-03-10 13:25:51)    Tadalafil tablets (Cialis) What is this medicine? TADALAFIL (tah DA la fil) is used to treat erection problems in men. It is also used for enlargement of the prostate gland in men, a condition called benign prostatic hyperplasia or BPH. This medicine improves urine flow and reduces BPH symptoms. This medicine can also treat  both erection problems and BPH when they occur together. This medicine may be used for other purposes; ask your health care provider or pharmacist if you have questions. What should I tell my health care provider before I take this medicine? They need to know if you have any of these conditions: -bleeding disorders -eye or vision problems, including a rare inherited eye disease called retinitis pigmentosa -anatomical deformation of the penis, Peyronie's disease, or history of priapism (painful and prolonged erection) -heart disease, angina, a history of heart attack, irregular heart beats, or other heart problems -high or low blood pressure -history of blood diseases, like sickle cell anemia or leukemia -history of stomach bleeding -kidney disease -liver disease -stroke -an unusual or allergic reaction to tadalafil, other medicines, foods, dyes, or preservatives -pregnant or trying to get pregnant -breast-feeding How should I use this medicine? Take this medicine by mouth with a glass of water. Follow the directions on the prescription label. You may take this medicine with or without meals. When this medicine is used for erection problems, your doctor may prescribe it to be taken once daily or as needed. If you are taking the medicine as needed, you may be able to have sexual activity 30 minutes after taking it and for up to 36 hours after taking it. Whether you are taking the medicine as needed or once daily, you should not take more than one dose per day. If you are taking this medicine for symptoms of benign prostatic hyperplasia (BPH) or to treat both BPH and an erection problem, take the dose once daily at about the same time each day. Do not take your medicine more often than directed. Talk to your pediatrician regarding the use of this medicine in children. Special care may be needed. Overdosage: If you think you have taken too much of this medicine contact a poison control center or  emergency room at once. NOTE: This medicine is only for you. Do not share this medicine with others. What if I miss a dose? If you are taking this medicine as needed for erection problems, this does not apply. If you miss a dose while taking this medicine once daily for an erection problem, benign prostatic hyperplasia, or both, take it as soon as you remember, but do not take more than one dose per day. What may interact with this medicine? Do not take this medicine with any of the following medications: -nitrates like amyl nitrite, isosorbide dinitrate, isosorbide mononitrate, nitroglycerin -other medicines for erectile dysfunction like avanafil, sildenafil, vardenafil -other tadalafil products (Adcirca) -riociguat This medicine may also interact with the following medications: -certain drugs for high blood pressure -certain drugs for the treatment of HIV infection or AIDS -certain drugs used for fungal or yeast infections, like fluconazole, itraconazole, ketoconazole, and voriconazole -certain drugs used for seizures like carbamazepine, phenytoin, and phenobarbital -grapefruit juice -macrolide antibiotics  like clarithromycin, erythromycin, troleandomycin -medicines for prostate problems -rifabutin, rifampin or rifapentine This list may not describe all possible interactions. Give your health care provider a list of all the medicines, herbs, non-prescription drugs, or dietary supplements you use. Also tell them if you smoke, drink alcohol, or use illegal drugs. Some items may interact with your medicine. What should I watch for while using this medicine? If you notice any changes in your vision while taking this drug, call your doctor or health care professional as soon as possible. Stop using this medicine and call your health care provider right away if you have a loss of sight in one or both eyes. Contact your doctor or health care professional right away if the erection lasts longer than  4 hours or if it becomes painful. This may be a sign of serious problem and must be treated right away to prevent permanent damage. If you experience symptoms of nausea, dizziness, chest pain or arm pain upon initiation of sexual activity after taking this medicine, you should refrain from further activity and call your doctor or health care professional as soon as possible. Do not drink alcohol to excess (examples, 5 glasses of wine or 5 shots of whiskey) when taking this medicine. When taken in excess, alcohol can increase your chances of getting a headache or getting dizzy, increasing your heart rate or lowering your blood pressure. Using this medicine does not protect you or your partner against HIV infection (the virus that causes AIDS) or other sexually transmitted diseases. What side effects may I notice from receiving this medicine? Side effects that you should report to your doctor or health care professional as soon as possible: -allergic reactions like skin rash, itching or hives, swelling of the face, lips, or tongue -breathing problems -changes in hearing -changes in vision -chest pain -fast, irregular heartbeat -prolonged or painful erection -seizures Side effects that usually do not require medical attention (report to your doctor or health care professional if they continue or are bothersome): -back pain -dizziness -flushing -headache -indigestion -muscle aches -nausea -stuffy or runny nose This list may not describe all possible side effects. Call your doctor for medical advice about side effects. You may report side effects to FDA at 1-800-FDA-1088. Where should I keep my medicine? Keep out of the reach of children. Store at room temperature between 15 and 30 degrees C (59 and 86 degrees F). Throw away any unused medicine after the expiration date. NOTE: This sheet is a summary. It may not cover all possible information. If you have questions about this medicine, talk to  your doctor, pharmacist, or health care provider.    2016, Elsevier/Gold Standard. (2014-01-10 13:15:49)     IF you received an x-ray today, you will receive an invoice from [facility]. Please contact [facility] at [phone] with questions or concerns regarding your invoice.   IF you received labwork today, you will receive an invoice from [employer]. Please contact [facility] at [phone] with questions or concerns regarding your invoice.   Our billing staff will not be able to assist you with questions regarding bills from these companies.  You will be contacted with the lab results as soon as they are available. The fastest way to get your results is to activate your My Chart account. Instructions are located on the last page of this paperwork. If you have not heard from [place] regarding the results in 2 weeks, please contact this office.

## 2016-07-05 NOTE — Progress Notes (Addendum)
By signing my name below, I, Mesha Guinyard, attest that this documentation has been prepared under the direction and in the presence of Wallis BambergMario Mani, [place]PA-C.  Electronically Signed: Arvilla MarketMesha Guinyard, Medical Scribe. 07/05/16. 4:05 PM.  MRN: [id number] DOB: 09/28/1970  Subjective:   Steven HatchDarrell A Altland is a 45 y.o. male presenting for chief complaint of medication refill on ascomp-codeine, microzide, vicodin, prilosec, zofran, cialis, testosterone, and topamax.  HTN: Reports compliance with HCTZ. Checks his bp periodically, systolic bp ranges around 130. Denies chest pain, SOB, palpitations, abdominal pain, hematuria, N/V, lower leg edema and dizziness.  HAs: HAs vary in symptoms of: pain coming from his neck and back of his head, pain over temporal head bilaterally or frontal with pressure behind both eyes. Has associated photophobia, phonophobia, occasional dizziness and nausea. Can go a week without HA and 90% of the time it's triggered by change in weather. Takes fioricet for his HAs (this alone works most of the time) and when it's not working he'll take vicodin with it. Topamax helps reduce the frequency and intensity of his HAs but he takes a low dose or the tingling in his hands and feet mess with his libido. Used to take cetirizine and Sudafed daily until his wife was told to stop the similar regimen since they are told similar information about their symptoms. Has taken flonase intermittently in the past for sinus pressure. Has also failed imitrex, maxalt, and other migraine medications. Has not seen a neurologist. Does not smoke cigarettes.    GERD: Takes prilosec, GERD is well controlled with this. Denies experiencing any negative side effects while on this medication.  Nausea: Reports syncopal episodes when he gets nausea from milk and takes zofran for symptoms.  Hypogonadism: Takes testosterone 20.25 mg/act in the morning. Denies irritability, changes in mood, aggression. Denies ROS as  above.  Erectile Dysfunction: Takes cialis. Helps significantly with ED. Has had trouble with his wife in the past for this.   Current Outpatient Prescriptions:    ASCOMP-CODEINE 50-325-40-30 MG capsule, TAKE 1 CAPSULE BY MOUTH EVERY 4 HOURS AS NEEDED, Disp: 30 capsule, Rfl: 2   butalbital-acetaminophen-caffeine (FIORICET WITH CODEINE) 50-325-40-30 MG capsule, TAKE ONE CAPSULE BY MOUTH EVERY 4 HOURS AS NEEDED, Disp: 30 capsule, Rfl: 5   hydrochlorothiazide (MICROZIDE) 12.5 MG capsule, Take 1 capsule (12.5 mg total) by mouth daily., Disp: 90 capsule, Rfl: 3   HYDROcodone-acetaminophen (NORCO/VICODIN) 5-325 MG tablet, TAKE 1 TABLET BY MOUTH EVERY 6 HOURS AS NEEDED, Disp: 90 tablet, Rfl: 0   Multiple Vitamin (MULITIVITAMIN WITH MINERALS) TABS, Take 1 tablet by mouth daily., Disp: , Rfl:    omeprazole (PRILOSEC) 20 MG capsule, Take 1 capsule (20 mg total) by mouth daily., Disp: 90 capsule, Rfl: 3   ondansetron (ZOFRAN) 8 MG tablet, TAKE 1 TABLET (8 MG TOTAL) BY MOUTH EVERY 8 (EIGHT) HOURS AS NEEDED., Disp: 20 tablet, Rfl: 5   Tadalafil (CIALIS) 2.5 MG TABS, TAKE 1 TABLET (2.5 MG TOTAL) BY MOUTH DAILY., Disp: 90 tablet, Rfl: 3   Testosterone 20.25 MG/ACT (1.62%) GEL, APPLY 4 PUMPS TOPICALLY DAILY., Disp: 450 g, Rfl: 1   topiramate (TOPAMAX) 25 MG tablet, Take 1 tablet (25 mg total) by mouth 2 (two) times daily., Disp: 90 tablet, Rfl: 3  Savyon is allergic to sulfa antibiotics.  Steven Holland  has a past medical history of Hypertension and Low testosterone. Also  has no past surgical history on file.  Objective:   Vitals: BP 130/84 (BP Location: Right Arm, Patient Position: Sitting,  Cuff Size: Large)    Pulse (!) 120    Temp 98.2 F (36.8 C) (Oral)    Resp 17    Ht 5' 7.5" (1.715 m)    Wt 221 lb (100.2 kg)    SpO2 96%    BMI 34.10 kg/m   HTN: BP Readings from Last 3 Encounters:  07/05/16 130/84  03/11/16 139/100  10/21/15 124/85   Physical Exam  Constitutional: He is oriented to  person, place, and time. He appears well-developed and well-nourished.  Abdominal obesity  HENT:  Mouth/Throat: Oropharynx is clear and moist.  Eyes: Pupils are equal, round, and reactive to light. No scleral icterus.  Neck: No thyromegaly present.  Cardiovascular: Normal rate, regular rhythm and intact distal pulses.  Exam reveals no gallop and no friction rub.   No murmur heard. Pulmonary/Chest: No respiratory distress. He has no wheezes. He has no rales.  Abdominal: Soft. He exhibits no mass. There is no tenderness. There is no guarding. No hernia.  Musculoskeletal: He exhibits no edema (lower leg).  Neurological: He is alert and oriented to person, place, and time.   Assessment and Plan :   1. Other migraine without status migrainosus, not intractable 2. Intractable episodic tension-type headache - Referral to Neurology, refilled Fioricet. Okay to refill in the future. Patient refused propranolol. I refused to refill hydrocodone for headaches.   3. Essential hypertension - Refilled HCTZ, stable. Labs pending.  4. Hypogonadism male 5. Erectile dysfunction - Refilled testosterone injections, patient agreed to rtc for am level of his testosterone. Okay to refill in 1 month if patient has returned to clinic for his testosterone levels. Otherwise, patient needs to rtc in the am for this. I discussed the fact that HTN, testosterone pose a risk to him. He verbalized understanding and reports that his ED affects him more. Refilled his Cialis. Patient acknowledged potential for adverse effects with Cialis.  6. Gastroesophageal reflux disease without esophagitis - Refilled Prilosec  Wallis BambergMario Mani, PA-C [facility] [phone] 07/05/2016 4:05 PM

## 2016-07-06 LAB — MICROALBUMIN, URINE: Microalb, Ur: 1.6 mg/dL

## 2016-08-09 ENCOUNTER — Telehealth: Payer: Self-pay | Admitting: Urgent Care

## 2016-08-09 NOTE — Telephone Encounter (Signed)
Please call patient and remind him that he needs to come in for his testosterone check if he plans on continuing refills for this.

## 2016-08-11 NOTE — Telephone Encounter (Signed)
Lm to come in for labs related to rx monitoring

## 2016-11-04 ENCOUNTER — Encounter: Payer: Self-pay | Admitting: Family Medicine

## 2016-11-04 ENCOUNTER — Ambulatory Visit (INDEPENDENT_AMBULATORY_CARE_PROVIDER_SITE_OTHER): Payer: BLUE CROSS/BLUE SHIELD | Admitting: Family Medicine

## 2016-11-04 DIAGNOSIS — E291 Testicular hypofunction: Secondary | ICD-10-CM

## 2016-11-04 DIAGNOSIS — N528 Other male erectile dysfunction: Secondary | ICD-10-CM | POA: Diagnosis not present

## 2016-11-04 DIAGNOSIS — E785 Hyperlipidemia, unspecified: Secondary | ICD-10-CM | POA: Diagnosis not present

## 2016-11-04 DIAGNOSIS — K219 Gastro-esophageal reflux disease without esophagitis: Secondary | ICD-10-CM

## 2016-11-04 DIAGNOSIS — J3089 Other allergic rhinitis: Secondary | ICD-10-CM

## 2016-11-04 DIAGNOSIS — K589 Irritable bowel syndrome without diarrhea: Secondary | ICD-10-CM | POA: Diagnosis not present

## 2016-11-04 DIAGNOSIS — I10 Essential (primary) hypertension: Secondary | ICD-10-CM | POA: Diagnosis not present

## 2016-11-04 DIAGNOSIS — G43109 Migraine with aura, not intractable, without status migrainosus: Secondary | ICD-10-CM

## 2016-11-04 DIAGNOSIS — G43809 Other migraine, not intractable, without status migrainosus: Secondary | ICD-10-CM

## 2016-11-04 DIAGNOSIS — G44211 Episodic tension-type headache, intractable: Secondary | ICD-10-CM

## 2016-11-04 LAB — TESTOSTERONE: Testosterone: 432.46 ng/dL (ref 300.00–890.00)

## 2016-11-04 MED ORDER — BUTALBITAL-APAP-CAFF-COD 50-325-40-30 MG PO CAPS: ORAL_CAPSULE | ORAL | 0 refills | Status: DC
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
## 2016-11-04 NOTE — Assessment & Plan Note (Signed)
S: controlled on hctz 25mg . Slight HA today and BP up some.  BP Readings from Last 3 Encounters:  11/04/16 124/84  07/05/16 130/84  03/11/16 139/100  A/P:Continue current meds:  May need beta blocker for HA

## 2016-11-04 NOTE — Assessment & Plan Note (Signed)
S: IBS symptoms with severe nausea and abdominal cramping. States has passed out from severe nausea in the past. States had cardiac workup and that was negative. States trigger 4 oz of milk or more- but he loves milk. Has some other triggers. zofran tends to help. Apparently donnatal for cramping in past A/P: continue zofran. May try plain hyoscyamine in future.

## 2016-11-04 NOTE — Patient Instructions (Addendum)
Please stop by lab before you go  We will call you within a week or two about your referral to neurology. If you do not hear within 3 weeks, give us a call.   No other changes today

## 2016-11-04 NOTE — Assessment & Plan Note (Addendum)
S: severe night sweats before he was on testosterone- resolved on testosterone. Had severe fatigue as well. Tearful at times. ED was not responsive to replacement so on cialis. 5gm daily of testosterone by pump Lab Results  Component Value Date   TESTOSTERONE 225 (L) 01/14/2015  A/P: need to update testosterone today. Well Aware of cardiac risks- discussed in particular I was concerned due to dad's history.

## 2016-11-04 NOTE — Progress Notes (Signed)
Pre visit review using our clinic review tool, if applicable. No additional management support is needed unless otherwise documented below in the visit note. 

## 2016-11-04 NOTE — Assessment & Plan Note (Signed)
S: once daily cialis 2.5mg  has been helpful. Prior on viagra but had migraines. cialis prn caused HA but once daily dosing has not caused headache A/P: continue current rx

## 2016-11-04 NOTE — Assessment & Plan Note (Signed)
S: very well controlled on prilosec 20mg - misses day and has recurrence A/P: started with zantac years ago- prefers to stay on prilosec

## 2016-11-04 NOTE — Assessment & Plan Note (Signed)
S:High triglycerides- handout discussed declines. Working on changing eating habits and needs to start exercise. Lipids at CPE.  A/P:Wants to avoid statin. I am concerned about dad with heart disease in 30s
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
## 2016-11-23 ENCOUNTER — Telehealth: Payer: Self-pay

## 2016-11-23 NOTE — Telephone Encounter (Signed)
No information included in note for medication problem. Please clarify  Steven ConchStephen Nivea Holland  Can also send this to Steven MuirJamie - my CMA with the added details

## 2016-12-01 NOTE — Telephone Encounter (Signed)
Cialis not covered needs PA [phone]

## 2016-12-06 ENCOUNTER — Encounter: Payer: Self-pay | Admitting: Neurology

## 2016-12-06 ENCOUNTER — Encounter: Payer: Self-pay | Admitting: Urgent Care

## 2016-12-08 ENCOUNTER — Other Ambulatory Visit: Payer: Self-pay | Admitting: Urgent Care

## 2016-12-08 MED ORDER — TADALAFIL 2.5 MG PO TABS: ORAL_TABLET | ORAL | 1 refills | Status: DC

## 2016-12-08 NOTE — Telephone Encounter (Signed)
Prior has been handled.

## 2017-01-06 ENCOUNTER — Other Ambulatory Visit: Payer: Self-pay

## 2017-01-06 ENCOUNTER — Encounter: Payer: Self-pay | Admitting: Family Medicine

## 2017-01-06 DIAGNOSIS — G43809 Other migraine, not intractable, without status migrainosus: Secondary | ICD-10-CM

## 2017-01-06 DIAGNOSIS — G44211 Episodic tension-type headache, intractable: Secondary | ICD-10-CM

## 2017-01-06 MED ORDER — BUTALBITAL-APAP-CAFF-COD 50-325-40-30 MG PO CAPS: ORAL_CAPSULE | ORAL | 0 refills | Status: DC

## 2017-01-31 ENCOUNTER — Ambulatory Visit: Payer: BLUE CROSS/BLUE SHIELD | Admitting: Neurology

## 2017-02-06 ENCOUNTER — Other Ambulatory Visit: Payer: Self-pay | Admitting: Family Medicine

## 2017-02-06 DIAGNOSIS — G43809 Other migraine, not intractable, without status migrainosus: Secondary | ICD-10-CM

## 2017-02-06 DIAGNOSIS — G44211 Episodic tension-type headache, intractable: Secondary | ICD-10-CM

## 2017-03-13 ENCOUNTER — Other Ambulatory Visit: Payer: Self-pay | Admitting: Family Medicine

## 2017-03-13 DIAGNOSIS — G44211 Episodic tension-type headache, intractable: Secondary | ICD-10-CM

## 2017-03-13 DIAGNOSIS — G43809 Other migraine, not intractable, without status migrainosus: Secondary | ICD-10-CM

## 2017-04-03 ENCOUNTER — Encounter: Payer: Self-pay | Admitting: Family Medicine

## 2017-04-04 ENCOUNTER — Other Ambulatory Visit: Payer: Self-pay

## 2017-04-04 DIAGNOSIS — E291 Testicular hypofunction: Secondary | ICD-10-CM

## 2017-04-04 MED ORDER — TESTOSTERONE 20.25 MG/ACT (1.62%) TD GEL
TRANSDERMAL | 5 refills | Status: DC
Start: 2017-04-04 — End: 2017-12-15

## 2017-04-12 ENCOUNTER — Other Ambulatory Visit: Payer: Self-pay | Admitting: Family Medicine

## 2017-04-12 DIAGNOSIS — G44211 Episodic tension-type headache, intractable: Secondary | ICD-10-CM

## 2017-04-12 DIAGNOSIS — G43809 Other migraine, not intractable, without status migrainosus: Secondary | ICD-10-CM

## 2017-05-01 ENCOUNTER — Other Ambulatory Visit: Payer: Self-pay

## 2017-05-01 DIAGNOSIS — E291 Testicular hypofunction: Secondary | ICD-10-CM

## 2017-05-01 DIAGNOSIS — Z0000 Encounter for general adult medical examination without abnormal findings: Secondary | ICD-10-CM

## 2017-05-01 DIAGNOSIS — Z125 Encounter for screening for malignant neoplasm of prostate: Secondary | ICD-10-CM

## 2017-05-01 DIAGNOSIS — E785 Hyperlipidemia, unspecified: Secondary | ICD-10-CM

## 2017-05-02 ENCOUNTER — Other Ambulatory Visit (INDEPENDENT_AMBULATORY_CARE_PROVIDER_SITE_OTHER): Payer: BLUE CROSS/BLUE SHIELD

## 2017-05-02 DIAGNOSIS — Z125 Encounter for screening for malignant neoplasm of prostate: Secondary | ICD-10-CM

## 2017-05-02 DIAGNOSIS — E291 Testicular hypofunction: Secondary | ICD-10-CM | POA: Diagnosis not present

## 2017-05-02 DIAGNOSIS — E785 Hyperlipidemia, unspecified: Secondary | ICD-10-CM | POA: Diagnosis not present

## 2017-05-02 DIAGNOSIS — E782 Mixed hyperlipidemia: Secondary | ICD-10-CM

## 2017-05-02 DIAGNOSIS — Z0000 Encounter for general adult medical examination without abnormal findings: Secondary | ICD-10-CM | POA: Diagnosis not present

## 2017-05-02 LAB — CBC
HEMATOCRIT: 43.8 % (ref 39.0–52.0)
HEMOGLOBIN: 15 g/dL (ref 13.0–17.0)
MCHC: 34.3 g/dL (ref 30.0–36.0)
MCV: 87.4 fl (ref 78.0–100.0)
PLATELETS: 252 10*3/uL (ref 150.0–400.0)
RBC: 5.01 Mil/uL (ref 4.22–5.81)
RDW: 13.6 % (ref 11.5–15.5)
WBC: 8.9 10*3/uL (ref 4.0–10.5)

## 2017-05-02 LAB — TESTOSTERONE: TESTOSTERONE: 279.59 ng/dL — AB (ref 300.00–890.00)

## 2017-05-02 LAB — COMPREHENSIVE METABOLIC PANEL
ALK PHOS: 66 U/L (ref 39–117)
ALT: 28 U/L (ref 0–53)
AST: 21 U/L (ref 0–37)
Albumin: 4.1 g/dL (ref 3.5–5.2)
BILIRUBIN TOTAL: 0.3 mg/dL (ref 0.2–1.2)
BUN: 16 mg/dL (ref 6–23)
CALCIUM: 9.3 mg/dL (ref 8.4–10.5)
CO2: 30 mEq/L (ref 19–32)
Chloride: 102 mEq/L (ref 96–112)
Creatinine, Ser: 0.85 mg/dL (ref 0.40–1.50)
GFR: 102.31 mL/min (ref >60.00)
Glucose, Bld: 155 mg/dL — ABNORMAL HIGH (ref 70–99)
Potassium: 3.6 mEq/L (ref 3.5–5.1)
Sodium: 140 mEq/L (ref 135–145)
TOTAL PROTEIN: 6.9 g/dL (ref 6.0–8.3)

## 2017-05-02 LAB — PSA: PSA: 1.31 ng/mL (ref 0.10–4.00)

## 2017-05-02 LAB — LIPID PANEL
CHOLESTEROL: 208 mg/dL — AB (ref 0–200)
HDL: 36.1 mg/dL — AB (ref >39.00)
Total CHOL/HDL Ratio: 6

## 2017-05-02 LAB — LDL CHOLESTEROL, DIRECT: Direct LDL: 115 mg/dL

## 2017-05-09 ENCOUNTER — Ambulatory Visit (INDEPENDENT_AMBULATORY_CARE_PROVIDER_SITE_OTHER): Payer: BLUE CROSS/BLUE SHIELD | Admitting: Family Medicine

## 2017-05-09 ENCOUNTER — Encounter: Payer: Self-pay | Admitting: Family Medicine

## 2017-05-09 VITALS — BP 136/88 | HR 99 | Temp 98.2°F | Ht 68.5 in | Wt 223.4 lb

## 2017-05-09 DIAGNOSIS — G44211 Episodic tension-type headache, intractable: Secondary | ICD-10-CM

## 2017-05-09 DIAGNOSIS — R739 Hyperglycemia, unspecified: Secondary | ICD-10-CM | POA: Diagnosis not present

## 2017-05-09 DIAGNOSIS — G43809 Other migraine, not intractable, without status migrainosus: Secondary | ICD-10-CM | POA: Diagnosis not present

## 2017-05-09 DIAGNOSIS — N528 Other male erectile dysfunction: Secondary | ICD-10-CM | POA: Diagnosis not present

## 2017-05-09 DIAGNOSIS — E291 Testicular hypofunction: Secondary | ICD-10-CM

## 2017-05-09 DIAGNOSIS — K589 Irritable bowel syndrome without diarrhea: Secondary | ICD-10-CM

## 2017-05-09 DIAGNOSIS — Z0000 Encounter for general adult medical examination without abnormal findings: Secondary | ICD-10-CM

## 2017-05-09 DIAGNOSIS — I10 Essential (primary) hypertension: Secondary | ICD-10-CM

## 2017-05-09 DIAGNOSIS — E785 Hyperlipidemia, unspecified: Secondary | ICD-10-CM

## 2017-05-09 DIAGNOSIS — G43109 Migraine with aura, not intractable, without status migrainosus: Secondary | ICD-10-CM | POA: Diagnosis not present

## 2017-05-09 MED ORDER — BUTALBITAL-APAP-CAFF-COD 50-325-40-30 MG PO CAPS: ORAL_CAPSULE | ORAL | 5 refills | Status: DC

## 2017-05-09 NOTE — Assessment & Plan Note (Signed)
S: CBG at 155. Patient was not fasting as had boost 6 hours prior to labs A/P: recheck next labs- consider a1c as well

## 2017-05-09 NOTE — Assessment & Plan Note (Signed)
S: very poorly controlled on no meds- he had agreed to work on lifestyle choices- has not made great progress.  Lab Results  Component Value Date   CHOL 208 (H) 05/02/2017   HDL 36.10 (L) 05/02/2017   LDLCALC 84 01/14/2015   LDLDIRECT 115.0 05/02/2017   TRIG (H) 05/02/2017    421.0 Triglyceride is over 400; calculations on Lipids are invalid.   CHOLHDL 6 05/02/2017   A/P:  once again discussed aggressive lifestyle changes. Goal under 200 within a year. He was not fasting of note (had boost 6 hrs before labs)- will recheck 1 year

## 2017-05-09 NOTE — Assessment & Plan Note (Signed)
Hypogonadism- testosterone in high end of low range. Aware of cardiac risks of replacement. On max dose of gel 1.62% at 81g daily. Symptoms are controlled despite slightly low Lab Results  Component Value Date   TESTOSTERONE 279.59 (L) 05/02/2017

## 2017-05-09 NOTE — Assessment & Plan Note (Signed)
HTN- controlled on hctz 25mg  on recheck BP Readings from Last 3 Encounters:  05/09/17 136/88  11/04/16 124/84  07/05/16 130/84

## 2017-05-09 NOTE — Assessment & Plan Note (Addendum)
Migraine- has upcoming appointment with Dr. Everlena CooperJaffe. We gave him in march a short term supply of fiorcet with codeine #10 per month.  Had advised him not to drive for 6-8 hours after fiorcet with codeine. Rough 2 months in July and august. . Has failed triptans in past  ziac in past- states had erectile issues on beta blocker. Wants to hold off on trial until discusses with Dr. Everlena CooperJaffe  Did refill Fioricet with codeine- if this is part of an acceptable regimen per neurology I am willing to refill this while patient works with Dr. Everlena CooperJaffe on prophylaxis. Also willing to stop if Dr. Everlena CooperJaffe recommends against. NCCSRS reviewed and low risk.

## 2017-05-09 NOTE — Assessment & Plan Note (Signed)
IBS- nausea and abdominal cramping as main symptom. Has had severe nausea in the past leading to syncope- if avoids this with zofran no syncope. Triggers include milk- which he loves. Has been on donatal for cramping in past. We have discussed hyoscyamine as an option if needed. States symptoms reasonably controlled

## 2017-05-09 NOTE — Assessment & Plan Note (Signed)
ED- once daily cialis 2.5mg  helpful- prior on cialis prn and caused HA as did prn viagra. Continue current rx

## 2017-05-09 NOTE — Progress Notes (Signed)
Phone: [phone]  Subjective:  Patient presents today for their annual physical. Chief complaint-noted.   See problem oriented charting- ROS- full  review of systems was completed and negative except for: headaches have been more regular than usual  The following were reviewed and entered/updated in epic: Past Medical History:  Diagnosis Date  . Chicken pox   . Hypertension   . Low testosterone   . Syncope    related to IBS and severe nausea   Patient Active Problem List   Diagnosis Date Noted  . Hyperlipidemia 11/04/2016    Priority: Medium  . HTN (hypertension) 12/18/2011    Priority: Medium  . Migraine headache with aura 12/18/2011    Priority: Medium  . GERD (gastroesophageal reflux disease) 12/18/2011    Priority: Medium  . Irritable bowel syndrome (IBS) 12/18/2011    Priority: Medium  . Obesity, Class I, BMI 30-34.9 10/22/2015    Priority: Low  . Hypogonadism male 12/18/2011    Priority: Low  . ED (erectile dysfunction) 12/18/2011    Priority: Low  . AR (allergic rhinitis) 12/18/2011    Priority: Low   Past Surgical History:  Procedure Laterality Date  . none      Family History  Problem Relation Age of Onset  . Arthritis Mother        knee replacements and back  . Heart disease Father        30s reportedly- patient no issues  . Benign prostatic hyperplasia Father   . Other Brother        hypogonadism/low T both brothers  . Migraines Brother        bothbrothers  . Prostate cancer Maternal Grandfather   . Diabetes Paternal Grandmother     Medications- reviewed and updated Current Outpatient Prescriptions  Medication Sig Dispense Refill  . butalbital-acetaminophen-caffeine (FIORICET WITH CODEINE) 50-325-40-30 MG capsule TAKE ONE CAPSULE BY MOUTH EVERY 4 HOURS AS NEEDED 10 capsule 0  . hydrochlorothiazide (MICROZIDE) 12.5 MG capsule Take 1 capsule (12.5 mg total) by mouth daily. 90 capsule 3  . Multiple Vitamin (MULITIVITAMIN WITH MINERALS) TABS  Take 1 tablet by mouth daily.    Marland Kitchen. omeprazole (PRILOSEC) 20 MG capsule Take 1 capsule (20 mg total) by mouth daily. 90 capsule 3  . ondansetron (ZOFRAN) 8 MG tablet TAKE 1 TABLET (8 MG TOTAL) BY MOUTH EVERY 8 (EIGHT) HOURS AS NEEDED. 9 tablet 11  . Tadalafil (CIALIS) 2.5 MG TABS TAKE 1 TABLET (2.5 MG TOTAL) BY MOUTH DAILY. 90 tablet 1  . Testosterone 20.25 MG/ACT (1.62%) GEL APPLY 4 PUMPS TOPICALLY DAILY. 450 g 5   Allergies-reviewed and updated Allergies  Allergen Reactions  . Sulfa Antibiotics Swelling    Social History   Social History  . Marital status: Single    Spouse name: N/A  . Number of children: N/A  . Years of education: N/A   Social History Main Topics  . Smoking status: Never Smoker  . Smokeless tobacco: Never Used  . Alcohol use Yes     Comment: rare  . Drug use: No  . Sexual activity: Yes   Other Topics Concern  . Not on file   Social History Narrative   Married wife Stanton KidneyDebra. No children.       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 Very busy at work. Enjoys Psychologist, educationalart. Household projects and Holiday representativeconstruction.     Objective: BP 136/88   Pulse 99   Temp 98.2 F (36.8  C) (Oral)   Ht 5' 8.5" (1.74 m)   Wt 223 lb 6.4 oz (101.3 kg)   SpO2 95%   BMI 33.47 kg/m  Gen: NAD, resting comfortably HEENT: Mucous membranes are moist. Oropharynx normal. Congenital deviation of left eye Neck: no thyromegaly CV: RRR no murmurs rubs or gallops Lungs: CTAB no crackles, wheeze, rhonchi Abdomen: soft/nontender/nondistended/normal bowel sounds. No rebound or guarding.  Ext: no edema Skin: warm, dry Neuro: grossly normal, moves all extremities, PERRLA Rectal: normal tone, normal sized prostate (large side of normal), no masses or tenderness   Assessment/Plan:  46 y.o. male presenting for annual physical.  Health Maintenance counseling: 1. Anticipatory guidance: Patient counseled regarding regular dental exams -q6 months advised-  missing some, eye exams -about yearly, wearing seatbelts.  2. Risk factor reduction:  Advised patient of need for regular exercise and diet rich and fruits and vegetables to reduce risk of heart attack and stroke. Exercise- has bene lacking- advised 150 minutes a week. Diet-has been eating/drinking worse due to the headache increase- sees Dr .Everlena Cooper soon so hopefully that will help. Weight trending up- advised to reverse this trend. He wants to get below 200 in a year.  Wt Readings from Last 3 Encounters:  05/09/17 223 lb 6.4 oz (101.3 kg)  11/04/16 220 lb 6.4 oz (100 kg)  07/05/16 221 lb (100.2 kg)     3. Immunizations/screenings/ancillary studies- advised flu shot this fall - declines. .  Immunization History  Administered Date(s) Administered  . Tdap 08/19/2008, 01/14/2015    4. Prostate cancer screening- slight increase in PSA over last 2 years- continue to trend yearly. Low risk prostate exam . Have to watch with being on testosterone and family history in [facility]  Component Value Date   PSA 1.31 05/02/2017   PSA 0.74 01/14/2015   PSA 0.73 05/09/2014   5. Colon cancer screening - no family history, start at age 46 6. Skin cancer screening/prevention- advised regular sunscreen use. Denies worrisome, changing, or new skin lesions.  7. Testicular cancer screening- advised monthly self exams  8. STD screening- patient opts out.   Status of chronic or acute concerns   GERD- on daily PPI, has declined zantac retrial  Hypogonadism male Hypogonadism- testosterone in high end of low range. Aware of cardiac risks of replacement. On max dose of gel 1.62% at 81g daily. Symptoms are controlled despite slightly low Lab Results  Component Value Date   TESTOSTERONE 279.59 (L) 05/02/2017    ED (erectile dysfunction) ED- once daily cialis 2.5mg  helpful- prior on cialis prn and caused HA as did prn viagra. Continue current rx   Irritable bowel syndrome (IBS) IBS- nausea and abdominal cramping as  main symptom. Has had severe nausea in the past leading to syncope- if avoids this with zofran no syncope. Triggers include milk- which he loves. Has been on donatal for cramping in past. We have discussed hyoscyamine as an option if needed. States symptoms reasonably controlled  HTN (hypertension) HTN- controlled on hctz 25mg  on recheck BP Readings from Last 3 Encounters:  05/09/17 136/88  11/04/16 124/84  07/05/16 130/84     Migraine headache with aura Migraine- has upcoming appointment with Dr. Everlena Cooper. We gave him in march a short term supply of fiorcet with codeine #10 per month.  Had advised him not to drive for 6-8 hours after fiorcet with codeine. Rough 2 months in July and august. . Has failed triptans in past  ziac in past- states had erectile issues on beta  blocker. Wants to hold off on trial until discusses with Dr. Everlena Cooper  Did refill Fioricet with codeine- if this is part of an acceptable regimen per neurology I am willing to refill this while patient works with Dr. Everlena Cooper on prophylaxis. Also willing to stop if Dr. Everlena Cooper recommends against. NCCSRS reviewed and low risk.   Hyperlipidemia S: very poorly controlled on no meds- he had agreed to work on lifestyle choices- has not made great progress.  Lab Results  Component Value Date   CHOL 208 (H) 05/02/2017   HDL 36.10 (L) 05/02/2017   LDLCALC 84 01/14/2015   LDLDIRECT 115.0 05/02/2017   TRIG (H) 05/02/2017    421.0 Triglyceride is over 400; calculations on Lipids are invalid.   CHOLHDL 6 05/02/2017   A/P:  once again discussed aggressive lifestyle changes. Goal under 200 within a year. He was not fasting of note (had boost 6 hrs before labs)- will recheck 1 year  Hyperglycemia S: CBG at 155. Patient was not fasting as had boost 6 hours prior to labs A/P: recheck next labs- consider a1c as well  Advised 6 month follow up for BP and weight check.   Future Appointments Date Time Provider Department Center  05/09/2017 8:45  AM Shelva Majestic, MD [facility]  06/08/2017 1:00 PM Drema Dallas, DO [facility] None   Return precautions advised.  Tana Conch, MD

## 2017-05-09 NOTE — Patient Instructions (Signed)
Wt Readings from Last 3 Encounters:  05/09/17 223 lb 6.4 oz (101.3 kg)  11/04/16 220 lb 6.4 oz (100 kg)  07/05/16 221 lb (100.2 kg)  Check in 6 months from now- goal to work on getting weight to 200 or under within 6-12 months.   150 minutes of exercise per week recommended  Blood sugar nad cholesterol up- may have been related to boost (may actually want to quit this in regards to weight- perhaps have an apple or carrots before bed.    DASH Eating Plan DASH stands for "Dietary Approaches to Stop Hypertension." The DASH eating plan is a healthy eating plan that has been shown to reduce high blood pressure (hypertension). It may also reduce your risk for type 2 diabetes, heart disease, and stroke. The DASH eating plan may also help with weight loss. What are tips for following this plan? General guidelines  Avoid eating more than 2,300 mg (milligrams) of salt (sodium) a day. If you have hypertension, you may need to reduce your sodium intake to 1,500 mg a day.  Limit alcohol intake to no more than 1 drink a day for nonpregnant women and 2 drinks a day for men. One drink equals 12 oz of beer, 5 oz of wine, or 1 oz of hard liquor.  Work with your health care provider to maintain a healthy body weight or to lose weight. Ask what an ideal weight is for you.  Get at least 30 minutes of exercise that causes your heart to beat faster (aerobic exercise) most days of the week. Activities may include walking, swimming, or biking.  Work with your health care provider or diet and nutrition specialist (dietitian) to adjust your eating plan to your individual calorie needs. Reading food labels  Check food labels for the amount of sodium per serving. Choose foods with less than 5 percent of the Daily Value of sodium. Generally, foods with less than 300 mg of sodium per serving fit into this eating plan.  To find whole grains, look for the word "whole" as the first word in the ingredient  list. Shopping  Buy products labeled as "low-sodium" or "no salt added."  Buy fresh foods. Avoid canned foods and premade or frozen meals. Cooking  Avoid adding salt when cooking. Use salt-free seasonings or herbs instead of table salt or sea salt. Check with your health care provider or pharmacist before using salt substitutes.  Do not fry foods. Cook foods using healthy methods such as baking, boiling, grilling, and broiling instead.  Cook with heart-healthy oils, such as olive, canola, soybean, or sunflower oil. Meal planning   Eat a balanced diet that includes: ? 5 or more servings of fruits and vegetables each day. At each meal, try to fill half of your plate with fruits and vegetables. ? Up to 6-8 servings of whole grains each day. ? Less than 6 oz of lean meat, poultry, or fish each day. A 3-oz serving of meat is about the same size as a deck of cards. One egg equals 1 oz. ? 2 servings of low-fat dairy each day. ? A serving of nuts, seeds, or beans 5 times each week. ? Heart-healthy fats. Healthy fats called Omega-3 fatty acids are found in foods such as flaxseeds and coldwater fish, like sardines, salmon, and mackerel.  Limit how much you eat of the following: ? Canned or prepackaged foods. ? Food that is high in trans fat, such as fried foods. ? Food that is high in  saturated fat, such as fatty meat. ? Sweets, desserts, sugary drinks, and other foods with added sugar. ? Full-fat dairy products.  Do not salt foods before eating.  Try to eat at least 2 vegetarian meals each week.  Eat more home-cooked food and less restaurant, buffet, and fast food.  When eating at a restaurant, ask that your food be prepared with less salt or no salt, if possible. What foods are recommended? The items listed may not be a complete list. Talk with your dietitian about what dietary choices are best for you. Grains Whole-grain or whole-wheat bread. Whole-grain or whole-wheat pasta. Brown  rice. [place] Morrow. Bulgur. Whole-grain and low-sodium cereals. Pita bread. Low-fat, low-sodium crackers. Whole-wheat flour tortillas. Vegetables Fresh or frozen vegetables (raw, steamed, roasted, or grilled). Low-sodium or reduced-sodium tomato and vegetable juice. Low-sodium or reduced-sodium tomato sauce and tomato paste. Low-sodium or reduced-sodium canned vegetables. Fruits All fresh, dried, or frozen fruit. Canned fruit in natural juice (without added sugar). Meat and other protein foods Skinless chicken or [place]. Ground chicken or [place]. Pork with fat trimmed off. Fish and seafood. Egg whites. Dried beans, peas, or lentils. Unsalted nuts, nut butters, and seeds. Unsalted canned beans. Lean cuts of beef with fat trimmed off. Low-sodium, lean deli meat. Dairy Low-fat (1%) or fat-free (skim) milk. Fat-free, low-fat, or reduced-fat cheeses. Nonfat, low-sodium ricotta or cottage cheese. Low-fat or nonfat yogurt. Low-fat, low-sodium cheese. Fats and oils Soft margarine without trans fats. Vegetable oil. Low-fat, reduced-fat, or light mayonnaise and salad dressings (reduced-sodium). Canola, safflower, olive, soybean, and sunflower oils. Avocado. Seasoning and other foods Herbs. Spices. Seasoning mixes without salt. Unsalted popcorn and pretzels. Fat-free sweets. What foods are not recommended? The items listed may not be a complete list. Talk with your dietitian about what dietary choices are best for you. Grains Baked goods made with fat, such as croissants, muffins, or some breads. Dry pasta or rice meal packs. Vegetables Creamed or fried vegetables. Vegetables in a cheese sauce. Regular canned vegetables (not low-sodium or reduced-sodium). Regular canned tomato sauce and paste (not low-sodium or reduced-sodium). Regular tomato and vegetable juice (not low-sodium or reduced-sodium). Angie Fava. Olives. Fruits Canned fruit in a light or heavy syrup. Fried fruit. Fruit in cream or butter  sauce. Meat and other protein foods Fatty cuts of meat. Ribs. Fried meat. Berniece Salines. Sausage. Bologna and other processed lunch meats. Salami. Fatback. Hotdogs. Bratwurst. Salted nuts and seeds. Canned beans with added salt. Canned or smoked fish. Whole eggs or egg yolks. Chicken or [place] with skin. Dairy Whole or 2% milk, cream, and half-and-half. Whole or full-fat cream cheese. Whole-fat or sweetened yogurt. Full-fat cheese. Nondairy creamers. Whipped toppings. Processed cheese and cheese spreads. Fats and oils Butter. Stick margarine. Lard. Shortening. Ghee. Bacon fat. Tropical oils, such as coconut, palm kernel, or palm oil. Seasoning and other foods Salted popcorn and pretzels. Onion salt, garlic salt, seasoned salt, table salt, and sea salt. Worcestershire sauce. Tartar sauce. Barbecue sauce. Teriyaki sauce. Soy sauce, including reduced-sodium. Steak sauce. Canned and packaged gravies. Fish sauce. Oyster sauce. Cocktail sauce. Horseradish that you find on the shelf. Ketchup. Mustard. Meat flavorings and tenderizers. Bouillon cubes. Hot sauce and Tabasco sauce. Premade or packaged marinades. Premade or packaged taco seasonings. Relishes. Regular salad dressings. Where to find more information:  National Heart, Lung, and [place]: https://wilson-eaton.com/  American Heart Association: www.heart.org Summary  The DASH eating plan is a healthy eating plan that has been shown to reduce high blood pressure (hypertension). It may also  reduce your risk for type 2 diabetes, heart disease, and stroke.  With the DASH eating plan, you should limit salt (sodium) intake to 2,300 mg a day. If you have hypertension, you may need to reduce your sodium intake to 1,500 mg a day.  When on the DASH eating plan, aim to eat more fresh fruits and vegetables, whole grains, lean proteins, low-fat dairy, and heart-healthy fats.  Work with your health care provider or diet and nutrition specialist (dietitian) to adjust  your eating plan to your individual calorie needs. This information is not intended to replace advice given to you by your health care provider. Make sure you discuss any questions you have with your health care provider. Document Released: 08/11/2011 Document Revised: 08/15/2016 Document Reviewed: 08/15/2016 Elsevier Interactive Patient Education  2017 [employer].

## 2017-06-02 ENCOUNTER — Other Ambulatory Visit: Payer: Self-pay

## 2017-06-02 ENCOUNTER — Encounter: Payer: Self-pay | Admitting: Family Medicine

## 2017-06-02 MED ORDER — TADALAFIL 2.5 MG PO TABS: ORAL_TABLET | ORAL | 1 refills | Status: DC

## 2017-06-08 ENCOUNTER — Ambulatory Visit (INDEPENDENT_AMBULATORY_CARE_PROVIDER_SITE_OTHER): Payer: BLUE CROSS/BLUE SHIELD | Admitting: Neurology

## 2017-06-08 ENCOUNTER — Encounter: Payer: Self-pay | Admitting: Neurology

## 2017-06-08 VITALS — BP 132/90 | HR 110 | Ht 68.0 in | Wt 221.0 lb

## 2017-06-08 DIAGNOSIS — G43709 Chronic migraine without aura, not intractable, without status migrainosus: Secondary | ICD-10-CM

## 2017-06-08 MED ORDER — ERENUMAB-AOOE 70 MG/ML ~~LOC~~ SOAJ: 70.0000 mg | SUBCUTANEOUS | 11 refills | Status: DC

## 2017-06-08 MED ORDER — NAPROXEN 500 MG PO TABS: 500.0000 mg | ORAL_TABLET | Freq: Two times a day (BID) | ORAL | 2 refills | Status: DC | PRN

## 2017-06-08 MED ORDER — ELETRIPTAN HYDROBROMIDE 40 MG PO TABS: ORAL_TABLET | ORAL | 2 refills | Status: DC

## 2017-06-08 NOTE — Patient Instructions (Signed)
Migraine Recommendations: 1.  We will set you up for Aimovig. 2.  Take Relpax (eletriptan)  at earliest onset of headache.  May repeat dose once in 2 hours if needed.  Do not exceed two tablets in 24 hours.  If you wake up with a migraine, take Relpax with naproxen  3.  Stop Fioricet and Excedrin.  Limit use of pain relievers to no more than 2 days out of the week.  These medications include acetaminophen, ibuprofen, triptans and narcotics.  This will help reduce risk of rebound headaches. 4.  Be aware of common food triggers such as processed sweets, processed foods with nitrites (such as deli meat, hot dogs, sausages), foods with MSG, alcohol (such as wine), chocolate, certain cheeses, certain fruits (dried fruits, some citrus fruit), vinegar, diet soda. 4.  Avoid caffeine 5.  Routine exercise 6.  Proper sleep hygiene 7.  Stay adequately hydrated with water 8.  Keep a headache diary. 9.  Maintain proper stress management. 10.  Do not skip meals. 11.  Consider supplements:  Magnesium citrate  to  daily, riboflavin , Coenzyme Q 10  three times daily 12.  Follow up in 3 months.

## 2017-06-08 NOTE — Progress Notes (Signed)
NEUROLOGY CONSULTATION NOTE  Steven Holland MRN: [id number] DOB: 1970-12-24  Referring provider: Dr. Durene Cal Primary care provider: Dr. Durene Cal  Reason for consult:  migraine  HISTORY OF PRESENT ILLNESS: Steven Holland is a 46 year old man who presents for migraines.  History supplemented by PCP note.  Onset:  "all my life" Location:  Back of head/neck or bi-temporal.  Quality:  pounding Intensity:  Varies from 2-3/10 to 5-6/10, rarely 7-8/10 Aura:  no Prodrome:  no Postdrome:  no Associated symptoms:  Nausea, photophobia, phonophobia.  He denies thunderclap headache. Duration:  All day or days if not pain reliever. Otherwise, 2 to 4 hours.  Sometimes wakes up with headache Frequency:  4-5 days a week. Frequency of abortive medication: 4-5 days a week Triggers/exacerbating factors:  Change in barometric pressure, allergies Relieving factors:  no Activity:  aggravates  Past NSAIDS:  ibuprofen Past analgesics:  Tylenol, vicodin (worked) Past abortive triptans:  Sumatriptan tablet, Maxalt Past muscle relaxants:  no Past anti-emetic:  no Past antihypertensive medications:  bisoprolol Past antidepressant medications:  no Past anticonvulsant medications:  topiramate  (paresthesias, impotence) Past vitamins/Herbal/Supplements:  no Other past therapies:  no  Current NSAIDS:  no Current analgesics:  Fioricet with Codeine, Excedrin Migraine Current triptans:  no Current anti-emetic:  Zofran  Current muscle relaxants:  no Current anti-anxiolytic:  no Current sleep aide:  no Current Antihypertensive medications:  HCTZ Current Antidepressant medications:  no Current Anticonvulsant medications:  no Current Vitamins/Herbal/Supplements:  no Current Antihistamines/Decongestants:  no Other therapy:  Chiropractic medicine (effective)  Caffeine:  2 Cokes a day Alcohol:  rarely Smoker:  no Diet:  Drinks two 16 oz bottles of water daily Exercise:   no Depression/anxiety:  no Sleep hygiene:  5 hours a night Family history of headache:  Mother's side  CMP from 07/05/16:  Na 140, K 4.2, Cl 102, CO2 27, glucose 93, BUN 11, Cr 1, total bili 0.3, ALP 73, AST 23 and ALT 28.  PAST MEDICAL HISTORY: Past Medical History:  Diagnosis Date  . Chicken pox   . Hypertension   . Low testosterone   . Syncope    related to IBS and severe nausea    PAST SURGICAL HISTORY: Past Surgical History:  Procedure Laterality Date  . none      MEDICATIONS: Current Outpatient Prescriptions on File Prior to Visit  Medication Sig Dispense Refill  . butalbital-acetaminophen-caffeine (FIORICET WITH CODEINE) 50-325-40-30 MG capsule TAKE ONE CAPSULE BY MOUTH EVERY 4 HOURS AS NEEDED 10 capsule 5  . hydrochlorothiazide (MICROZIDE) 12.5 MG capsule Take 1 capsule (12.5 mg total) by mouth daily. 90 capsule 3  . Multiple Vitamin (MULITIVITAMIN WITH MINERALS) TABS Take 1 tablet by mouth daily.    Marland Kitchen omeprazole (PRILOSEC) 20 MG capsule Take 1 capsule (20 mg total) by mouth daily. 90 capsule 3  . ondansetron (ZOFRAN) 8 MG tablet TAKE 1 TABLET (8 MG TOTAL) BY MOUTH EVERY 8 (EIGHT) HOURS AS NEEDED. 9 tablet 11  . Tadalafil (CIALIS) 2.5 MG TABS TAKE 1 TABLET (2.5 MG TOTAL) BY MOUTH DAILY. 90 tablet 1  . Testosterone 20.25 MG/ACT (1.62%) GEL APPLY 4 PUMPS TOPICALLY DAILY. 450 g 5   No current facility-administered medications on file prior to visit.     ALLERGIES: Allergies  Allergen Reactions  . Sulfa Antibiotics Swelling    FAMILY HISTORY: Family History  Problem Relation Age of Onset  . Arthritis Mother        knee replacements and back  .  Heart disease Father        30s reportedly- patient no issues  . Benign prostatic hyperplasia Father   . Other Brother        hypogonadism/low T both brothers  . Migraines Brother        bothbrothers  . Prostate cancer Maternal Grandfather   . Diabetes Paternal Grandmother     SOCIAL HISTORY: Social History    Social History  . Marital status: Single    Spouse name: N/A  . Number of children: N/A  . Years of education: N/A   Occupational History  . Not on file.   Social History Main Topics  . Smoking status: Never Smoker  . Smokeless tobacco: Never Used  . Alcohol use Yes     Comment: rare  . Drug use: No  . Sexual activity: Yes   Other Topics Concern  . Not on file   Social History Narrative   Married wife [name]. No children.       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 Very busy at work. Enjoys Psychologist, educational. Household projects and Holiday representative.     REVIEW OF SYSTEMS: Constitutional: No fevers, chills, or sweats, no generalized fatigue, change in appetite Eyes: No visual changes, double vision, eye pain Ear, nose and throat: No hearing loss, ear pain, nasal congestion, sore throat Cardiovascular: No chest pain, palpitations Respiratory:  No shortness of breath at rest or with exertion, wheezes GastrointestinaI: No nausea, vomiting, diarrhea, abdominal pain, fecal incontinence Genitourinary:  No dysuria, urinary retention or frequency Musculoskeletal:  No neck pain, back pain Integumentary: No rash, pruritus, skin lesions Neurological: as above Psychiatric: No depression, insomnia, anxiety Endocrine: No palpitations, fatigue, diaphoresis, mood swings, change in appetite, change in weight, increased thirst Hematologic/Lymphatic:  No purpura, petechiae. Allergic/Immunologic: no itchy/runny eyes, nasal congestion, recent allergic reactions, rashes  PHYSICAL EXAM: Vitals:   06/08/17 1302  BP: 132/90  Pulse: (!) 110  SpO2: 97%   General: No acute distress.  Patient appears well-groomed.   Head:  Normocephalic/atraumatic Eyes:  fundi examined but not visualized Neck: supple, no paraspinal tenderness, full range of motion Back: No paraspinal tenderness Heart: regular rate and rhythm Lungs: Clear to auscultation bilaterally. Vascular: No  carotid bruits. Neurological Exam: Mental status: alert and oriented to person, place, and time, recent and remote memory intact, fund of knowledge intact, attention and concentration intact, speech fluent and not dysarthric, language intact. Cranial nerves: CN I: not tested CN II: pupils equal, round and reactive to light, visual fields intact CN III, IV, VI:  full range of motion, no nystagmus, no ptosis CN V: facial sensation intact CN VII: upper and lower face symmetric CN VIII: hearing intact CN IX, X: gag intact, uvula midline CN XI: sternocleidomastoid and trapezius muscles intact CN XII: tongue midline Bulk & Tone: normal, no fasciculations. Motor:  5/5 throughout  Sensation: temperature and vibration sensation intact. Deep Tendon Reflexes:  2+ throughout, toes downgoing.  Finger to nose testing:  Without dysmetria.  Heel to shin:  Without dysmetria.  Gait:  Normal station and stride.  Able to turn and tandem walk. Romberg negative.  IMPRESSION: Chronic migraine  PLAN: 1.  We will set you up for Aimovig. 2.  Take Relpax (eletriptan)  for abortive therapy.  If he should wake up with a migraine, take Relpax with naproxen  3.  Stop Fioricet and Excedrin.  Limit use of pain relievers to no more than 2 days  out of the week to help reduce risk of rebound headaches. 4.  Be aware of common food triggers such as processed sweets, processed foods with nitrites (such as deli meat, hot dogs, sausages), foods with MSG, alcohol (such as wine), chocolate, certain cheeses, certain fruits (dried fruits, some citrus fruit), vinegar, diet soda. 4.  Avoid caffeine 5.  Routine exercise 6.  Proper sleep hygiene 7.  Stay adequately hydrated with water 8.  Keep a headache diary. 9.  Maintain proper stress management. 10.  Do not skip meals. 11.  Consider supplements:  Magnesium citrate  to  daily, riboflavin , Coenzyme Q 10  three times daily 12.  Follow up in 3  months.  Thank you for allowing me to take part in the care of this patient.  Shon Millet, DO  CC: Tana Conch, MD

## 2017-06-22 ENCOUNTER — Telehealth: Payer: Self-pay

## 2017-06-22 NOTE — Telephone Encounter (Signed)
Called [facility] spoke  To Steven Holland, advsd her of approval for Aimovig, gave her approval# [id number] valid 05/23/17-06/22/2018

## 2017-06-22 NOTE — Progress Notes (Signed)
Submitted PA rqst on line thru covermymeds for aimovig, rcvd approval, called Pt to advise, his VM is full

## 2017-08-09 ENCOUNTER — Encounter: Payer: Self-pay | Admitting: Family Medicine

## 2017-08-10 ENCOUNTER — Other Ambulatory Visit: Payer: Self-pay

## 2017-08-10 DIAGNOSIS — G44211 Episodic tension-type headache, intractable: Secondary | ICD-10-CM

## 2017-08-10 MED ORDER — ONDANSETRON HCL 8 MG PO TABS: ORAL_TABLET | ORAL | 11 refills | Status: DC

## 2017-08-23 ENCOUNTER — Other Ambulatory Visit: Payer: Self-pay | Admitting: Urgent Care

## 2017-08-23 DIAGNOSIS — K219 Gastro-esophageal reflux disease without esophagitis: Secondary | ICD-10-CM

## 2017-09-21 ENCOUNTER — Ambulatory Visit: Payer: BLUE CROSS/BLUE SHIELD | Admitting: Neurology

## 2017-09-24 ENCOUNTER — Other Ambulatory Visit: Payer: Self-pay | Admitting: Urgent Care

## 2017-09-24 DIAGNOSIS — I10 Essential (primary) hypertension: Secondary | ICD-10-CM

## 2017-10-12 ENCOUNTER — Encounter: Payer: Self-pay | Admitting: Family Medicine

## 2017-10-12 ENCOUNTER — Other Ambulatory Visit: Payer: Self-pay

## 2017-10-12 DIAGNOSIS — I10 Essential (primary) hypertension: Secondary | ICD-10-CM

## 2017-10-12 MED ORDER — HYDROCHLOROTHIAZIDE 12.5 MG PO CAPS: 12.5000 mg | ORAL_CAPSULE | Freq: Every day | ORAL | 3 refills | Status: DC

## 2017-10-13 ENCOUNTER — Other Ambulatory Visit: Payer: Self-pay

## 2017-10-13 DIAGNOSIS — I10 Essential (primary) hypertension: Secondary | ICD-10-CM

## 2017-10-13 MED ORDER — HYDROCHLOROTHIAZIDE 12.5 MG PO CAPS: 12.5000 mg | ORAL_CAPSULE | Freq: Every day | ORAL | 3 refills | Status: DC

## 2017-10-29 ENCOUNTER — Other Ambulatory Visit: Payer: Self-pay | Admitting: Neurology

## 2017-11-07 ENCOUNTER — Ambulatory Visit: Payer: BLUE CROSS/BLUE SHIELD | Admitting: Family Medicine

## 2017-11-15 ENCOUNTER — Other Ambulatory Visit: Payer: Self-pay

## 2017-11-15 DIAGNOSIS — G43809 Other migraine, not intractable, without status migrainosus: Secondary | ICD-10-CM

## 2017-11-15 DIAGNOSIS — G44211 Episodic tension-type headache, intractable: Secondary | ICD-10-CM

## 2017-11-15 MED ORDER — BUTALBITAL-APAP-CAFF-COD 50-325-40-30 MG PO CAPS: ORAL_CAPSULE | ORAL | 5 refills | Status: DC

## 2017-12-15 ENCOUNTER — Ambulatory Visit: Payer: BLUE CROSS/BLUE SHIELD | Admitting: Family Medicine

## 2017-12-15 ENCOUNTER — Encounter: Payer: Self-pay | Admitting: Family Medicine

## 2017-12-15 VITALS — BP 112/88 | HR 99 | Temp 98.0°F | Ht 68.0 in | Wt 218.4 lb

## 2017-12-15 DIAGNOSIS — E291 Testicular hypofunction: Secondary | ICD-10-CM

## 2017-12-15 DIAGNOSIS — E785 Hyperlipidemia, unspecified: Secondary | ICD-10-CM | POA: Diagnosis not present

## 2017-12-15 DIAGNOSIS — I10 Essential (primary) hypertension: Secondary | ICD-10-CM | POA: Diagnosis not present

## 2017-12-15 DIAGNOSIS — H9313 Tinnitus, bilateral: Secondary | ICD-10-CM

## 2017-12-15 MED ORDER — TESTOSTERONE 20.25 MG/ACT (1.62%) TD GEL: TRANSDERMAL | 5 refills | Status: DC

## 2017-12-15 MED ORDER — TADALAFIL 2.5 MG PO TABS: ORAL_TABLET | ORAL | 1 refills | Status: DC

## 2017-12-15 NOTE — Assessment & Plan Note (Signed)
S: controlled on hctz 25mg  BP Readings from Last 3 Encounters:  12/15/17 112/88  06/08/17 132/90  05/09/17 136/88  A/P: We discussed blood pressure goal of <140/90. Continue current meds

## 2017-12-15 NOTE — Patient Instructions (Signed)
We will call you within a week or two about your referral to ENT. If you do not hear within 3 weeks, give us a call.   See me mid September or just the 5th or later for a physical.   No changes today

## 2017-12-15 NOTE — Assessment & Plan Note (Signed)
S: Testosterone has been on high side of low even with max dose androgel. Has low symptoms despite low #s and doesn't want to try injections A/P: refill meds- will get testosterone and psa in 6 months or so at [facility]. Also refill cialis

## 2017-12-15 NOTE — Assessment & Plan Note (Signed)
S: weight down 5 lbs in last 6 months or so. This is ideal both for his CBGs and lipids. Of note was not fully fasting at last labs.  A/P: continue to work on weight loss- recheck lipids and fasting CBG at next visit ([facility])
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
## 2017-12-19 ENCOUNTER — Telehealth: Payer: Self-pay

## 2017-12-19 NOTE — Telephone Encounter (Signed)
Received documentation from [facility] that patient's Tadalafil 2.5mg  was approved from 11/18/2017 through 12/18/2018.

## 2017-12-21 ENCOUNTER — Ambulatory Visit: Payer: BLUE CROSS/BLUE SHIELD | Admitting: Neurology

## 2017-12-21 ENCOUNTER — Encounter: Payer: Self-pay | Admitting: Neurology

## 2017-12-21 VITALS — BP 136/88 | HR 92 | Ht 68.0 in | Wt 222.0 lb

## 2017-12-21 DIAGNOSIS — G43709 Chronic migraine without aura, not intractable, without status migrainosus: Secondary | ICD-10-CM

## 2017-12-21 MED ORDER — ERENUMAB-AOOE 70 MG/ML ~~LOC~~ SOAJ: 140.0000 mg | SUBCUTANEOUS | 11 refills | Status: DC

## 2017-12-21 MED ORDER — DIHYDROERGOTAMINE MESYLATE 4 MG/ML NA SOLN: 1.0000 | NASAL | 0 refills | Status: DC | PRN

## 2017-12-21 NOTE — Patient Instructions (Signed)
1.  We will increase Aimovig to 140mg  monthly 2.  Stop Relpax.  Instead, try Migranal: 1 actuation in each nostril.  May repeat every 15 minutes x2.  Do not exceed 6 actuations in 24 hours or 8 actuations per week. 3.  Try to limit Fioricet or any pain reliever to no more than 2 days out of week. 4.  Headache diary 5.  Follow up in 3 months.

## 2017-12-21 NOTE — Progress Notes (Signed)
NEUROLOGY FOLLOW UP OFFICE NOTE  Steven Holland [id number]  HISTORY OF PRESENT ILLNESS: Steven Holland is a 47 year old man who follows up for chronic migraine.  UPDATE: He started Aimovig in November.  While intensity has improved, frequency is unchanged. Intensity:  4-6/10 about 2-3 days a week, otherwise 1-2/10.  Only 2 migraines were 8/10 since November. Duration:  2 hours with Relpax (if effective) or Fioricet.  Otherwise several hours. Frequency:  4 to 5 days a week Current NSAIDS:  no Current analgesics:  Fioricet with codeine Current triptans:  Relpax 40mg  (only effective 50% of time for headaches up to 3/10) Current anti-emetic:  Zofran 8mg  Current muscle relaxants:  no Current anti-anxiolytic:  no Current sleep aide:  no Current Antihypertensive medications:  HCTZ Current Antidepressant medications:  no Current Anticonvulsant medications:  no Current anti-CGRP:  Aimovig 70mg  monthly Current Vitamins/Herbal/Supplements:  no Current Antihistamines/Decongestants:  no Other therapy:  Chiropractic medicine (effective)   Caffeine:  Down to 1 or less Cokes per day Alcohol:  rarely Smoker:  no Diet:  Drinks over two 16 oz bottles of water daily Exercise:  Rides an elliptical about 3 times a week. Depression/anxiety:  no Sleep hygiene:  5 hours a night  HISTORY:  Onset:  "all my life" Location:  Back of head/neck or bi-temporal.  Quality:  pounding Initial intensity:  Varies from 2-3/10 to 5-6/10, rarely 7-8/10 Aura:  no Prodrome:  no Postdrome:  no Associated symptoms:  Nausea, photophobia, phonophobia.  He denies thunderclap headache. Initial Duration:  All day or days if not pain reliever. Otherwise, 2 to 4 hours.  Sometimes wakes up with headache Initial Frequency:  4-5 days a week. Initial Frequency of abortive medication: 4-5 days a week Triggers/exacerbating factors:  Change in barometric pressure, allergies Relieving factors:  no Activity:   aggravates   Past NSAIDS:  ibuprofen Past analgesics:  Tylenol, vicodin (worked), Geophysicist/field seismologistioricet with Codeine, Excedrin Migraine Past abortive triptans:  Sumatriptan tablet, Maxalt Past muscle relaxants:  no Past anti-emetic:  no Past antihypertensive medications:  bisoprolol Past antidepressant medications:  no Past anticonvulsant medications:  topiramate 100mg  (paresthesias, impotence) Past vitamins/Herbal/Supplements:  no Other past therapies:  no   Family history of headache:  Mother's side  PAST MEDICAL HISTORY: Past Medical History:  Diagnosis Date  . Chicken pox   . Hypertension   . Low testosterone   . Syncope    related to IBS and severe nausea    MEDICATIONS: Current Outpatient Medications on File Prior to Visit  Medication Sig Dispense Refill  . butalbital-acetaminophen-caffeine (FIORICET WITH CODEINE) 50-325-40-30 MG capsule TAKE ONE CAPSULE BY MOUTH EVERY 4 HOURS AS NEEDED 10 capsule 5  . eletriptan (RELPAX) 40 MG tablet TAKE 1 TABLET AT EARLIEST ONSET OF MIGRAINE. MAY REPEAT ONCE IN 2 HRS IF HEADACHE PERSISTS/RECURS 10 tablet 2  . Erenumab-aooe (AIMOVIG) 70 MG/ML SOAJ Inject 70 mg into the skin every 30 (thirty) days. 1 pen 11  . folic acid (FOLVITE) 400 MCG tablet Take 400 mcg by mouth daily.    . hydrochlorothiazide (MICROZIDE) 12.5 MG capsule Take 1 capsule (12.5 mg total) by mouth daily. 90 capsule 3  . Magnesium 250 MG TABS Take 1 tablet by mouth daily.    . Multiple Vitamin (MULITIVITAMIN WITH MINERALS) TABS Take 1 tablet by mouth daily.    . naproxen (NAPROSYN) 500 MG tablet Take 1 tablet (500 mg total) by mouth every 12 (twelve) hours as needed. 15 tablet 2  . omeprazole (  PRILOSEC) 20 MG capsule TAKE 1 CAPSULE (20 MG TOTAL) BY MOUTH DAILY. 90 capsule 2  . ondansetron (ZOFRAN) 8 MG tablet TAKE 1 TABLET (8 MG TOTAL) BY MOUTH EVERY 8 (EIGHT) HOURS AS NEEDED. 9 tablet 11  . Potassium Bicarbonate 99 MG CAPS Take 1 capsule by mouth daily.    . Tadalafil (CIALIS) 2.5  MG TABS TAKE 1 TABLET (2.5 MG TOTAL) BY MOUTH DAILY. 90 tablet 1  . Testosterone 20.25 MG/ACT (1.62%) GEL APPLY 4 PUMPS TOPICALLY DAILY. 450 g 5  . vitamin B-12 (CYANOCOBALAMIN) 1000 MCG tablet Take 1,000 mcg by mouth daily.     No current facility-administered medications on file prior to visit.     ALLERGIES: Allergies  Allergen Reactions  . Sulfa Antibiotics Swelling    FAMILY HISTORY: Family History  Problem Relation Age of Onset  . Arthritis Mother        knee replacements and back  . Heart disease Father        30s reportedly- patient no issues  . Benign prostatic hyperplasia Father   . Other Brother        hypogonadism/low T both brothers  . Migraines Brother        bothbrothers  . Prostate cancer Maternal Grandfather   . Diabetes Paternal Grandmother     SOCIAL HISTORY: Social History   Socioeconomic History  . Marital status: Single    Spouse name: Not on file  . Number of children: Not on file  . Years of education: Not on file  . Highest education level: Not on file  Occupational History  . Not on file  Social Needs  . Financial resource strain: Not on file  . Food insecurity:    Worry: Not on file    Inability: Not on file  . Transportation needs:    Medical: Not on file    Non-medical: Not on file  Tobacco Use  . Smoking status: Never Smoker  . Smokeless tobacco: Never Used  Substance and Sexual Activity  . Alcohol use: Yes    Comment: rare  . Drug use: No  . Sexual activity: Yes  Lifestyle  . Physical activity:    Days per week: Not on file    Minutes per session: Not on file  . Stress: Not on file  Relationships  . Social connections:    Talks on phone: Not on file    Gets together: Not on file    Attends religious service: Not on file    Active member of club or organization: Not on file    Attends meetings of clubs or organizations: Not on file    Relationship status: Not on file  . Intimate partner violence:    Fear of current or  ex partner: Not on file    Emotionally abused: Not on file    Physically abused: Not on file    Forced sexual activity: Not on file  Other Topics Concern  . Not on file  Social History Narrative   Married wife [name]. No children.       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 Very busy at work. Enjoys Psychologist, educational. Household projects and Holiday representative.     REVIEW OF SYSTEMS: Constitutional: No fevers, chills, or sweats, no generalized fatigue, change in appetite Eyes: No visual changes, double vision, eye pain Ear, nose and throat: No hearing loss, ear pain, nasal congestion, sore throat Cardiovascular: No chest pain, palpitations Respiratory:  No shortness of breath at rest or with exertion, wheezes GastrointestinaI: No nausea, vomiting, diarrhea, abdominal pain, fecal incontinence Genitourinary:  No dysuria, urinary retention or frequency Musculoskeletal:  No neck pain, back pain Integumentary: No rash, pruritus, skin lesions Neurological: as above Psychiatric: No depression, insomnia, anxiety Endocrine: No palpitations, fatigue, diaphoresis, mood swings, change in appetite, change in weight, increased thirst Hematologic/Lymphatic:  No purpura, petechiae. Allergic/Immunologic: no itchy/runny eyes, nasal congestion, recent allergic reactions, rashes  PHYSICAL EXAM: Vitals:   12/21/17 1122  BP: 136/88  Pulse: 92  SpO2: 97%   General: No acute distress.  Patient appears well-groomed.  Head:  Normocephalic/atraumatic Eyes:  Fundi examined but not visualized Neck: supple, no paraspinal tenderness, full range of motion Heart:  Regular rate and rhythm Lungs:  Clear to auscultation bilaterally Back: No paraspinal tenderness Neurological Exam: alert and oriented to person, place, and time. Attention span and concentration intact, recent and remote memory intact, fund of knowledge intact.  Speech fluent and not dysarthric, language intact.  CN II-XII  intact. Bulk and tone normal, muscle strength 5/5 throughout.  Sensation to light touch  intact.  Deep tendon reflexes 2+ throughout.  Finger to nose testing intact.  Gait normal, Romberg negative.  IMPRESSION: Chronic migraine without aura, not intractable, without status migrainosus  PLAN: 1.  We will increase Aimovig to 140mg  monthly 2.  Stop Relpax.  Instead, try Migranal 3.  Urged to try to limit Fioricet as much as possible.  Limit all pain reliever to no more than 2 days out of week. 4.  Headache diary 5.  Follow up in 3 months.  Shon Millet, DO  CC: Dr. Durene Cal

## 2017-12-29 ENCOUNTER — Encounter: Payer: Self-pay | Admitting: Neurology

## 2018-01-17 NOTE — Progress Notes (Signed)
Initiateed on cover my meds KEY: Darnelle Spangle case ID# [id number] 12/18/17 - 01/17/19  Called [facility] and advsd, spoke with Trish.

## 2018-02-05 ENCOUNTER — Encounter: Payer: Self-pay | Admitting: Family Medicine

## 2018-02-06 ENCOUNTER — Other Ambulatory Visit: Payer: Self-pay

## 2018-02-06 DIAGNOSIS — E291 Testicular hypofunction: Secondary | ICD-10-CM

## 2018-02-06 MED ORDER — TESTOSTERONE 20.25 MG/ACT (1.62%) TD GEL: TRANSDERMAL | 5 refills | Status: DC

## 2018-02-06 NOTE — Telephone Encounter (Signed)
Pharmacy says Testosterone 20.25 MG/ACT (1.62%) GEL sent today was not received. Please resend.

## 2018-02-07 ENCOUNTER — Other Ambulatory Visit: Payer: Self-pay

## 2018-02-07 DIAGNOSIS — E291 Testicular hypofunction: Secondary | ICD-10-CM

## 2018-02-07 MED ORDER — TESTOSTERONE 20.25 MG/ACT (1.62%) TD GEL: TRANSDERMAL | 5 refills | Status: DC

## 2018-02-20 ENCOUNTER — Encounter (HOSPITAL_COMMUNITY): Payer: Self-pay | Admitting: Emergency Medicine

## 2018-02-20 ENCOUNTER — Ambulatory Visit (INDEPENDENT_AMBULATORY_CARE_PROVIDER_SITE_OTHER): Payer: BLUE CROSS/BLUE SHIELD

## 2018-02-20 ENCOUNTER — Ambulatory Visit (HOSPITAL_COMMUNITY)
Admission: EM | Admit: 2018-02-20 | Discharge: 2018-02-20 | Disposition: A | Discharge status: Home / Self Care | Payer: BLUE CROSS/BLUE SHIELD | Attending: Internal Medicine | Admitting: Internal Medicine

## 2018-02-20 DIAGNOSIS — R0781 Pleurodynia: Secondary | ICD-10-CM

## 2018-02-20 DIAGNOSIS — M940 Chondrocostal junction syndrome [Tietze]: Secondary | ICD-10-CM

## 2018-02-20 DIAGNOSIS — R000 Tachycardia, unspecified: Secondary | ICD-10-CM

## 2018-02-20 MED ORDER — PREDNISONE 10 MG (21) PO TBPK: ORAL_TABLET | ORAL | 0 refills | Status: DC

## 2018-02-20 NOTE — ED Provider Notes (Signed)
[facility]    CSN: [id number] Arrival date & time: 02/20/18  1643     History   Chief Complaint Chief Complaint  Patient presents with  . Back Pain  . Abdominal Pain    HPI Steven Holland is a 47 y.o. male.   Steven Holland presents with complaints of spot on his back which feels sensitive for the past two weeks. He states light touch, such as from his shirt, feels irritating to the area. At times feels like a muscular twinge. He felt like it might be like a "pimple" or similar but has not seen anything- has had his wife look to his back. Also for the past week has had left anterolateral rib pain. Worse with movement of the affected area. No shortness of breath . No palpitations. Not worse with activity unless it is movement of affected region. Deep breathing to move the ribcage irritates it. States pain is very mild, rates is 2/10. No pain if just sitting without movement. Denies any previous similar. No nausea, vomiting, diarrhea, diaphoresis, arm or jaw pain. Does not smoke. Hx of htn and hyperlipidemia. Family history of cardiac events- father had bypass. Has intermittently taken naproxen which does not help. Hx htn, low testosterone, syncope, hyperlipidemia, migraines.    ROS per HPI.      Past Medical History:  Diagnosis Date  . Chicken pox   . Hypertension   . Low testosterone   . Syncope    related to IBS and severe nausea    Patient Active Problem List   Diagnosis Date Noted  . Hyperglycemia 05/09/2017  . Hyperlipidemia 11/04/2016  . Obesity, Class I, BMI 30-34.9 10/22/2015  . Hypogonadism male 12/18/2011  . ED (erectile dysfunction) 12/18/2011  . HTN (hypertension) 12/18/2011  . Migraine headache with aura 12/18/2011  . AR (allergic rhinitis) 12/18/2011  . GERD (gastroesophageal reflux disease) 12/18/2011  . Irritable bowel syndrome (IBS) 12/18/2011    Past Surgical History:  Procedure Laterality Date  . none         Home Medications     Prior to Admission medications   Medication Sig Start Date End Date Taking? Authorizing Provider  butalbital-acetaminophen-caffeine (FIORICET WITH CODEINE) [phone] MG capsule TAKE ONE CAPSULE BY MOUTH EVERY 4 HOURS AS NEEDED 11/15/17  Yes Shelva Majestic, MD  dihydroergotamine (MIGRANAL) 4 MG/ML nasal spray Place 1 spray into the nose as needed for migraine. Use in one nostril as directed. 12/21/17  Yes Jaffe, Adam R, DO  Erenumab-aooe (AIMOVIG 140 DOSE) 70 MG/ML SOAJ Inject 140 mg into the skin every 30 (thirty) days. 12/21/17  Yes Jaffe, Adam R, DO  Erenumab-aooe (AIMOVIG) 70 MG/ML SOAJ Inject 70 mg into the skin every 30 (thirty) days. 06/08/17  Yes Jaffe, Adam R, DO  folic acid (FOLVITE) 400 MCG tablet Take 400 mcg by mouth daily.   Yes [provider]  hydrochlorothiazide (MICROZIDE) 12.5 MG capsule Take 1 capsule (12.5 mg total) by mouth daily. 10/13/17  Yes Shelva Majestic, MD  Magnesium 250 MG TABS Take 1 tablet by mouth daily.   Yes [provider]  Multiple Vitamin (MULITIVITAMIN WITH MINERALS) TABS Take 1 tablet by mouth daily.   Yes [provider]  omeprazole (PRILOSEC) 20 MG capsule TAKE 1 CAPSULE (20 MG TOTAL) BY MOUTH DAILY. 08/23/17  Yes Wallis Bamberg, PA-C  ondansetron (ZOFRAN) 8 MG tablet TAKE 1 TABLET (8 MG TOTAL) BY MOUTH EVERY 8 (EIGHT) HOURS AS NEEDED. 08/10/17  Yes Shelva Majestic,  MD  Potassium Bicarbonate 99 MG CAPS Take 1 capsule by mouth daily.   Yes [provider]  Tadalafil (CIALIS) 2.5 MG TABS TAKE 1 TABLET (2.5 MG TOTAL) BY MOUTH DAILY. 12/15/17  Yes Shelva Majestic, MD  Testosterone 20.25 MG/ACT (1.62%) GEL APPLY 4 PUMPS TOPICALLY DAILY. 02/07/18  Yes Shelva Majestic, MD  vitamin B-12 (CYANOCOBALAMIN) 1000 MCG tablet Take 1,000 mcg by mouth daily.   Yes [provider]  eletriptan (RELPAX) 40 MG tablet TAKE 1 TABLET AT EARLIEST ONSET OF MIGRAINE. MAY REPEAT ONCE IN 2 HRS IF HEADACHE PERSISTS/RECURS 10/30/17   Everlena Cooper,  Adam R, DO  naproxen (NAPROSYN) 500 MG tablet Take 1 tablet (500 mg total) by mouth every 12 (twelve) hours as needed. 06/08/17   Drema Dallas, DO  predniSONE (STERAPRED UNI-PAK 21 TAB) 10 MG (21) TBPK tablet Per box instructions 02/20/18   Georgetta Haber, NP    Family History Family History  Problem Relation Age of Onset  . Arthritis Mother        knee replacements and back  . Heart disease Father        30s reportedly- patient no issues  . Benign prostatic hyperplasia Father   . Other Brother        hypogonadism/low T both brothers  . Migraines Brother        bothbrothers  . Prostate cancer Maternal Grandfather   . Diabetes Paternal Grandmother     Social History Social History   Tobacco Use  . Smoking status: Never Smoker  . Smokeless tobacco: Never Used  Substance Use Topics  . Alcohol use: Yes    Comment: rare  . Drug use: No     Allergies   Sulfa antibiotics   Review of Systems Review of Systems   Physical Exam Triage Vital Signs ED Triage Vitals [02/20/18 1713]  Enc Vitals Group     BP (!) 161/112     Pulse Rate (!) 102     Resp 16     Temp 98.2 F (36.8 C)     Temp Source Oral     SpO2 98 %     Weight 210 lb (95.3 kg)     Height      Head Circumference      Peak Flow      Pain Score 2     Pain Loc      Pain Edu?      Excl. in GC?    No data found.  Updated Vital Signs BP (!) 161/112   Pulse (!) 102   Temp 98.2 F (36.8 C) (Oral)   Resp 16   Wt 210 lb (95.3 kg)   SpO2 98%   BMI 31.93 kg/m    Physical Exam  Constitutional: He is oriented to person, place, and time. He appears well-developed and well-nourished.  Cardiovascular: Regular rhythm. Tachycardia present.  Mild tachycardia noted; ekg with NSR, rate 100 without acute changes noted   Pulmonary/Chest: Effort normal and breath sounds normal. He exhibits tenderness.  Point tenderness to left anterolateral ribs, approximately #6-8; worse with twisting of torso as well; lungs  clear, no increased work of breathing, no tachypnea     Neurological: He is alert and oriented to person, place, and time.  Skin: Skin is warm and dry.  Generalized maculopapular rash to back, blanchable without itching     UC Treatments / Results  Labs (all labs ordered are listed, but only abnormal results are displayed)  Labs Reviewed - No data to display  EKG None  Radiology Dg Ribs Unilateral W/chest Left  Result Date: 02/20/2018 CLINICAL DATA:  47 year old male with 2 weeks of left posterior back pain, now with anterior left chest wall pain and soft tissue swelling. Left rib pain. EXAM: LEFT RIBS AND CHEST - 3+ VIEW COMPARISON:  None. FINDINGS: Normal lung volumes. Normal cardiac size and mediastinal contours. Visualized tracheal air column is within normal limits. No pneumothorax, pulmonary edema, pleural effusion or confluent pulmonary opacity. Negative visible bowel gas pattern. Oblique views of the left ribs. Bone mineralization is within normal limits. Normal thoracic segmentation. No left rib fracture or left rib lesion identified. Other visible osseous structures appear intact. Mild degenerative endplate spurring in the visible lumbar spine. IMPRESSION: 1. No radiographic left rib abnormality. 2. Negative chest.  No acute cardiopulmonary abnormality. Electronically Signed   By: Odessa FlemingH  Hall M.D.   On: 02/20/2018 18:11    Procedures Procedures (including critical care time)  Medications Ordered in UC Medications - No data to display  Initial Impression / Assessment and Plan / UC Course  I have reviewed the triage vital signs and the nursing notes.  Pertinent labs & imaging results that were available during my care of the patient were reviewed by me and considered in my medical decision making (see chart for details).     Chest xray and ekg reassuring today. Symptoms for the past two weeks. Reproducible with palpation on exam and with movement of affected area. Appears  likely musculoskeletal in nature. Non specific rash to back without any localized findings as patient is experiencing. Will provide short course of steroids, ice pack. Encouraged follow up for recheck with PCP in the next week. Return precautions provided. Patient verbalized understanding and agreeable to plan.   Final Clinical Impressions(s) / UC Diagnoses   Final diagnoses:  Costochondritis     Discharge Instructions     Ice to affected area.  Please complete prednisone pack.  Please follow up with your primary care provider for recheck in the next 1-2 weeks. If worsening of symptoms, shortness of breath , chest pain, sweating, weakness, arm or jaw pain please return or go to Er.      ED Prescriptions    Medication Sig Dispense Auth. Provider   predniSONE (STERAPRED UNI-PAK 21 TAB) 10 MG (21) TBPK tablet Per box instructions 21 tablet Georgetta HaberBurky, Skipper Dacosta B, NP     Controlled Substance Prescriptions [place] Controlled Substance Registry consulted? Not Applicable   Georgetta HaberBurky, Sophya Vanblarcom B, NP 02/20/18 1842

## 2018-02-20 NOTE — Discharge Instructions (Addendum)
Ice to affected area.  Please complete prednisone pack.  Please follow up with your primary care provider for recheck in the next 1-2 weeks. If worsening of symptoms, shortness of breath , chest pain, sweating, weakness, arm or jaw pain please return or go to Er.

## 2018-02-20 NOTE — ED Triage Notes (Signed)
PT reports pain over left back that is worse when something brushes across area. PT reports no rash / skin discoloration. PT reports pain on back sometimes feels like a burning sensation. PT reports back pain for 2 weeks. PT reports pain over left ribs for 1 week.

## 2018-03-23 ENCOUNTER — Encounter: Payer: Self-pay | Admitting: Neurology

## 2018-03-23 ENCOUNTER — Ambulatory Visit: Payer: BLUE CROSS/BLUE SHIELD | Admitting: Neurology

## 2018-03-23 VITALS — BP 140/90 | HR 96 | Ht 68.0 in | Wt 226.4 lb

## 2018-03-23 DIAGNOSIS — G43709 Chronic migraine without aura, not intractable, without status migrainosus: Secondary | ICD-10-CM | POA: Diagnosis not present

## 2018-03-23 DIAGNOSIS — G43809 Other migraine, not intractable, without status migrainosus: Secondary | ICD-10-CM | POA: Diagnosis not present

## 2018-03-23 DIAGNOSIS — G44211 Episodic tension-type headache, intractable: Secondary | ICD-10-CM | POA: Diagnosis not present

## 2018-03-23 MED ORDER — NORTRIPTYLINE HCL 25 MG PO CAPS: 25.0000 mg | ORAL_CAPSULE | Freq: Every day | ORAL | 3 refills | Status: DC

## 2018-03-23 MED ORDER — BUTALBITAL-APAP-CAFF-COD 50-300-40-30 MG PO CAPS: 1.0000 | ORAL_CAPSULE | Freq: Four times a day (QID) | ORAL | 3 refills | Status: DC | PRN

## 2018-03-23 MED ORDER — BUTALBITAL-APAP-CAFFEINE 50-325-40 MG PO TABS: 1.0000 | ORAL_TABLET | Freq: Four times a day (QID) | ORAL | 3 refills | Status: DC | PRN

## 2018-03-23 NOTE — Patient Instructions (Signed)
1.  In addition to Aimovig, we will start nortriptyline 25mg  at bedtime.  Contact me in 4 weeks if we need to increase dose. 2.  For abortive therapy, try the Sprix nasal spray, 1 spray in both nostrils.  May repeat every 6 to 8 hours up to 4 doses in 24 hours.  As second line, you may take Fioricet. 3.  Follow up in 3 to 4 months.

## 2018-03-23 NOTE — Progress Notes (Signed)
NEUROLOGY FOLLOW UP OFFICE NOTE  DERICK SEMINARA [id number]  HISTORY OF PRESENT ILLNESS: Steven Holland is a 47 year old man who follows up for chronic migraine.   UPDATE: Aimovig has reduced intensity but not frequency of his migraines.  Migranal was ineffective. Intensity:  Moderate to severe Duration:  Several hours.  2 hours with Fioricet with codeine Frequency:  13 days in June, 2 days severe, missed some work on 4 days Current NSAIDS:  no Current analgesics:  Fioricet with codeine Current triptans:  no Current ergot:  Migranal Current anti-emetic:  Zofran 8mg  Current muscle relaxants:  no Current anti-anxiolytic:  no Current sleep aide:  no Current Antihypertensive medications:  HCTZ Current Antidepressant medications:  no Current Anticonvulsant medications:  no Current anti-CGRP:  Aimovig 140mg  monthly Current Vitamins/Herbal/Supplements:  no Current Antihistamines/Decongestants:  no Other therapy:  Chiropractic medicine (effective)   Caffeine:  Down to 1 or less Cokes per day Alcohol:  rarely Smoker:  no Diet:  Drinks over two 16 oz bottles of water daily Exercise:  Rides an elliptical about 3 times a week. Depression/anxiety:  no Sleep hygiene:  5 hours a night   HISTORY:  Onset:  "all my life" Location:  Back of head/neck or bi-temporal.  Quality:  pounding Initial intensity:  Varies from 2-3/10 to 5-6/10, rarely 7-8/10 Aura:  no Prodrome:  no Postdrome:  no Associated symptoms:  Nausea, photophobia, phonophobia.  He denies thunderclap headache. Initial Duration:  All day or days if not pain reliever. Otherwise, 2 to 4 hours.  Sometimes wakes up with headache Initial Frequency:  4-5 days a week. Initial Frequency of abortive medication: 4-5 days a week Triggers/exacerbating factors:  Change in barometric pressure, allergies Relieving factors:  no Activity:  aggravates   Past NSAIDS:  ibuprofen Past analgesics:  Tylenol, vicodin (worked),  Geophysicist/field seismologist with Codeine, Excedrin Migraine Past abortive triptans:  Sumatriptan tablet, Maxalt Relpax Past muscle relaxants:  no Past anti-emetic:  no Past antihypertensive medications:  bisoprolol Past antidepressant medications:  no Past anticonvulsant medications:  topiramate 100mg  (paresthesias, impotence) Past vitamins/Herbal/Supplements:  no Other past therapies:  no   Family history of headache:  Mother's side  PAST MEDICAL HISTORY: Past Medical History:  Diagnosis Date  . Chicken pox   . Hypertension   . Low testosterone   . Syncope    related to IBS and severe nausea    MEDICATIONS: Current Outpatient Medications on File Prior to Visit  Medication Sig Dispense Refill  . Erenumab-aooe (AIMOVIG 140 DOSE) 70 MG/ML SOAJ Inject 140 mg into the skin every 30 (thirty) days. 2 pen 11  . folic acid (FOLVITE) 400 MCG tablet Take 400 mcg by mouth daily.    . hydrochlorothiazide (MICROZIDE) 12.5 MG capsule Take 1 capsule (12.5 mg total) by mouth daily. 90 capsule 3  . Magnesium 250 MG TABS Take 1 tablet by mouth daily.    . Multiple Vitamin (MULITIVITAMIN WITH MINERALS) TABS Take 1 tablet by mouth daily.    . naproxen (NAPROSYN) 500 MG tablet Take 1 tablet (500 mg total) by mouth every 12 (twelve) hours as needed. 15 tablet 2  . omeprazole (PRILOSEC) 20 MG capsule TAKE 1 CAPSULE (20 MG TOTAL) BY MOUTH DAILY. 90 capsule 2  . ondansetron (ZOFRAN) 8 MG tablet TAKE 1 TABLET (8 MG TOTAL) BY MOUTH EVERY 8 (EIGHT) HOURS AS NEEDED. 9 tablet 11  . Potassium Bicarbonate 99 MG CAPS Take 1 capsule by mouth daily.    . Tadalafil (CIALIS)  2.5 MG TABS TAKE 1 TABLET (2.5 MG TOTAL) BY MOUTH DAILY. 90 tablet 1  . Testosterone 20.25 MG/ACT (1.62%) GEL APPLY 4 PUMPS TOPICALLY DAILY. 450 g 5  . vitamin B-12 (CYANOCOBALAMIN) 1000 MCG tablet Take 1,000 mcg by mouth daily.     No current facility-administered medications on file prior to visit.     ALLERGIES: Allergies  Allergen Reactions  . Sulfa  Antibiotics Swelling    FAMILY HISTORY: Family History  Problem Relation Age of Onset  . Arthritis Mother        knee replacements and back  . Heart disease Father        30s reportedly- patient no issues  . Benign prostatic hyperplasia Father   . Other Brother        hypogonadism/low T both brothers  . Migraines Brother        bothbrothers  . Prostate cancer Maternal Grandfather   . Diabetes Paternal Grandmother     SOCIAL HISTORY: Social History   Socioeconomic History  . Marital status: Married    Spouse name: Not on file  . Number of children: Not on file  . Years of education: Not on file  . Highest education level: Not on file  Occupational History  . Not on file  Social Needs  . Financial resource strain: Not on file  . Food insecurity:    Worry: Not on file    Inability: Not on file  . Transportation needs:    Medical: Not on file    Non-medical: Not on file  Tobacco Use  . Smoking status: Never Smoker  . Smokeless tobacco: Never Used  Substance and Sexual Activity  . Alcohol use: Yes    Comment: rare  . Drug use: No  . Sexual activity: Yes  Lifestyle  . Physical activity:    Days per week: Not on file    Minutes per session: Not on file  . Stress: Not on file  Relationships  . Social connections:    Talks on phone: Not on file    Gets together: Not on file    Attends religious service: Not on file    Active member of club or organization: Not on file    Attends meetings of clubs or organizations: Not on file    Relationship status: Not on file  . Intimate partner violence:    Fear of current or ex partner: Not on file    Emotionally abused: Not on file    Physically abused: Not on file    Forced sexual activity: Not on file  Other Topics Concern  . Not on file  Social History Narrative   Married wife Stanton KidneyDebra. No children.       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 Very busy at work. Enjoys Psychologist, educationalart.  Household projects and Holiday representativeconstruction.     REVIEW OF SYSTEMS: Constitutional: No fevers, chills, or sweats, no generalized fatigue, change in appetite Eyes: No visual changes, double vision, eye pain Ear, nose and throat: No hearing loss, ear pain, nasal congestion, sore throat Cardiovascular: No chest pain, palpitations Respiratory:  No shortness of breath at rest or with exertion, wheezes GastrointestinaI: No nausea, vomiting, diarrhea, abdominal pain, fecal incontinence Genitourinary:  No dysuria, urinary retention or frequency Musculoskeletal:  No neck pain, back pain Integumentary: No rash, pruritus, skin lesions Neurological: as above Psychiatric: No depression, insomnia, anxiety Endocrine: No palpitations, fatigue, diaphoresis, mood swings, change in appetite,  change in weight, increased thirst Hematologic/Lymphatic:  No purpura, petechiae. Allergic/Immunologic: no itchy/runny eyes, nasal congestion, recent allergic reactions, rashes  PHYSICAL EXAM: Vitals:   03/23/18 1456  BP: 140/90  Pulse: 96  SpO2: 98%   General: No acute distress.  Patient appears well-groomed.    IMPRESSION: Chronic migraine without aura, without status migrainosus, not intractable  PLAN: 1.  In addition to Aimovig, we will start nortriptyline 25mg  at bedtime.  Contact me in 4 weeks if we need to increase dose. 2.  For abortive therapy, he will try the Sprix nasal spray.  He may use the Fioricet with codeine as second line.  Since the Fioricet with codeine has been the only medication (along with Vicodin) that quickly aborts his headache thus far, I will make an exception and refill it, allotted only 15 capsules in 30 days.  We again discussed risk of rebound headache. 3.  Continue use of headache diary 4.  Follow up in 3 to 4 months.  25 minutes spent face to face with patient, 100% spent discussing management.  Shon Millet, DO  CC:  Tana Conch, MD

## 2018-03-28 ENCOUNTER — Other Ambulatory Visit: Payer: Self-pay | Admitting: Neurology

## 2018-03-28 NOTE — Telephone Encounter (Signed)
Called Pt, LMOVM asking if he requested eletriptan or if was auto refill

## 2018-03-28 NOTE — Telephone Encounter (Signed)
Sakib called back to say he does not need a refill.

## 2018-04-03 ENCOUNTER — Telehealth: Payer: Self-pay | Admitting: Neurology

## 2018-04-03 NOTE — Telephone Encounter (Signed)
Received request to obtain prior authorization for Sprix. CaseId:[id number];Status:Approved;Review Type:Prior Auth;Coverage Start Date:03/04/2018;Coverage End Date:04/03/2019.

## 2018-05-14 ENCOUNTER — Inpatient Hospital Stay (HOSPITAL_COMMUNITY)
Admission: EM | Admit: 2018-05-14 | Discharge: 2018-05-17 | DRG: 872 | Disposition: A | Discharge status: Home / Self Care | Payer: BLUE CROSS/BLUE SHIELD | Source: Ambulatory Visit | Attending: Internal Medicine | Admitting: Internal Medicine

## 2018-05-14 ENCOUNTER — Emergency Department (HOSPITAL_COMMUNITY): Payer: BLUE CROSS/BLUE SHIELD

## 2018-05-14 ENCOUNTER — Other Ambulatory Visit: Payer: Self-pay

## 2018-05-14 ENCOUNTER — Ambulatory Visit (HOSPITAL_COMMUNITY)
Admission: EM | Admit: 2018-05-14 | Discharge: 2018-05-14 | Disposition: A | Discharge status: Home / Self Care | Payer: BLUE CROSS/BLUE SHIELD

## 2018-05-14 ENCOUNTER — Encounter (HOSPITAL_COMMUNITY): Payer: Self-pay | Admitting: Emergency Medicine

## 2018-05-14 DIAGNOSIS — G43909 Migraine, unspecified, not intractable, without status migrainosus: Secondary | ICD-10-CM | POA: Diagnosis present

## 2018-05-14 DIAGNOSIS — E872 Acidosis, unspecified: Secondary | ICD-10-CM

## 2018-05-14 DIAGNOSIS — R6520 Severe sepsis without septic shock: Secondary | ICD-10-CM | POA: Diagnosis present

## 2018-05-14 DIAGNOSIS — Z6834 Body mass index (BMI) 34.0-34.9, adult: Secondary | ICD-10-CM

## 2018-05-14 DIAGNOSIS — K219 Gastro-esophageal reflux disease without esophagitis: Secondary | ICD-10-CM | POA: Diagnosis present

## 2018-05-14 DIAGNOSIS — E669 Obesity, unspecified: Secondary | ICD-10-CM | POA: Diagnosis present

## 2018-05-14 DIAGNOSIS — A419 Sepsis, unspecified organism: Secondary | ICD-10-CM | POA: Diagnosis present

## 2018-05-14 DIAGNOSIS — Z8719 Personal history of other diseases of the digestive system: Secondary | ICD-10-CM

## 2018-05-14 DIAGNOSIS — Z79899 Other long term (current) drug therapy: Secondary | ICD-10-CM

## 2018-05-14 DIAGNOSIS — R402360 Coma scale, best motor response, obeys commands, unspecified time: Secondary | ICD-10-CM | POA: Diagnosis present

## 2018-05-14 DIAGNOSIS — Z91011 Allergy to milk products: Secondary | ICD-10-CM

## 2018-05-14 DIAGNOSIS — K3189 Other diseases of stomach and duodenum: Secondary | ICD-10-CM | POA: Diagnosis present

## 2018-05-14 DIAGNOSIS — Z882 Allergy status to sulfonamides status: Secondary | ICD-10-CM

## 2018-05-14 DIAGNOSIS — R402250 Coma scale, best verbal response, oriented, unspecified time: Secondary | ICD-10-CM | POA: Diagnosis present

## 2018-05-14 DIAGNOSIS — I10 Essential (primary) hypertension: Secondary | ICD-10-CM | POA: Diagnosis present

## 2018-05-14 DIAGNOSIS — E876 Hypokalemia: Secondary | ICD-10-CM | POA: Diagnosis present

## 2018-05-14 DIAGNOSIS — N179 Acute kidney failure, unspecified: Secondary | ICD-10-CM | POA: Diagnosis present

## 2018-05-14 DIAGNOSIS — R112 Nausea with vomiting, unspecified: Secondary | ICD-10-CM

## 2018-05-14 DIAGNOSIS — K2960 Other gastritis without bleeding: Secondary | ICD-10-CM | POA: Diagnosis not present

## 2018-05-14 DIAGNOSIS — R402140 Coma scale, eyes open, spontaneous, unspecified time: Secondary | ICD-10-CM | POA: Diagnosis present

## 2018-05-14 DIAGNOSIS — R9431 Abnormal electrocardiogram [ECG] [EKG]: Secondary | ICD-10-CM | POA: Diagnosis present

## 2018-05-14 DIAGNOSIS — R1084 Generalized abdominal pain: Secondary | ICD-10-CM

## 2018-05-14 LAB — CBC WITH DIFFERENTIAL/PLATELET
Abs Immature Granulocytes: 0.1 10*3/uL (ref 0.0–0.1)
Basophils Absolute: 0 10*3/uL (ref 0.0–0.1)
Basophils Relative: 0 %
EOS ABS: 0.1 10*3/uL (ref 0.0–0.7)
Eosinophils Relative: 1 %
HEMATOCRIT: 54.6 % — AB (ref 39.0–52.0)
HEMOGLOBIN: 17.9 g/dL — AB (ref 13.0–17.0)
Immature Granulocytes: 1 %
LYMPHS ABS: 2.2 10*3/uL (ref 0.7–4.0)
LYMPHS PCT: 12 %
MCH: 29.3 pg (ref 26.0–34.0)
MCHC: 32.8 g/dL (ref 30.0–36.0)
MCV: 89.5 fL (ref 78.0–100.0)
MONO ABS: 1.4 10*3/uL — AB (ref 0.1–1.0)
Monocytes Relative: 7 %
Neutro Abs: 15.4 10*3/uL — ABNORMAL HIGH (ref 1.7–7.7)
Neutrophils Relative %: 79 %
Platelets: 364 10*3/uL (ref 150–400)
RBC: 6.1 MIL/uL — ABNORMAL HIGH (ref 4.22–5.81)
RDW: 13.2 % (ref 11.5–15.5)
WBC: 18.9 10*3/uL — AB (ref 4.0–10.5)

## 2018-05-14 LAB — COMPREHENSIVE METABOLIC PANEL
ALBUMIN: 4.4 g/dL (ref 3.5–5.0)
ALT: 25 U/L (ref 0–44)
ANION GAP: 14 (ref 5–15)
AST: 22 U/L (ref 15–41)
Alkaline Phosphatase: 98 U/L (ref 38–126)
BUN: 20 mg/dL (ref 6–20)
CALCIUM: 10 mg/dL (ref 8.9–10.3)
CO2: 24 mmol/L (ref 22–32)
CREATININE: 1.32 mg/dL — AB (ref 0.61–1.24)
Chloride: 102 mmol/L (ref 98–111)
GFR calc Af Amer: >60 mL/min (ref >60)
GFR calc non Af Amer: >60 mL/min (ref >60)
GLUCOSE: 148 mg/dL — AB (ref 70–99)
Potassium: 3.4 mmol/L — ABNORMAL LOW (ref 3.5–5.1)
SODIUM: 140 mmol/L (ref 135–145)
TOTAL PROTEIN: 8.2 g/dL — AB (ref 6.5–8.1)
Total Bilirubin: 0.7 mg/dL (ref 0.3–1.2)

## 2018-05-14 LAB — LIPASE, BLOOD: Lipase: 29 U/L (ref 11–51)

## 2018-05-14 LAB — I-STAT CG4 LACTIC ACID, ED
Lactic Acid, Venous: 5.49 mmol/L (ref 0.5–1.9)
Lactic Acid, Venous: 2 mmol/L (ref 0.5–1.9)

## 2018-05-14 LAB — I-STAT TROPONIN, ED
TROPONIN I, POC: 0.02 ng/mL (ref 0.00–0.08)
Troponin i, poc: 0.01 ng/mL (ref 0.00–0.08)

## 2018-05-14 LAB — BRAIN NATRIURETIC PEPTIDE: B Natriuretic Peptide: 135.5 pg/mL — ABNORMAL HIGH (ref 0.0–100.0)

## 2018-05-14 MED ORDER — SODIUM CHLORIDE 0.9 % IV BOLUS (SEPSIS)
1000.0000 mL | Freq: Once | INTRAVENOUS | Status: AC
  Administered 2018-05-15: 1000 mL via INTRAVENOUS

## 2018-05-14 MED ORDER — IOPAMIDOL (ISOVUE-300) INJECTION 61%
INTRAVENOUS | Status: AC
  Administered 2018-05-14: 100 mL
  Filled 2018-05-14: qty 100

## 2018-05-14 MED ORDER — ONDANSETRON HCL 4 MG/2ML IJ SOLN
4.0000 mg | Freq: Once | INTRAMUSCULAR | Status: AC
  Administered 2018-05-14: 4 mg via INTRAVENOUS
  Filled 2018-05-14: qty 2

## 2018-05-14 MED ORDER — SODIUM CHLORIDE 0.9 % IV SOLN
1.0000 g | Freq: Three times a day (TID) | INTRAVENOUS | Status: DC
  Administered 2018-05-15 – 2018-05-17 (×7): 1 g via INTRAVENOUS
  Filled 2018-05-14 (×9): qty 1

## 2018-05-14 MED ORDER — IOPAMIDOL (ISOVUE-300) INJECTION 61%
100.0000 mL | Freq: Once | INTRAVENOUS | Status: AC | PRN
  Administered 2018-05-15: 100 mL via INTRAVENOUS

## 2018-05-14 MED ORDER — MORPHINE SULFATE (PF) 4 MG/ML IV SOLN
4.0000 mg | Freq: Once | INTRAVENOUS | Status: AC
  Administered 2018-05-14: 4 mg via INTRAVENOUS
  Filled 2018-05-14: qty 1

## 2018-05-14 MED ORDER — MORPHINE SULFATE (PF) 4 MG/ML IV SOLN
4.0000 mg | Freq: Once | INTRAVENOUS | Status: AC
  Administered 2018-05-15: 4 mg via INTRAVENOUS
  Filled 2018-05-14: qty 1

## 2018-05-14 MED ORDER — SODIUM CHLORIDE 0.9 % IV BOLUS (SEPSIS)
1000.0000 mL | Freq: Once | INTRAVENOUS | Status: AC
  Administered 2018-05-14: 1000 mL via INTRAVENOUS

## 2018-05-14 MED ORDER — SODIUM CHLORIDE 0.9 % IV BOLUS
1000.0000 mL | Freq: Once | INTRAVENOUS | Status: AC
  Administered 2018-05-15: 1000 mL via INTRAVENOUS

## 2018-05-14 MED ORDER — VANCOMYCIN HCL IN DEXTROSE 1-5 GM/200ML-% IV SOLN: 1000.0000 mg | Freq: Once | INTRAVENOUS | Status: DC

## 2018-05-14 MED ORDER — ONDANSETRON 4 MG PO TBDP: 4.0000 mg | ORAL_TABLET | Freq: Once | ORAL | Status: DC

## 2018-05-14 MED ORDER — SODIUM CHLORIDE 0.9 % IV BOLUS (SEPSIS)
500.0000 mL | Freq: Once | INTRAVENOUS | Status: AC
  Administered 2018-05-14: 500 mL via INTRAVENOUS

## 2018-05-14 MED ORDER — VANCOMYCIN HCL IN DEXTROSE 750-5 MG/150ML-% IV SOLN: 750.0000 mg | Freq: Two times a day (BID) | INTRAVENOUS | Status: DC

## 2018-05-14 MED ORDER — METRONIDAZOLE IN NACL 5-0.79 MG/ML-% IV SOLN
500.0000 mg | Freq: Three times a day (TID) | INTRAVENOUS | Status: DC
  Administered 2018-05-15 – 2018-05-17 (×8): 500 mg via INTRAVENOUS
  Filled 2018-05-14 (×8): qty 100

## 2018-05-14 MED ORDER — VANCOMYCIN HCL 10 G IV SOLR
2000.0000 mg | Freq: Once | INTRAVENOUS | Status: AC
  Administered 2018-05-14: 2000 mg via INTRAVENOUS
  Filled 2018-05-14: qty 2000

## 2018-05-14 MED ORDER — SODIUM CHLORIDE 0.9 % IV SOLN
2.0000 g | Freq: Once | INTRAVENOUS | Status: AC
  Administered 2018-05-14: 2 g via INTRAVENOUS
  Filled 2018-05-14: qty 2

## 2018-05-14 NOTE — ED Notes (Signed)
This RN attempted multiple IV sticks

## 2018-05-14 NOTE — Progress Notes (Signed)
Pharmacy Antibiotic Note  Steven Holland is a 47 y.o. male admitted on 05/14/2018 with tachycardia.  Pharmacy has been consulted for vancomycin and cefepime dosing. Pt if afebrile but WBC is elevated at 18.9. SCr is mildly elevated at 1.32. Lactic is elevated at 5.49.   Plan: Vancomycin 2gm IV x 1 then 750mg  IV Q12H Cefepime 2gm IV x 1 then 1gm IV Q8H F/u renal fxn, C&S, clinical status and trough at SS  Height: 5\' 8"  (172.7 cm) Weight: 226 lb 6.6 oz (102.7 kg) IBW/kg (Calculated) : 68.4  Temp (24hrs), Avg:98 F (36.7 C), Min:98 F (36.7 C), Max:98 F (36.7 C)  Recent Labs  Lab 05/14/18 1928  LATICACIDVEN 5.49*    CrCl cannot be calculated (Patient's most recent lab result is older than the maximum 21 days allowed.).    Allergies  Allergen Reactions  . Sulfa Antibiotics Swelling    Antimicrobials this admission: Vanc 9/9>> Cefepime 9/9>>  Dose adjustments this admission: N/A  Microbiology results: Pending  Thank you for allowing pharmacy to be a part of this patient's care.  Kanylah Muench, Drake Leach 05/14/2018 7:37 PM

## 2018-05-14 NOTE — ED Notes (Signed)
Per pharmacy, with 1 IV run vanc before flagyl

## 2018-05-14 NOTE — ED Triage Notes (Signed)
Staring on Friday started belching, foul odor.  Bouts of vomiting, belching and feeling abdominal pressure and cold sweats.  Denies any diarrhea.  Reports a slightly loose stool this afternoon.

## 2018-05-14 NOTE — ED Provider Notes (Signed)
Assumed care from Dr. Rush Landmark at shift change.  See prior notes for full H&P.  Briefly, 47 y.o. M here with abdominal pain.  Patient initially tachycardic, abdomen distended, nausea/vomiting, etc.  Initial lactate 5.4, improved to 2.0 after some IVF.    Plan:  CT abdomen/pelvis pending.  Has been given IV abx already, blood cultures sent.  Results for orders placed or performed during the hospital encounter of 05/14/18  Comprehensive metabolic panel  Result Value Ref Range   Sodium 140 135 - 145 mmol/L   Potassium 3.4 (L) 3.5 - 5.1 mmol/L   Chloride 102 98 - 111 mmol/L   CO2 24 22 - 32 mmol/L   Glucose, Bld 148 (H) 70 - 99 mg/dL   BUN 20 6 - 20 mg/dL   Creatinine, Ser 1.61 (H) 0.61 - 1.24 mg/dL   Calcium 09.6 8.9 - 04.5 mg/dL   Total Protein 8.2 (H) 6.5 - 8.1 g/dL   Albumin 4.4 3.5 - 5.0 g/dL   AST 22 15 - 41 U/L   ALT 25 0 - 44 U/L   Alkaline Phosphatase 98 38 - 126 U/L   Total Bilirubin 0.7 0.3 - 1.2 mg/dL   GFR calc non Af Amer >60 >60 mL/min   GFR calc Af Amer >60 >60 mL/min   Anion gap 14 5 - 15  CBC WITH DIFFERENTIAL  Result Value Ref Range   WBC 18.9 (H) 4.0 - 10.5 K/uL   RBC 6.10 (H) 4.22 - 5.81 MIL/uL   Hemoglobin 17.9 (H) 13.0 - 17.0 g/dL   HCT 40.9 (H) 81.1 - 91.4 %   MCV 89.5 78.0 - 100.0 fL   MCH 29.3 26.0 - 34.0 pg   MCHC 32.8 30.0 - 36.0 g/dL   RDW 78.2 95.6 - 21.3 %   Platelets 364 150 - 400 K/uL   Neutrophils Relative % 79 %   Neutro Abs 15.4 (H) 1.7 - 7.7 K/uL   Lymphocytes Relative 12 %   Lymphs Abs 2.2 0.7 - 4.0 K/uL   Monocytes Relative 7 %   Monocytes Absolute 1.4 (H) 0.1 - 1.0 K/uL   Eosinophils Relative 1 %   Eosinophils Absolute 0.1 0.0 - 0.7 K/uL   Basophils Relative 0 %   Basophils Absolute 0.0 0.0 - 0.1 K/uL   Immature Granulocytes 1 %   Abs Immature Granulocytes 0.1 0.0 - 0.1 K/uL  Lipase, blood  Result Value Ref Range   Lipase 29 11 - 51 U/L  Brain natriuretic peptide  Result Value Ref Range   B Natriuretic Peptide 135.5 (H) 0.0 -  100.0 pg/mL  I-stat troponin, ED  Result Value Ref Range   Troponin i, poc 0.02 0.00 - 0.08 ng/mL   Comment 3          I-Stat CG4 Lactic Acid, ED  (not at  [facility])  Result Value Ref Range   Lactic Acid, Venous 5.49 (HH) 0.5 - 1.9 mmol/L   Comment NOTIFIED PHYSICIAN   I-stat troponin, ED (not at [facility])  Result Value Ref Range   Troponin i, poc 0.01 0.00 - 0.08 ng/mL   Comment 3          I-Stat CG4 Lactic Acid, ED  (not at  [facility])  Result Value Ref Range   Lactic Acid, Venous 2.00 (HH) 0.5 - 1.9 mmol/L   Comment NOTIFIED PHYSICIAN    Ct Abdomen Pelvis W Contrast  Result Date: 05/15/2018 CLINICAL DATA:  Severe abdominal pain with nausea and vomiting. Lactate  greater than 5. Khalid sepsis. EXAM: CT ABDOMEN AND PELVIS WITH CONTRAST TECHNIQUE: Multidetector CT imaging of the abdomen and pelvis was performed using the standard protocol following bolus administration of intravenous contrast. CONTRAST:  ISOVUE-300 IOPAMIDOL (ISOVUE-300) INJECTION 61% COMPARISON:  None. FINDINGS: Lower chest: Minor lung base atelectasis.  No pleural fluid. Hepatobiliary: Branching air in the left hepatic lobe felt to be portal venous gas rather than pneumobilia. No discrete focal hepatic lesion. Gallbladder physiologically distended, no calcified stone. No biliary dilatation. Pancreas: No ductal dilatation or inflammation. Spleen: Normal in size without focal abnormality. Adrenals/Urinary Tract: Normal adrenal glands. No hydronephrosis or perinephric edema. Homogeneous renal enhancement with symmetric excretion on delayed phase imaging. Urinary bladder is physiologically distended without wall thickening. Stomach/Bowel: Gaseous gastric distention with gastric pneumatosis about the cardia and antrum with mild associated wall thickening. Small bowel dilated and fluid-filled with mesenteric edema in the lower abdomen. General transition from dilated to nondilated small bowel without discrete transition point. Equivocal  pneumatosis involving small bowel in the central abdomen, favored to be intraluminal air, for example image 42 series 3. Fluid/liquid stool in the cecum and ascending colon. No colonic wall thickening or evidence of diverticulitis. Sigmoid colon is tortuous. Appendix not definitively visualized. Vascular/Lymphatic: No mesenteric venous gas. Normal caliber abdominal aorta. Mesenteric vessels are patent. No enlarged abdominal or pelvic lymph nodes. Reproductive: Prostate is unremarkable. Other: Small amount of free fluid in the pericolic gutters and left upper quadrant. Mild mesenteric edema small amount of mesenteric fluid. No free air. Musculoskeletal: There are no acute or suspicious osseous abnormalities. IMPRESSION: 1. Gastric pneumatosis with branching air in the left hepatic lobe suspicious for portal venous gas rather than pneumobilia. 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 2. Fluid-filled dilated proximal small bowel without discrete transition point to suggest obstruction. Small foci of air within upper abdominal small bowel felt to be intraluminal, however additional foci of pneumatosis are also considered. Findings may be reactive ileus or enteritis. Critical Value/emergent results were called by telephone at the time of interpretation on 05/15/2018 at 12:56 am to [facility] in the Emergency Department, who verbally acknowledged these results. Electronically Signed   By: Narda Rutherford M.D.   On: 05/15/2018 00:58   Dg Chest Port 1 View  Result Date: 05/14/2018 CLINICAL DATA:  Shortness of breath.  Nausea and vomiting for 1 day. EXAM: PORTABLE CHEST 1 VIEW COMPARISON:  02/20/2018 FINDINGS: The heart size and mediastinal contours are within normal limits. Decreased lung volumes. Both lungs are clear. The visualized skeletal structures are unremarkable. IMPRESSION: 1. No acute findings. 2. Decreased lung volumes. Electronically Signed   By: Signa Kell M.D.   On: 05/14/2018 19:51    CT with  findings concerning for emphysematous gastritis.  Patient's pain currently 3/10.  HR 108, BP stable on repeat assessment.  Has received abx, getting 3rd liter of fluids right now.  Discussed with Dr. Antionette Char-- will admit.  Spoke with general surgery, Dr. Cherlyn Labella with abx for now, will see in consult and follow along.   Garlon Hatchet, PA-C 05/15/18 0154    Tegeler, Canary Brim, MD 05/15/18 2242

## 2018-05-14 NOTE — ED Triage Notes (Signed)
Pt has had increased "gas" and vomiting x 3 days. Vomiting resolved yesterday. Today began having increased "belching" and feels distended and short of breath. Went to urgent care and was sent here for tachycardia around 128.  HR 140s in triage. Diaphoretic with chills. No fevers at home. Has had some vasovagal responses at home and "passed out".

## 2018-05-14 NOTE — ED Notes (Signed)
Spoke to dr Delton See about patient.  Patient agreed to go to ed for evaluation of abdominal complaint

## 2018-05-14 NOTE — Progress Notes (Signed)
Arrived to assess for placing vascular access. MD in room using [place] to place access. Completed consult. Tomasita Morrow, RN VAST

## 2018-05-14 NOTE — ED Provider Notes (Signed)
[facility] EMERGENCY DEPARTMENT Provider Note   CSN: [id number] Arrival date & time: 05/14/18  1849     History   Chief Complaint Chief Complaint  Patient presents with  . Tachycardia    HPI DWAN HEMMELGARN is a 47 y.o. male.  The history is provided by the patient, medical records and a significant other. No language interpreter was used.  Abdominal Pain   This is a new problem. The current episode started more than 2 days ago. The problem occurs constantly. The problem has been rapidly worsening. The pain is associated with an unknown factor. The pain is located in the generalized abdominal region. The quality of the pain is aching, pressure-like and cramping. The pain is at a severity of 10/10. The pain is severe. Associated symptoms include fever, nausea and vomiting. Pertinent negatives include constipation, dysuria, frequency and headaches. The symptoms are aggravated by palpation and eating. Nothing relieves the symptoms. His past medical history is significant for irritable bowel syndrome.    Past Medical History:  Diagnosis Date  . Chicken pox   . Hypertension   . Low testosterone   . Syncope    related to IBS and severe nausea    Patient Active Problem List   Diagnosis Date Noted  . Hyperglycemia 05/09/2017  . Hyperlipidemia 11/04/2016  . Obesity, Class I, BMI 30-34.9 10/22/2015  . Hypogonadism male 12/18/2011  . ED (erectile dysfunction) 12/18/2011  . HTN (hypertension) 12/18/2011  . Migraine headache with aura 12/18/2011  . AR (allergic rhinitis) 12/18/2011  . GERD (gastroesophageal reflux disease) 12/18/2011  . Irritable bowel syndrome (IBS) 12/18/2011    Past Surgical History:  Procedure Laterality Date  . none          Home Medications    Prior to Admission medications   Medication Sig Start Date End Date Taking? Authorizing Provider  butalbital-acetaminophen-caffeine (FIORICET, ESGIC) [phone] MG tablet Take 1 tablet by  mouth every 6 (six) hours as needed for headache. 03/23/18 03/23/19  Shon Millet R, DO  Butalbital-APAP-Caff-Cod 50-300-40-30 MG CAPS Take 1 capsule by mouth every 6 (six) hours as needed. 03/23/18   Drema Dallas, DO  Erenumab-aooe (AIMOVIG 140 DOSE) 70 MG/ML SOAJ Inject 140 mg into the skin every 30 (thirty) days. 12/21/17   Drema Dallas, DO  folic acid (FOLVITE) 400 MCG tablet Take 400 mcg by mouth daily.    [provider]  hydrochlorothiazide (MICROZIDE) 12.5 MG capsule Take 1 capsule (12.5 mg total) by mouth daily. 10/13/17   Shelva Majestic, MD  Magnesium 250 MG TABS Take 1 tablet by mouth daily.    [provider]  Multiple Vitamin (MULITIVITAMIN WITH MINERALS) TABS Take 1 tablet by mouth daily.    [provider]  naproxen (NAPROSYN) 500 MG tablet Take 1 tablet (500 mg total) by mouth every 12 (twelve) hours as needed. 06/08/17   Drema Dallas, DO  nortriptyline (PAMELOR) 25 MG capsule Take 1 capsule (25 mg total) by mouth at bedtime. 03/23/18   Everlena Cooper, Adam R, DO  omeprazole (PRILOSEC) 20 MG capsule TAKE 1 CAPSULE (20 MG TOTAL) BY MOUTH DAILY. 08/23/17   Wallis Bamberg, PA-C  ondansetron (ZOFRAN) 8 MG tablet TAKE 1 TABLET (8 MG TOTAL) BY MOUTH EVERY 8 (EIGHT) HOURS AS NEEDED. 08/10/17   Shelva Majestic, MD  Potassium Bicarbonate 99 MG CAPS Take 1 capsule by mouth daily.    [provider]  Tadalafil (CIALIS) 2.5 MG TABS TAKE 1 TABLET (2.5  MG TOTAL) BY MOUTH DAILY. 12/15/17   Shelva Majestic, MD  Testosterone 20.25 MG/ACT (1.62%) GEL APPLY 4 PUMPS TOPICALLY DAILY. 02/07/18   Shelva Majestic, MD  vitamin B-12 (CYANOCOBALAMIN) 1000 MCG tablet Take 1,000 mcg by mouth daily.    [provider]    Family History Family History  Problem Relation Age of Onset  . Arthritis Mother        knee replacements and back  . Heart disease Father        30s reportedly- patient no issues  . Benign prostatic hyperplasia Father   . Other Brother         hypogonadism/low T both brothers  . Migraines Brother        bothbrothers  . Prostate cancer Maternal Grandfather   . Diabetes Paternal Grandmother     Social History Social History   Tobacco Use  . Smoking status: Never Smoker  . Smokeless tobacco: Never Used  Substance Use Topics  . Alcohol use: Yes    Comment: rare  . Drug use: No     Allergies   Sulfa antibiotics   Review of Systems Review of Systems  Constitutional: Positive for chills, diaphoresis, fatigue and fever.  HENT: Negative for congestion.   Eyes: Negative for visual disturbance.  Respiratory: Positive for shortness of breath. Negative for cough, chest tightness and wheezing.   Cardiovascular: Negative for chest pain and palpitations.  Gastrointestinal: Positive for abdominal distention, abdominal pain, nausea and vomiting. Negative for constipation.  Genitourinary: Negative for dysuria, flank pain and frequency.  Musculoskeletal: Negative for back pain, neck pain and neck stiffness.  Skin: Negative for rash and wound.  Neurological: Negative for light-headedness and headaches.  Psychiatric/Behavioral: Negative for agitation.  All other systems reviewed and are negative.    Physical Exam Updated Vital Signs BP 115/90 (BP Location: Right Arm)   Pulse (!) 143   Resp 18   Ht 5\' 8"  (1.727 m)   Wt 102.7 kg   SpO2 98%   BMI 34.43 kg/m   Physical Exam  Constitutional: He is oriented to person, place, and time. He appears well-developed and well-nourished. No distress.  HENT:  Head: Normocephalic and atraumatic.  Nose: Nose normal.  Mouth/Throat: Oropharynx is clear and moist. No oropharyngeal exudate.  Eyes: Pupils are equal, round, and reactive to light. Conjunctivae and EOM are normal.  Neck: Normal range of motion.  Cardiovascular: Regular rhythm and intact distal pulses. Tachycardia present.  No murmur heard. Pulmonary/Chest: Tachypnea noted. No respiratory distress. He has no wheezes. He has  no rales. He exhibits no tenderness.  Abdominal: He exhibits distension. Bowel sounds are decreased. There is generalized tenderness. There is no CVA tenderness.    gu exam refused  Musculoskeletal: He exhibits no edema or tenderness.  Neurological: He is alert and oriented to person, place, and time. No sensory deficit.  Skin: Capillary refill takes less than 2 seconds. He is not diaphoretic. No erythema. No pallor.  Nursing note and vitals reviewed.    ED Treatments / Results  Labs (all labs ordered are listed, but only abnormal results are displayed) Labs Reviewed  COMPREHENSIVE METABOLIC PANEL - Abnormal; Notable for the following components:      Result Value   Potassium 3.4 (*)    Glucose, Bld 148 (*)    Creatinine, Ser 1.32 (*)    Total Protein 8.2 (*)    All other components within normal limits  CBC WITH DIFFERENTIAL/PLATELET - Abnormal; Notable for the  following components:   WBC 18.9 (*)    RBC 6.10 (*)    Hemoglobin 17.9 (*)    HCT 54.6 (*)    Neutro Abs 15.4 (*)    Monocytes Absolute 1.4 (*)    All other components within normal limits  I-STAT CG4 LACTIC ACID, ED - Abnormal; Notable for the following components:   Lactic Acid, Venous 5.49 (*)    All other components within normal limits  I-STAT CG4 LACTIC ACID, ED - Abnormal; Notable for the following components:   Lactic Acid, Venous 2.00 (*)    All other components within normal limits  CULTURE, BLOOD (ROUTINE X 2)  CULTURE, BLOOD (ROUTINE X 2)  URINE CULTURE  LIPASE, BLOOD  URINALYSIS, ROUTINE W REFLEX MICROSCOPIC  BRAIN NATRIURETIC PEPTIDE  I-STAT TROPONIN, ED  I-STAT TROPONIN, ED    EKG EKG Interpretation  Date/Time:  Monday May 14 2018 18:54:33 EDT Ventricular Rate:  136 PR Interval:  144 QRS Duration: 84 QT Interval:  308 QTC Calculation: 463 R Axis:   -14 Text Interpretation:  Sinus tachycardia Left ventricular hypertrophy Possible Inferior infarct , age undetermined Anterolateral  infarct , age undetermined Abnormal ECG When compared to prior, faster rate.  No STEMI Confirmed by Theda Belfast ([id number]) on 05/14/2018 7:40:21 PM   Radiology Dg Chest Port 1 View  Result Date: 05/14/2018 CLINICAL DATA:  Shortness of breath.  Nausea and vomiting for 1 day. EXAM: PORTABLE CHEST 1 VIEW COMPARISON:  02/20/2018 FINDINGS: The heart size and mediastinal contours are within normal limits. Decreased lung volumes. Both lungs are clear. The visualized skeletal structures are unremarkable. IMPRESSION: 1. No acute findings. 2. Decreased lung volumes. Electronically Signed   By: Signa Kell M.D.   On: 05/14/2018 19:51    Procedures Procedures (including critical care time)  CRITICAL CARE Performed by: Canary Brim Tegeler Total critical care time: 45 minutes Critical care time was exclusive of separately billable procedures and treating other patients. Critical care was necessary to treat or prevent imminent or life-threatening deterioration. Critical care was time spent personally by me on the following activities: development of treatment plan with patient and/or surrogate as well as nursing, discussions with consultants, evaluation of patient's response to treatment, examination of patient, obtaining history from patient or surrogate, ordering and performing treatments and interventions, ordering and review of laboratory studies, ordering and review of radiographic studies, pulse oximetry and re-evaluation of patient's condition.  EMERGENCY DEPARTMENT  US GUIDANCE EXAM Emergency Ultrasound:  US Guidance for Needle Guidance  INDICATIONS: Difficult vascular access Linear probe used in real-time to visualize location of needle entry through skin.   PERFORMED BY: Myself IMAGES ARCHIVED?: Yes LIMITATIONS: scar tissue VIEWS USED: Transverse INTERPRETATION: Needle visualized within vein, Right arm and Needle gauge 18   Medications Ordered in ED Medications  metroNIDAZOLE (FLAGYL)  IVPB 500 mg (has no administration in time range)  sodium chloride 0.9 % bolus 1,000 mL (1,000 mLs Intravenous New Bag/Given 05/14/18 2059)    And  sodium chloride 0.9 % bolus 1,000 mL (has no administration in time range)    And  sodium chloride 0.9 % bolus 1,000 mL (has no administration in time range)    And  sodium chloride 0.9 % bolus 500 mL (500 mLs Intravenous New Bag/Given 05/14/18 2059)  vancomycin (VANCOCIN) 2,000 mg in sodium chloride 0.9 % 500 mL IVPB (2,000 mg Intravenous New Bag/Given 05/14/18 2138)  ceFEPIme (MAXIPIME) 1 g in sodium chloride 0.9 % 100 mL IVPB (has no administration in  time range)  vancomycin (VANCOCIN) IVPB 750 mg/150 ml premix (has no administration in time range)  morphine 4 MG/ML injection 4 mg (has no administration in time range)  iopamidol (ISOVUE-300) 61 % injection (has no administration in time range)  sodium chloride 0.9 % bolus 1,000 mL (has no administration in time range)  ceFEPIme (MAXIPIME) 2 g in sodium chloride 0.9 % 100 mL IVPB (2 g Intravenous New Bag/Given 05/14/18 2057)  ondansetron (ZOFRAN) injection 4 mg (4 mg Intravenous Given 05/14/18 2104)  morphine 4 MG/ML injection 4 mg (4 mg Intravenous Given 05/14/18 2104)     Initial Impression / Assessment and Plan / ED Course  I have reviewed the triage vital signs and the nursing notes.  Pertinent labs & imaging results that were available during my care of the patient were reviewed by me and considered in my medical decision making (see chart for details).     Phares ABDULLAH RIZZI is a 47 y.o. male with a past medical history significant for hypertension, irritable bowel syndrome and GERD who presents with abdominal pain, nausea, vomiting, abdominal distention, fevers and chills.  Patient reports that for the last 3 days he has had increased abdominal pain nausea and vomiting.  He is reported decreased oral intake.  He reports that he is still passing gas and has had several loose stools but has not had  full diarrhea.  No report of blood in his bowel movement or emesis.  He reports no history of the same pain.  He reports no recent trauma.  He denies any history of gallstones, kidney stones, diverticulitis, or bowel obstruction.  He reports he was having chills and diaphoresis at home with his abdominal pain.  Next  Patient was seen in triage and was made a code sepsis.  Patient was found to be febrile, tachycardic, and tachypnea.  Patient given broad-spectrum antibiotics and fluids.  Initial lactic acid was greater than 5.  On my exam, abdomen was distended and exquisitely tender all over.  No CVA tenderness.  Lungs were clear.  No murmur appreciated.  Patient had no significant lower extremity injury or edema.  Patient diaphoretic and tachycardic in the 140s on my initial evaluation.  After patient received fluids heart rate began to improve.  On my last eval was less than 100.  Blood pressure was not hypotensive on my reassessment.  Patient receiving antibiotics.  Ultrasound-guided IV placed by me due to IV loss and need for imaging.  Next  Due to the concern for intra-abdominal pathology, patient was given broad-spectrum antibiotics and CT was ordered of the abdomen and pelvis.  Anticipate admission for sepsis and severe abdominal pain.  High suspicion for other diverticulitis at this or other inflammatory/infectious process in the abdomen.      Patient's lactic acid improved to 2.0.  White count was discovered.  Creatinine slightly elevated at 1.32.  Chest x-ray shows no pneumonia or free air under the diaphragm.  Care transferred to Sharilyn Sites, PA-C while awaiting CT results.  Anticipate admission.   Final Clinical Impressions(s) / ED Diagnoses   Final diagnoses:  Lactic acidosis  Generalized abdominal pain  Intractable vomiting with nausea, unspecified vomiting type     Clinical Impression: 1. Lactic acidosis   2. Generalized abdominal pain   3. Intractable vomiting with  nausea, unspecified vomiting type     Disposition: Awaiting CT results to determine etiology of severe abdominal pain and sepsis.  This note was prepared with assistance of Dragon voice  recognition software. Occasional wrong-word or sound-a-like substitutions may have occurred due to the inherent limitations of voice recognition software.      Tegeler, Canary Brim, MD 05/15/18 [phone]

## 2018-05-15 ENCOUNTER — Emergency Department (HOSPITAL_COMMUNITY): Payer: BLUE CROSS/BLUE SHIELD

## 2018-05-15 ENCOUNTER — Encounter (HOSPITAL_COMMUNITY): Payer: Self-pay | Admitting: Family Medicine

## 2018-05-15 ENCOUNTER — Other Ambulatory Visit: Payer: Self-pay

## 2018-05-15 DIAGNOSIS — R6520 Severe sepsis without septic shock: Secondary | ICD-10-CM | POA: Diagnosis present

## 2018-05-15 DIAGNOSIS — N179 Acute kidney failure, unspecified: Secondary | ICD-10-CM | POA: Diagnosis present

## 2018-05-15 DIAGNOSIS — E876 Hypokalemia: Secondary | ICD-10-CM

## 2018-05-15 DIAGNOSIS — R9431 Abnormal electrocardiogram [ECG] [EKG]: Secondary | ICD-10-CM | POA: Diagnosis present

## 2018-05-15 DIAGNOSIS — K2960 Other gastritis without bleeding: Secondary | ICD-10-CM | POA: Diagnosis present

## 2018-05-15 DIAGNOSIS — Z8719 Personal history of other diseases of the digestive system: Secondary | ICD-10-CM | POA: Diagnosis not present

## 2018-05-15 DIAGNOSIS — R402140 Coma scale, eyes open, spontaneous, unspecified time: Secondary | ICD-10-CM | POA: Diagnosis present

## 2018-05-15 DIAGNOSIS — K219 Gastro-esophageal reflux disease without esophagitis: Secondary | ICD-10-CM | POA: Diagnosis present

## 2018-05-15 DIAGNOSIS — Z79899 Other long term (current) drug therapy: Secondary | ICD-10-CM | POA: Diagnosis not present

## 2018-05-15 DIAGNOSIS — K3189 Other diseases of stomach and duodenum: Secondary | ICD-10-CM | POA: Diagnosis present

## 2018-05-15 DIAGNOSIS — G43909 Migraine, unspecified, not intractable, without status migrainosus: Secondary | ICD-10-CM | POA: Diagnosis present

## 2018-05-15 DIAGNOSIS — Z882 Allergy status to sulfonamides status: Secondary | ICD-10-CM | POA: Diagnosis not present

## 2018-05-15 DIAGNOSIS — Z6834 Body mass index (BMI) 34.0-34.9, adult: Secondary | ICD-10-CM | POA: Diagnosis not present

## 2018-05-15 DIAGNOSIS — I10 Essential (primary) hypertension: Secondary | ICD-10-CM | POA: Diagnosis present

## 2018-05-15 DIAGNOSIS — R402360 Coma scale, best motor response, obeys commands, unspecified time: Secondary | ICD-10-CM | POA: Diagnosis present

## 2018-05-15 DIAGNOSIS — Z91011 Allergy to milk products: Secondary | ICD-10-CM | POA: Diagnosis not present

## 2018-05-15 DIAGNOSIS — A419 Sepsis, unspecified organism: Secondary | ICD-10-CM | POA: Diagnosis present

## 2018-05-15 DIAGNOSIS — E669 Obesity, unspecified: Secondary | ICD-10-CM | POA: Diagnosis present

## 2018-05-15 DIAGNOSIS — R402250 Coma scale, best verbal response, oriented, unspecified time: Secondary | ICD-10-CM | POA: Diagnosis present

## 2018-05-15 LAB — URINALYSIS, ROUTINE W REFLEX MICROSCOPIC
Bilirubin Urine: NEGATIVE
GLUCOSE, UA: NEGATIVE mg/dL
Hgb urine dipstick: NEGATIVE
Ketones, ur: NEGATIVE mg/dL
LEUKOCYTES UA: NEGATIVE
Nitrite: NEGATIVE
PH: 7 (ref 5.0–8.0)
PROTEIN: NEGATIVE mg/dL
Specific Gravity, Urine: 1.031 — ABNORMAL HIGH (ref 1.005–1.030)

## 2018-05-15 LAB — LACTIC ACID, PLASMA: Lactic Acid, Venous: 1.3 mmol/L (ref 0.5–1.9)

## 2018-05-15 LAB — CBC WITH DIFFERENTIAL/PLATELET
ABS IMMATURE GRANULOCYTES: 0.1 10*3/uL (ref 0.0–0.1)
BASOS PCT: 0 %
Basophils Absolute: 0 10*3/uL (ref 0.0–0.1)
EOS PCT: 1 %
Eosinophils Absolute: 0.1 10*3/uL (ref 0.0–0.7)
HCT: 47.1 % (ref 39.0–52.0)
HEMOGLOBIN: 15.3 g/dL (ref 13.0–17.0)
Immature Granulocytes: 1 %
Lymphocytes Relative: 8 %
Lymphs Abs: 1.3 10*3/uL (ref 0.7–4.0)
MCH: 29.3 pg (ref 26.0–34.0)
MCHC: 32.5 g/dL (ref 30.0–36.0)
MCV: 90.1 fL (ref 78.0–100.0)
MONO ABS: 1.1 10*3/uL — AB (ref 0.1–1.0)
MONOS PCT: 7 %
NEUTROS ABS: 14.1 10*3/uL — AB (ref 1.7–7.7)
Neutrophils Relative %: 83 %
PLATELETS: 262 10*3/uL (ref 150–400)
RBC: 5.23 MIL/uL (ref 4.22–5.81)
RDW: 13.3 % (ref 11.5–15.5)
WBC: 16.7 10*3/uL — ABNORMAL HIGH (ref 4.0–10.5)

## 2018-05-15 LAB — BASIC METABOLIC PANEL
Anion gap: 10 (ref 5–15)
BUN: 13 mg/dL (ref 6–20)
CALCIUM: 7.9 mg/dL — AB (ref 8.9–10.3)
CO2: 21 mmol/L — AB (ref 22–32)
Chloride: 109 mmol/L (ref 98–111)
Creatinine, Ser: 0.83 mg/dL (ref 0.61–1.24)
GFR calc Af Amer: >60 mL/min (ref >60)
GLUCOSE: 164 mg/dL — AB (ref 70–99)
Potassium: 3.4 mmol/L — ABNORMAL LOW (ref 3.5–5.1)
Sodium: 140 mmol/L (ref 135–145)

## 2018-05-15 LAB — HIV ANTIBODY (ROUTINE TESTING W REFLEX): HIV Screen 4th Generation wRfx: NONREACTIVE

## 2018-05-15 LAB — MRSA PCR SCREENING: MRSA BY PCR: NEGATIVE

## 2018-05-15 MED ORDER — VANCOMYCIN HCL IN DEXTROSE 750-5 MG/150ML-% IV SOLN
750.0000 mg | Freq: Two times a day (BID) | INTRAVENOUS | Status: DC
  Administered 2018-05-15 (×2): 750 mg via INTRAVENOUS
  Filled 2018-05-15 (×3): qty 150

## 2018-05-15 MED ORDER — PANTOPRAZOLE SODIUM 40 MG IV SOLR
40.0000 mg | Freq: Two times a day (BID) | INTRAVENOUS | Status: DC
  Administered 2018-05-15 – 2018-05-17 (×6): 40 mg via INTRAVENOUS
  Filled 2018-05-15 (×6): qty 40

## 2018-05-15 MED ORDER — ONDANSETRON HCL 4 MG/2ML IJ SOLN
4.0000 mg | Freq: Four times a day (QID) | INTRAMUSCULAR | Status: DC | PRN
  Administered 2018-05-15: 4 mg via INTRAVENOUS
  Filled 2018-05-15: qty 2

## 2018-05-15 MED ORDER — POTASSIUM CHLORIDE IN NACL 20-0.9 MEQ/L-% IV SOLN
INTRAVENOUS | Status: AC
  Administered 2018-05-15: 04:00:00 via INTRAVENOUS
  Filled 2018-05-15 (×2): qty 1000

## 2018-05-15 MED ORDER — SODIUM CHLORIDE 0.9% FLUSH
3.0000 mL | Freq: Two times a day (BID) | INTRAVENOUS | Status: DC
  Administered 2018-05-15 – 2018-05-17 (×4): 3 mL via INTRAVENOUS

## 2018-05-15 MED ORDER — ACETAMINOPHEN 325 MG PO TABS
650.0000 mg | ORAL_TABLET | Freq: Four times a day (QID) | ORAL | Status: DC | PRN
  Administered 2018-05-16: 650 mg via ORAL
  Filled 2018-05-15: qty 2

## 2018-05-15 MED ORDER — ACETAMINOPHEN 650 MG RE SUPP: 650.0000 mg | Freq: Four times a day (QID) | RECTAL | Status: DC | PRN

## 2018-05-15 MED ORDER — FENTANYL CITRATE (PF) 100 MCG/2ML IJ SOLN
25.0000 ug | INTRAMUSCULAR | Status: DC | PRN
  Administered 2018-05-15 (×2): 50 ug via INTRAVENOUS
  Administered 2018-05-15: 25 ug via INTRAVENOUS
  Administered 2018-05-15: 50 ug via INTRAVENOUS
  Administered 2018-05-15: 25 ug via INTRAVENOUS
  Filled 2018-05-15 (×5): qty 2

## 2018-05-15 MED ORDER — POTASSIUM CHLORIDE IN NACL 20-0.9 MEQ/L-% IV SOLN
INTRAVENOUS | Status: AC
  Administered 2018-05-15 – 2018-05-16 (×2): via INTRAVENOUS
  Filled 2018-05-15 (×3): qty 1000

## 2018-05-15 MED ORDER — FAMOTIDINE IN NACL 20-0.9 MG/50ML-% IV SOLN
20.0000 mg | Freq: Two times a day (BID) | INTRAVENOUS | Status: DC
  Administered 2018-05-15 – 2018-05-17 (×6): 20 mg via INTRAVENOUS
  Filled 2018-05-15 (×6): qty 50

## 2018-05-15 MED ORDER — ONDANSETRON HCL 4 MG PO TABS: 4.0000 mg | ORAL_TABLET | Freq: Four times a day (QID) | ORAL | Status: DC | PRN

## 2018-05-15 NOTE — Progress Notes (Addendum)
    CC: Increased "gas," and vomiting x3 days, fever, syncope  Subjective: Feels a little better, has Ice pack on because he is chronically hot.  Abdominal pain was like a balloon that was blown up and pain all over.  It seems better now and he cannot identify any one area that is tender.  Having some diarrhea this AM x 2.    Objective: Vital signs in last 24 hours: Temp:  [98 F (36.7 C)-98.2 F (36.8 C)] 98.2 F (36.8 C) (09/10 0254) Pulse Rate:  [97-143] 111 (09/10 0830) Resp:  [11-27] 19 (09/10 0830) BP: (93-159)/(56-136) 140/93 (09/10 0830) SpO2:  [87 %-100 %] 93 % (09/10 0830) Weight:  [102.7 kg] 102.7 kg (09/09 1907)    4204 IV 700 urine Afebrile, hypertensive, mild tachycardia Potassium 3.4, glucose 164, lactate is down to 1.3 WBC improving 16.7 hemoglobin 15.3, hematocrit 47.1. CT 05/15/2018: Gastric pneumatosis with branching air in the left hepatic lobe suspicious for portal venous gas rather than pneumobilia finding is suspicious for emphysematous gastritis.  Fluid-filled proximal distal small bowel without transition findings may be reactive ileus or enteritis.   Intake/Output from previous day: 09/09 0701 - 09/10 0700 In: 4204.7 [IV Piggyback:4204.7] Out: 700 [Urine:700] Intake/Output this shift: Total I/O In: 98.9 [IV Piggyback:98.9] Out: -   General appearance: alert, cooperative and no distress Resp: clear to auscultation bilaterally GI: soft, few BS, not really very tender right now.  Obese, but does not seem distended.   Lab Results:  Recent Labs    05/14/18 2023 05/15/18 0421  WBC 18.9* 16.7*  HGB 17.9* 15.3  HCT 54.6* 47.1  PLT 364 262    BMET Recent Labs    05/14/18 2023 05/15/18 0421  NA 140 140  K 3.4* 3.4*  CL 102 109  CO2 24 21*  GLUCOSE 148* 164*  BUN 20 13  CREATININE 1.32* 0.83  CALCIUM 10.0 7.9*   PT/INR No results for input(s): LABPROT, INR in the last 72 hours.  Recent Labs  Lab 05/14/18 2023  AST 22  ALT 25   ALKPHOS 98  BILITOT 0.7  PROT 8.2*  ALBUMIN 4.4     Lipase     Component Value Date/Time   LIPASE 29 05/14/2018 2023     Medications: . pantoprazole  40 mg Intravenous Q12H  . sodium chloride flush  3 mL Intravenous Q12H   . 0.9 % NaCl with KCl 20 mEq / L 115 mL/hr at 05/15/18 0334  . ceFEPime (MAXIPIME) IV Stopped (05/15/18 0725)  . famotidine (PEPCID) IV Stopped (05/15/18 0322)  . metronidazole Stopped (05/15/18 0207)  . vancomycin      Assessment/Plan  Emphysematous gastritis Acute kidney injury Hypertension History IBS Hx Migraines BMI 34.4   FEN: IV fluids/n.p.o. ID: Cefepime, Flagyl, vancomycin 9/9=>> day 2 DVT:  SCD's Follow up:  TBD    Plan:  Agree with ongoing antibiotics, lactate, WBC and symptoms improving.     LOS: 0 days    Rikayla Demmon 05/15/2018 [phone]

## 2018-05-15 NOTE — Consult Note (Addendum)
Reason for Consult:abd pain Referring Physician: Dr Garret Reddish is an 47 y.o. male.  HPI: 40 yom with htn, obesity presents with abd pain since Friday. He noted abd pain that has waxed and waned since then. Associated with n/v. He is passing flatus and stool.  Pain was worse and nothing making it better leading him to go to urgent care today. Has chills.  Symptoms worse with eating and touching it.  He was noted at urgent care to be tachycardic and sent to er. Elevated lactate initially which  Is now better. Ct with emphysematous gastritis and I was asked to see him.   Past Medical History:  Diagnosis Date  . Chicken pox   . Hypertension   . Low testosterone   . Syncope    related to IBS and severe nausea    Past Surgical History:  Procedure Laterality Date  . none      Family History  Problem Relation Age of Onset  . Arthritis Mother        knee replacements and back  . Heart disease Father        14s reportedly- patient no issues  . Benign prostatic hyperplasia Father   . Other Brother        hypogonadism/low T both brothers  . Migraines Brother        bothbrothers  . Prostate cancer Maternal Grandfather   . Diabetes Paternal Grandmother     Social History:  reports that he has never smoked. He has never used smokeless tobacco. He reports that he drinks alcohol. He reports that he does not use drugs.  Allergies:  Allergies  Allergen Reactions  . Sulfa Antibiotics Swelling    Eyes became swollen and ear became swollen (localized)  . Milk-Related Compounds Other (See Comments)    Intense abdominal pain and passing out if he consumes over 4 ounces    Medications: I have reviewed the patient's current medications.  Results for orders placed or performed during the hospital encounter of 05/14/18 (from the past 48 hour(s))  I-stat troponin, ED     Status: None   Collection Time: 05/14/18  7:20 PM  Result Value Ref Range   Troponin i, poc 0.02 0.00 - 0.08  ng/mL   Comment 3            Comment: Due to the release kinetics of cTnI, a negative result within the first hours of the onset of symptoms does not rule out myocardial infarction with certainty. If myocardial infarction is still suspected, repeat the test at appropriate intervals.   I-Stat CG4 Lactic Acid, ED  (not at  [facility])     Status: Abnormal   Collection Time: 05/14/18  7:28 PM  Result Value Ref Range   Lactic Acid, Venous 5.49 (HH) 0.5 - 1.9 mmol/L   Comment NOTIFIED PHYSICIAN   Comprehensive metabolic panel     Status: Abnormal   Collection Time: 05/14/18  8:23 PM  Result Value Ref Range   Sodium 140 135 - 145 mmol/L   Potassium 3.4 (L) 3.5 - 5.1 mmol/L   Chloride 102 98 - 111 mmol/L   CO2 24 22 - 32 mmol/L   Glucose, Bld 148 (H) 70 - 99 mg/dL   BUN 20 6 - 20 mg/dL   Creatinine, Ser 1.32 (H) 0.61 - 1.24 mg/dL   Calcium 10.0 8.9 - 10.3 mg/dL   Total Protein 8.2 (H) 6.5 - 8.1 g/dL   Albumin 4.4 3.5 -  5.0 g/dL   AST 22 15 - 41 U/L   ALT 25 0 - 44 U/L   Alkaline Phosphatase 98 38 - 126 U/L   Total Bilirubin 0.7 0.3 - 1.2 mg/dL   GFR calc non Af Amer >60 >60 mL/min   GFR calc Af Amer >60 >60 mL/min    Comment: (NOTE) The eGFR has been calculated using the CKD EPI equation. This calculation has not been validated in all clinical situations. eGFR's persistently <60 mL/min signify possible Chronic Kidney Disease.    Anion gap 14 5 - 15    Comment: Performed at [facility] [address] [place], [place] [address]  CBC WITH DIFFERENTIAL     Status: Abnormal   Collection Time: 05/14/18  8:23 PM  Result Value Ref Range   WBC 18.9 (H) 4.0 - 10.5 K/uL   RBC 6.10 (H) 4.22 - 5.81 MIL/uL   Hemoglobin 17.9 (H) 13.0 - 17.0 g/dL   HCT 54.6 (H) 39.0 - 52.0 %   MCV 89.5 78.0 - 100.0 fL   MCH 29.3 26.0 - 34.0 pg   MCHC 32.8 30.0 - 36.0 g/dL   RDW 13.2 11.5 - 15.5 %   Platelets 364 150 - 400 K/uL   Neutrophils Relative % 79 %   Neutro Abs 15.4 (H) 1.7 - 7.7 K/uL    Lymphocytes Relative 12 %   Lymphs Abs 2.2 0.7 - 4.0 K/uL   Monocytes Relative 7 %   Monocytes Absolute 1.4 (H) 0.1 - 1.0 K/uL   Eosinophils Relative 1 %   Eosinophils Absolute 0.1 0.0 - 0.7 K/uL   Basophils Relative 0 %   Basophils Absolute 0.0 0.0 - 0.1 K/uL   Immature Granulocytes 1 %   Abs Immature Granulocytes 0.1 0.0 - 0.1 K/uL    Comment: Performed at [facility], [address] [place], [place] [address]  Lipase, blood     Status: None   Collection Time: 05/14/18  8:23 PM  Result Value Ref Range   Lipase 29 11 - 51 U/L    Comment: Performed at [facility] [address], [place], [place] [address]  Brain natriuretic peptide     Status: Abnormal   Collection Time: 05/14/18  8:23 PM  Result Value Ref Range   B Natriuretic Peptide 135.5 (H) 0.0 - 100.0 pg/mL    Comment: Performed at [facility] [address], [place], [place] [address]  I-stat troponin, ED (not at [facility])     Status: None   Collection Time: 05/14/18  9:44 PM  Result Value Ref Range   Troponin i, poc 0.01 0.00 - 0.08 ng/mL   Comment 3            Comment: Due to the release kinetics of cTnI, a negative result within the first hours of the onset of symptoms does not rule out myocardial infarction with certainty. If myocardial infarction is still suspected, repeat the test at appropriate intervals.   I-Stat CG4 Lactic Acid, ED  (not at  [facility])     Status: Abnormal   Collection Time: 05/14/18  9:47 PM  Result Value Ref Range   Lactic Acid, Venous 2.00 (HH) 0.5 - 1.9 mmol/L   Comment NOTIFIED PHYSICIAN     Ct Abdomen Pelvis W Contrast  Result Date: 05/15/2018 CLINICAL DATA:  Severe abdominal pain with nausea and vomiting. Lactate greater than 5. Khalid sepsis. EXAM: CT ABDOMEN AND PELVIS WITH CONTRAST TECHNIQUE:  Multidetector CT imaging of the abdomen and pelvis was performed using the standard protocol following bolus administration of intravenous contrast. CONTRAST:  160m  ISOVUE-300 IOPAMIDOL (ISOVUE-300) INJECTION 61% COMPARISON:  None. FINDINGS: Lower chest: Minor lung base atelectasis.  No pleural fluid. Hepatobiliary: Branching air in the left hepatic lobe felt to be portal venous gas rather than pneumobilia. No discrete focal hepatic lesion. Gallbladder physiologically distended, no calcified stone. No biliary dilatation. Pancreas: No ductal dilatation or inflammation. Spleen: Normal in size without focal abnormality. Adrenals/Urinary Tract: Normal adrenal glands. No hydronephrosis or perinephric edema. Homogeneous renal enhancement with symmetric excretion on delayed phase imaging. Urinary bladder is physiologically distended without wall thickening. Stomach/Bowel: Gaseous gastric distention with gastric pneumatosis about the cardia and antrum with mild associated wall thickening. Small bowel dilated and fluid-filled with mesenteric edema in the lower abdomen. General transition from dilated to nondilated small bowel without discrete transition point. Equivocal pneumatosis involving small bowel in the central abdomen, favored to be intraluminal air, for example image 42 series 3. Fluid/liquid stool in the cecum and ascending colon. No colonic wall thickening or evidence of diverticulitis. Sigmoid colon is tortuous. Appendix not definitively visualized. Vascular/Lymphatic: No mesenteric venous gas. Normal caliber abdominal aorta. Mesenteric vessels are patent. No enlarged abdominal or pelvic lymph nodes. Reproductive: Prostate is unremarkable. Other: Small amount of free fluid in the pericolic gutters and left upper quadrant. Mild mesenteric edema small amount of mesenteric fluid. No free air. Musculoskeletal: There are no acute or suspicious osseous abnormalities. IMPRESSION: 1. Gastric pneumatosis with branching air in the left hepatic lobe suspicious for portal venous gas rather than pneumobilia. 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 2. Fluid-filled dilated  proximal small bowel without discrete transition point to suggest obstruction. Small foci of air within upper abdominal small bowel felt to be intraluminal, however additional foci of pneumatosis are also considered. Findings may be reactive ileus or enteritis. Critical Value/emergent results were called by telephone at the time of interpretation on 05/15/2018 at 12:56 am to [facility], who verbally acknowledged these results. Electronically Signed   By: MKeith RakeM.D.   On: 05/15/2018 00:58   Dg Chest Port 1 View  Result Date: 05/14/2018 CLINICAL DATA:  Shortness of breath.  Nausea and vomiting for 1 day. EXAM: PORTABLE CHEST 1 VIEW COMPARISON:  02/20/2018 FINDINGS: The heart size and mediastinal contours are within normal limits. Decreased lung volumes. Both lungs are clear. The visualized skeletal structures are unremarkable. IMPRESSION: 1. No acute findings. 2. Decreased lung volumes. Electronically Signed   By: TKerby MoorsM.D.   On: 05/14/2018 19:51    Review of Systems  Gastrointestinal: Positive for abdominal pain, nausea and vomiting.  All other systems reviewed and are negative.  Blood pressure (!) 149/96, pulse (!) 110, resp. rate 16, height '5\' 8"'  (1.727 m), weight 102.7 kg, SpO2 100 %. Physical Exam  Constitutional: He is oriented to person, place, and time. He appears well-developed and well-nourished.  HENT:  Head: Normocephalic and atraumatic.  Eyes: No scleral icterus.  Neck: Neck supple.  Cardiovascular: Regular rhythm. Tachycardia present.  Respiratory: Effort normal and breath sounds normal.  GI: He exhibits distension (mild). Bowel sounds are decreased. There is tenderness in the right upper quadrant and epigastric area. No hernia.  Neurological: He is alert and oriented to person, place, and time.  Skin: Skin is warm and dry. He is not diaphoretic.  Psychiatric: His behavior is normal. Thought content normal.     Assessment/Plan: Emphysematous  gastritis/portal venous air  I think this is likely infectious in nature.   I dont think right now surgery is indicated although that may certainly change. He is better with volume and antibiotics. Recommend iv fluids and broad coverage esp with anaerobes and gram negatives as being done right now. Hopefully will resolve conservatively. Will follow with you.  I added pepcid to medications as well.   Rolm Bookbinder 05/15/2018, 2:14 AM

## 2018-05-15 NOTE — ED Notes (Signed)
Pt ambulated to restroom. 

## 2018-05-15 NOTE — Progress Notes (Signed)
Patient is seen and examined. Please see today's H&P for the details. 47 y/o male is admitted with emphysematous gastritis. On conservative treatment by surgery. Patient reports feeling better. Lactate is trending down to normal. Blood cultures: NGTD. Cont iv fluids, iv antibiotics, pain control, antiemetics as needed. Cont close monitor  Steven Holland

## 2018-05-15 NOTE — H&P (Signed)
History and Physical    Steven Holland AOZ:[id number] DOB: 1971/03/01 DOA: 05/14/2018  PCP: Shelva Majestic, MD   Patient coming from: Home   Chief Complaint: Abdominal pain, nausea, vomiting, sweats   HPI: Steven Holland is a 47 y.o. male with medical history significant for hypertension, IBS, and headaches, now presenting to the emergency department for evaluation of abdominal pain, diaphoresis, vomiting, and belching.  Patient reports that he been in his usual state of health until 05/11/2018 when he developed generalized abdominal discomfort with mild distention, nausea, and nonbloody vomiting.  Symptoms were much worse the following day, but then seemed to improve some with resolution in the vomiting, but he continued to have frequent belching with reported foul odor.  He also continued to have abdominal discomfort, now more localized to the upper abdomen, as well as intermittent diaphoresis.  Denies fevers per se, but reports intermittent chills and sweats.  Denies diarrhea, melena, or hematochezia.  He had not experienced these symptoms previously.  ED Course: Upon arrival to the ED, patient is found to be afebrile, saturating well on room air, tachycardic in the 140s, and with stable blood pressure.  EKG features sinus tachycardia with rate 136 and LVH.  Chest x-ray is negative for acute cardiopulmonary disease.  Chemistry panel is notable for potassium 3.4 and creatinine 1.32, up from 0.85 last year.  CBC is notable for leukocytosis to 18,900 and lactic acid is elevated to 5.49.  CT of the abdomen and pelvis is concerning for gastric pneumatosis with air in the biliary tree and fluid-filled dilated proximal small bowel without evidence for obstruction.  Blood cultures were collected and the patient was given 30 cc/kg NS bolus, morphine, and he was started on broad-spectrum empiric antibiotics.  General surgery was consulted by the ED physician.  Patient remains hemodynamically stable  with improvement in his tachycardia, and he will be admitted for ongoing evaluation and management.  Review of Systems:  All other systems reviewed and apart from HPI, are negative.  Past Medical History:  Diagnosis Date  . Chicken pox   . Hypertension   . Low testosterone   . Syncope    related to IBS and severe nausea    Past Surgical History:  Procedure Laterality Date  . none       reports that he has never smoked. He has never used smokeless tobacco. He reports that he drinks alcohol. He reports that he does not use drugs.  Allergies  Allergen Reactions  . Sulfa Antibiotics Swelling    Eyes became swollen and ear became swollen (localized)  . Milk-Related Compounds Other (See Comments)    Intense abdominal pain and passing out if he consumes over 4 ounces    Family History  Problem Relation Age of Onset  . Arthritis Mother        knee replacements and back  . Heart disease Father        30s reportedly- patient no issues  . Benign prostatic hyperplasia Father   . Other Brother        hypogonadism/low T both brothers  . Migraines Brother        bothbrothers  . Prostate cancer Maternal Grandfather   . Diabetes Paternal Grandmother      Prior to Admission medications   Medication Sig Start Date End Date Taking? Authorizing Provider  butalbital-acetaminophen-caffeine (FIORICET WITH CODEINE) 50-325-40-30 MG capsule Take 1 capsule by mouth every 6 (six) hours as needed (for migraines).  04/27/18  Yes [provider]  Erenumab-aooe (AIMOVIG 140 DOSE) 70 MG/ML SOAJ Inject 140 mg into the skin every 30 (thirty) days. 12/21/17  Yes Everlena Cooper, Adam R, DO  folic acid (FOLVITE) 400 MCG tablet Take 400 mcg by mouth daily.   Yes [provider]  hydrochlorothiazide (MICROZIDE) 12.5 MG capsule Take 1 capsule (12.5 mg total) by mouth daily. Patient taking differently: Take 12.5 mg by mouth at bedtime.  10/13/17  Yes Shelva Majestic, MD  Ketorolac Tromethamine  (SPRIX) 15.75 MG/SPRAY SOLN Place 1 spray into both nostrils once as needed (for migraines).    Yes [provider]  Magnesium 250 MG TABS Take 250 mg by mouth daily.    Yes [provider]  Multiple Vitamin (MULITIVITAMIN WITH MINERALS) TABS Take 1 tablet by mouth daily.   Yes [provider]  naproxen sodium (ALEVE) 220 MG tablet Take 220-440 mg by mouth 2 (two) times daily as needed (for pain).   Yes [provider]  nortriptyline (PAMELOR) 25 MG capsule Take 1 capsule (25 mg total) by mouth at bedtime. 03/23/18  Yes Jaffe, Adam R, DO  omeprazole (PRILOSEC) 20 MG capsule TAKE 1 CAPSULE (20 MG TOTAL) BY MOUTH DAILY. Patient taking differently: Take 20 mg by mouth at bedtime.  08/23/17  Yes Wallis Bamberg, PA-C  ondansetron (ZOFRAN) 8 MG tablet TAKE 1 TABLET (8 MG TOTAL) BY MOUTH EVERY 8 (EIGHT) HOURS AS NEEDED. Patient taking differently: Take 8 mg by mouth every 8 (eight) hours as needed for nausea or vomiting.  08/10/17  Yes Shelva Majestic, MD  Potassium Bicarbonate 99 MG CAPS Take 1 capsule by mouth daily.   Yes [provider]  Tadalafil (CIALIS) 2.5 MG TABS TAKE 1 TABLET (2.5 MG TOTAL) BY MOUTH DAILY. 12/15/17  Yes Shelva Majestic, MD  Testosterone 20.25 MG/ACT (1.62%) GEL APPLY 4 PUMPS TOPICALLY DAILY. 02/07/18  Yes Shelva Majestic, MD  vitamin B-12 (CYANOCOBALAMIN) 1000 MCG tablet Take 1,000 mcg by mouth daily.   Yes [provider]    Physical Exam: Vitals:   05/15/18 0015 05/15/18 0030 05/15/18 0045 05/15/18 0100  BP:  (!) 154/104 (!) 147/95 (!) 149/96  Pulse: (!) 106 (!) 108 (!) 109 (!) 110  Resp: 19 16 (!) 23 16  SpO2: 100% 96% 97% 100%  Weight:      Height:          Constitutional: NAD, appears uncomfortable Eyes: PERTLA, lids and conjunctivae normal ENMT: Mucous membranes are moist. Posterior pharynx clear of any exudate or lesions.   Neck: normal, supple, no masses, no thyromegaly Respiratory: clear to auscultation  bilaterally, no wheezing, no crackles. Normal respiratory effort.    Cardiovascular: Rate ~110 and regular. No extremity edema.   Abdomen: Soft, tender in upper abdomen with voluntary guarding. Bowel sounds active.  Musculoskeletal: no clubbing / cyanosis. No joint deformity upper and lower extremities.    Skin: no significant rashes, lesions, ulcers. Warm, dry, well-perfused. Neurologic: CN 2-12 grossly intact. Sensation intact. Strength 5/5 in all 4 limbs.  Psychiatric: Alert and oriented x 3. Pleasant and cooperative.     Labs on Admission: I have personally reviewed following labs and imaging studies  CBC: Recent Labs  Lab 05/14/18 2023  WBC 18.9*  NEUTROABS 15.4*  HGB 17.9*  HCT 54.6*  MCV 89.5  PLT 364   Basic Metabolic Panel: Recent Labs  Lab 05/14/18 2023  NA 140  K 3.4*  CL 102  CO2 24  GLUCOSE 148*  BUN 20  CREATININE 1.32*  CALCIUM 10.0   GFR: Estimated Creatinine Clearance: 79.5 mL/min (A) (by C-G formula based on SCr of 1.32 mg/dL (H)). Liver Function Tests: Recent Labs  Lab 05/14/18 2023  AST 22  ALT 25  ALKPHOS 98  BILITOT 0.7  PROT 8.2*  ALBUMIN 4.4   Recent Labs  Lab 05/14/18 2023  LIPASE 29   No results for input(s): AMMONIA in the last 168 hours. Coagulation Profile: No results for input(s): INR, PROTIME in the last 168 hours. Cardiac Enzymes: No results for input(s): CKTOTAL, CKMB, CKMBINDEX, TROPONINI in the last 168 hours. BNP (last 3 results) No results for input(s): PROBNP in the last 8760 hours. HbA1C: No results for input(s): HGBA1C in the last 72 hours. CBG: No results for input(s): GLUCAP in the last 168 hours. Lipid Profile: No results for input(s): CHOL, HDL, LDLCALC, TRIG, CHOLHDL, LDLDIRECT in the last 72 hours. Thyroid Function Tests: No results for input(s): TSH, T4TOTAL, FREET4, T3FREE, THYROIDAB in the last 72 hours. Anemia Panel: No results for input(s): VITAMINB12, FOLATE, FERRITIN, TIBC, IRON, RETICCTPCT in  the last 72 hours. Urine analysis:    Component Value Date/Time   BILIRUBINUR neg 02/03/2012 1455   PROTEINUR neg 02/03/2012 1455   UROBILINOGEN 0.2 02/03/2012 1455   NITRITE neg 02/03/2012 1455   LEUKOCYTESUR Trace 02/03/2012 1455   Sepsis Labs: @LABRCNTIP (procalcitonin:4,lacticidven:4) )No results found for this or any previous visit (from the past 240 hour(s)).   Radiological Exams on Admission: Ct Abdomen Pelvis W Contrast  Result Date: 05/15/2018 CLINICAL DATA:  Severe abdominal pain with nausea and vomiting. Lactate greater than 5. Khalid sepsis. EXAM: CT ABDOMEN AND PELVIS WITH CONTRAST TECHNIQUE: Multidetector CT imaging of the abdomen and pelvis was performed using the standard protocol following bolus administration of intravenous contrast. CONTRAST:  ISOVUE-300 IOPAMIDOL (ISOVUE-300) INJECTION 61% COMPARISON:  None. FINDINGS: Lower chest: Minor lung base atelectasis.  No pleural fluid. Hepatobiliary: Branching air in the left hepatic lobe felt to be portal venous gas rather than pneumobilia. No discrete focal hepatic lesion. Gallbladder physiologically distended, no calcified stone. No biliary dilatation. Pancreas: No ductal dilatation or inflammation. Spleen: Normal in size without focal abnormality. Adrenals/Urinary Tract: Normal adrenal glands. No hydronephrosis or perinephric edema. Homogeneous renal enhancement with symmetric excretion on delayed phase imaging. Urinary bladder is physiologically distended without wall thickening. Stomach/Bowel: Gaseous gastric distention with gastric pneumatosis about the cardia and antrum with mild associated wall thickening. Small bowel dilated and fluid-filled with mesenteric edema in the lower abdomen. General transition from dilated to nondilated small bowel without discrete transition point. Equivocal pneumatosis involving small bowel in the central abdomen, favored to be intraluminal air, for example image 42 series 3. Fluid/liquid stool  in the cecum and ascending colon. No colonic wall thickening or evidence of diverticulitis. Sigmoid colon is tortuous. Appendix not definitively visualized. Vascular/Lymphatic: No mesenteric venous gas. Normal caliber abdominal aorta. Mesenteric vessels are patent. No enlarged abdominal or pelvic lymph nodes. Reproductive: Prostate is unremarkable. Other: Small amount of free fluid in the pericolic gutters and left upper quadrant. Mild mesenteric edema small amount of mesenteric fluid. No free air. Musculoskeletal: There are no acute or suspicious osseous abnormalities. IMPRESSION: 1. Gastric pneumatosis with branching air in the left hepatic lobe suspicious for portal venous gas rather than pneumobilia. 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 2. Fluid-filled dilated proximal small bowel without discrete transition point to suggest obstruction. Small foci of air within upper abdominal small bowel felt to be intraluminal, however  additional foci of pneumatosis are also considered. Findings may be reactive ileus or enteritis. Critical Value/emergent results were called by telephone at the time of interpretation on 05/15/2018 at 12:56 am to [facility] in the Emergency Department, who verbally acknowledged these results. Electronically Signed   By: Narda Rutherford M.D.   On: 05/15/2018 00:58   Dg Chest Port 1 View  Result Date: 05/14/2018 CLINICAL DATA:  Shortness of breath.  Nausea and vomiting for 1 day. EXAM: PORTABLE CHEST 1 VIEW COMPARISON:  02/20/2018 FINDINGS: The heart size and mediastinal contours are within normal limits. Decreased lung volumes. Both lungs are clear. The visualized skeletal structures are unremarkable. IMPRESSION: 1. No acute findings. 2. Decreased lung volumes. Electronically Signed   By: Signa Kell M.D.   On: 05/14/2018 19:51    EKG: Independently reviewed. Sinus with sinus arrhythmia, LVH.   Assessment/Plan  1. Emphysematous gastritis; severe sepsis   - Presents with  abdominal pain, nausea, and diaphoresis  - Found to be tachycardic with stable BP, marked leukocytosis, lactic acid 5.49, and CT-findings with emphysematous gastritis  - Blood cultures were collected, 30 cc/kg NS bolus given, and he was started on broad-spectrum antibiotics  - Surgery is consulting and much appreciated  - Continue bowel-rest, IVF hydration, empiric antibiotics, analgesia, antiemetics, PPI    2. Acute kidney injury  - SCr is 1.32 on admission, up from 0.85 last year  - Likely a prerenal azotemia in setting of sepsis and vomiting; ATN possible  - Fluid-resuscitated in ED with 30 cc/kg NS  - Renally-dose medications, continue IVF hydration, repeat chem panel in am    3. Hypertension  - BP at goal  - HCTZ held while hydrating    4. Hypokalemia  - KCl added to IVF, repeat chem panel in am    DVT prophylaxis: SCD's  Code Status: Full  Family Communication: Significant other updated at bedside Consults called: Surgery  Admission status: Inpatient     Briscoe Deutscher, MD [facility] Pager [phone]  If 7PM-7AM, please contact night-coverage [email or web address] Password [email or web address]  05/15/2018, 1:34 AM

## 2018-05-15 NOTE — ED Notes (Signed)
Admitting MD at bedside.

## 2018-05-15 NOTE — ED Notes (Signed)
Pharmacist notified to send pt.'s Cefepime IV order.

## 2018-05-15 NOTE — ED Notes (Signed)
Confirmed IV compatibility in micromedex prior to administering potassium chloride, vancomycin, and famotidine together.

## 2018-05-15 NOTE — Plan of Care (Signed)
  Problem: Education: Goal: Knowledge of General Education information will improve Description Including pain rating scale, medication(s)/side effects and non-pharmacologic comfort measures Outcome: Progressing   

## 2018-05-15 NOTE — Progress Notes (Signed)
Patient arrived on unit at 1540, patient vitals taken, skin inspected by two RN's, patient can walk and states his abdominal pain is a 2.

## 2018-05-16 ENCOUNTER — Other Ambulatory Visit: Payer: Self-pay | Admitting: Urgent Care

## 2018-05-16 DIAGNOSIS — K219 Gastro-esophageal reflux disease without esophagitis: Secondary | ICD-10-CM

## 2018-05-16 DIAGNOSIS — I10 Essential (primary) hypertension: Secondary | ICD-10-CM

## 2018-05-16 DIAGNOSIS — N179 Acute kidney failure, unspecified: Secondary | ICD-10-CM

## 2018-05-16 DIAGNOSIS — K2960 Other gastritis without bleeding: Secondary | ICD-10-CM

## 2018-05-16 LAB — COMPREHENSIVE METABOLIC PANEL
ALT: 15 U/L (ref 0–44)
AST: 38 U/L (ref 15–41)
Albumin: 2.9 g/dL — ABNORMAL LOW (ref 3.5–5.0)
Alkaline Phosphatase: 53 U/L (ref 38–126)
Anion gap: 10 (ref 5–15)
BUN: 8 mg/dL (ref 6–20)
CHLORIDE: 107 mmol/L (ref 98–111)
CO2: 21 mmol/L — AB (ref 22–32)
Calcium: 7.6 mg/dL — ABNORMAL LOW (ref 8.9–10.3)
Creatinine, Ser: 0.79 mg/dL (ref 0.61–1.24)
Glucose, Bld: 103 mg/dL — ABNORMAL HIGH (ref 70–99)
POTASSIUM: 3.5 mmol/L (ref 3.5–5.1)
SODIUM: 138 mmol/L (ref 135–145)
Total Bilirubin: 1.4 mg/dL — ABNORMAL HIGH (ref 0.3–1.2)
Total Protein: 5.6 g/dL — ABNORMAL LOW (ref 6.5–8.1)

## 2018-05-16 LAB — CBC
HCT: 39 % (ref 39.0–52.0)
Hemoglobin: 13.4 g/dL (ref 13.0–17.0)
MCH: 30.1 pg (ref 26.0–34.0)
MCHC: 34.4 g/dL (ref 30.0–36.0)
MCV: 87.6 fL (ref 78.0–100.0)
PLATELETS: 171 10*3/uL (ref 150–400)
RBC: 4.45 MIL/uL (ref 4.22–5.81)
RDW: 13.2 % (ref 11.5–15.5)
WBC: 12.9 10*3/uL — AB (ref 4.0–10.5)

## 2018-05-16 LAB — GLUCOSE, CAPILLARY
GLUCOSE-CAPILLARY: 96 mg/dL (ref 70–99)
GLUCOSE-CAPILLARY: 89 mg/dL (ref 70–99)

## 2018-05-16 LAB — URINE CULTURE: Culture: NO GROWTH

## 2018-05-16 MED ORDER — TESTOSTERONE 50 MG/5GM (1%) TD GEL
5.0000 g | Freq: Every day | TRANSDERMAL | Status: DC
  Administered 2018-05-16 – 2018-05-17 (×2): 5 g via TRANSDERMAL
  Filled 2018-05-16 (×2): qty 5

## 2018-05-16 MED ORDER — ENOXAPARIN SODIUM 40 MG/0.4ML ~~LOC~~ SOLN
40.0000 mg | SUBCUTANEOUS | Status: DC
  Administered 2018-05-16 – 2018-05-17 (×2): 40 mg via SUBCUTANEOUS
  Filled 2018-05-16 (×2): qty 0.4

## 2018-05-16 NOTE — Progress Notes (Addendum)
[facility]/Trauma Progress Note      Assessment/Plan  Acute kidney injury Hypertension History IBS Hx Migraines BMI 34.4  Emphysematous gastritis - pt overall improving. Pain greatly improved. No nausea or vomiting. WBC trending down - pt with hx of GERD, would benefit from GI f/u outpt  FEN: IVF, Clears ID: Cefepime, Flagyl, vancomycin 9/9=>> day 2 DVT:  SCD's Follow up:  TBD   Plan:  continue antibiotics, advance to clears. No surgical intervention indicated at this time. We will follow.    LOS: 1 day    Subjective: CC: abdominal pain  Pain greatly improved. No nausea or vomiting. Pt is hungry. No family at bedside. No fever or chills overnight.   Objective: Vital signs in last 24 hours: Temp:  [98.4 F (36.9 C)-98.8 F (37.1 C)] 98.6 F (37 C) (09/11 0839) Pulse Rate:  [102-111] 108 (09/11 0113) Resp:  [12-26] 18 (09/11 0113) BP: (134-156)/(64-99) 143/86 (09/11 0113) SpO2:  [95 %-99 %] 97 % (09/11 0113) Weight:  [95.3 kg] 95.3 kg (09/10 1616) Last BM Date: 05/15/18  Intake/Output from previous day: 09/10 0701 - 09/11 0700 In: 2065.2 [I.V.:1147.2; IV Piggyback:918] Out: -  Intake/Output this shift: Total I/O In: -  Out: 700 [Urine:700]  PE: Gen:  Alert, NAD, pleasant, cooperative Pulm:  Rate and effort normal Abd: Soft, NT/ND, +BS, no HSM Skin: no rashes noted, warm and dry   Anti-infectives: Anti-infectives (From admission, onward)   Start     Dose/Rate Route Frequency Ordered Stop   05/15/18 1000  vancomycin (VANCOCIN) IVPB 750 mg/150 ml premix  Status:  Discontinued     750 mg 150 mL/hr over 60 Minutes Intravenous Every 12 hours 05/14/18 2130 05/15/18 0125   05/15/18 1000  vancomycin (VANCOCIN) IVPB 750 mg/150 ml premix     750 mg 150 mL/hr over 60 Minutes Intravenous Every 12 hours 05/15/18 0128     05/15/18 0600  ceFEPIme (MAXIPIME) 1 g in sodium chloride 0.9 % 100 mL IVPB     1 g 200 mL/hr over 30 Minutes Intravenous  Every 8 hours 05/14/18 2130     05/14/18 1945  ceFEPIme (MAXIPIME) 2 g in sodium chloride 0.9 % 100 mL IVPB     2 g 200 mL/hr over 30 Minutes Intravenous  Once 05/14/18 1935 05/14/18 2127   05/14/18 1945  metroNIDAZOLE (FLAGYL) IVPB 500 mg     500 mg 100 mL/hr over 60 Minutes Intravenous Every 8 hours 05/14/18 1935     05/14/18 1945  vancomycin (VANCOCIN) IVPB 1000 mg/200 mL premix  Status:  Discontinued     1,000 mg 200 mL/hr over 60 Minutes Intravenous  Once 05/14/18 1935 05/14/18 1936   05/14/18 1945  vancomycin (VANCOCIN) 2,000 mg in sodium chloride 0.9 % 500 mL IVPB     2,000 mg 250 mL/hr over 120 Minutes Intravenous  Once 05/14/18 1936 05/14/18 2338      Lab Results:  Recent Labs    05/15/18 0421 05/16/18 0357  WBC 16.7* 12.9*  HGB 15.3 13.4  HCT 47.1 39.0  PLT 262 171   BMET Recent Labs    05/15/18 0421 05/16/18 0357  NA 140 138  K 3.4* 3.5  CL 109 107  CO2 21* 21*  GLUCOSE 164* 103*  BUN 13 8  CREATININE 0.83 0.79  CALCIUM 7.9* 7.6*   PT/INR No results for input(s): LABPROT, INR in the last 72 hours. CMP     Component Value Date/Time   NA 138 05/16/2018 0357  K 3.5 05/16/2018 0357   CL 107 05/16/2018 0357   CO2 21 (L) 05/16/2018 0357   GLUCOSE 103 (H) 05/16/2018 0357   BUN 8 05/16/2018 0357   CREATININE 0.79 05/16/2018 0357   CREATININE 1.00 07/05/2016 1618   CALCIUM 7.6 (L) 05/16/2018 0357   PROT 5.6 (L) 05/16/2018 0357   ALBUMIN 2.9 (L) 05/16/2018 0357   AST 38 05/16/2018 0357   ALT 15 05/16/2018 0357   ALKPHOS 53 05/16/2018 0357   BILITOT 1.4 (H) 05/16/2018 0357   GFRNONAA >60 05/16/2018 0357   GFRNONAA 89 07/05/2016 1618   GFRAA >60 05/16/2018 0357   GFRAA >89 07/05/2016 1618   Lipase     Component Value Date/Time   LIPASE 29 05/14/2018 2023    Studies/Results: Ct Abdomen Pelvis W Contrast  Result Date: 05/15/2018 CLINICAL DATA:  Severe abdominal pain with nausea and vomiting. Lactate greater than 5. Khalid sepsis. EXAM: CT  ABDOMEN AND PELVIS WITH CONTRAST TECHNIQUE: Multidetector CT imaging of the abdomen and pelvis was performed using the standard protocol following bolus administration of intravenous contrast. CONTRAST:  ISOVUE-300 IOPAMIDOL (ISOVUE-300) INJECTION 61% COMPARISON:  None. FINDINGS: Lower chest: Minor lung base atelectasis.  No pleural fluid. Hepatobiliary: Branching air in the left hepatic lobe felt to be portal venous gas rather than pneumobilia. No discrete focal hepatic lesion. Gallbladder physiologically distended, no calcified stone. No biliary dilatation. Pancreas: No ductal dilatation or inflammation. Spleen: Normal in size without focal abnormality. Adrenals/Urinary Tract: Normal adrenal glands. No hydronephrosis or perinephric edema. Homogeneous renal enhancement with symmetric excretion on delayed phase imaging. Urinary bladder is physiologically distended without wall thickening. Stomach/Bowel: Gaseous gastric distention with gastric pneumatosis about the cardia and antrum with mild associated wall thickening. Small bowel dilated and fluid-filled with mesenteric edema in the lower abdomen. General transition from dilated to nondilated small bowel without discrete transition point. Equivocal pneumatosis involving small bowel in the central abdomen, favored to be intraluminal air, for example image 42 series 3. Fluid/liquid stool in the cecum and ascending colon. No colonic wall thickening or evidence of diverticulitis. Sigmoid colon is tortuous. Appendix not definitively visualized. Vascular/Lymphatic: No mesenteric venous gas. Normal caliber abdominal aorta. Mesenteric vessels are patent. No enlarged abdominal or pelvic lymph nodes. Reproductive: Prostate is unremarkable. Other: Small amount of free fluid in the pericolic gutters and left upper quadrant. Mild mesenteric edema small amount of mesenteric fluid. No free air. Musculoskeletal: There are no acute or suspicious osseous abnormalities.  IMPRESSION: 1. Gastric pneumatosis with branching air in the left hepatic lobe suspicious for portal venous gas rather than pneumobilia. 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 2. Fluid-filled dilated proximal small bowel without discrete transition point to suggest obstruction. Small foci of air within upper abdominal small bowel felt to be intraluminal, however additional foci of pneumatosis are also considered. Findings may be reactive ileus or enteritis. Critical Value/emergent results were called by telephone at the time of interpretation on 05/15/2018 at 12:56 am to [facility] in the Emergency Department, who verbally acknowledged these results. Electronically Signed   By: Narda Rutherford M.D.   On: 05/15/2018 00:58   Dg Chest Port 1 View  Result Date: 05/14/2018 CLINICAL DATA:  Shortness of breath.  Nausea and vomiting for 1 day. EXAM: PORTABLE CHEST 1 VIEW COMPARISON:  02/20/2018 FINDINGS: The heart size and mediastinal contours are within normal limits. Decreased lung volumes. Both lungs are clear. The visualized skeletal structures are unremarkable. IMPRESSION: 1. No acute findings. 2. Decreased lung volumes. Electronically Signed  By: Signa Kell M.D.   On: 05/14/2018 19:51      Jerre Simon , [facility] 05/16/2018, 9:09 AM  Pager: [phone] Mon-Wed, Friday 7:00am-4:30pm Thurs 7am-11:30am  Consults: [phone]

## 2018-05-16 NOTE — Progress Notes (Signed)
PROGRESS NOTE        PATIENT DETAILS Name: Steven Holland Age: 47 y.o. Sex: male Date of Birth: [birth year] Admit Date: 05/14/2018 Admitting Physician Briscoe Deutscher, MD ZOX:[id number], Aldine Contes, MD  Brief Narrative: Patient is a 47 y.o. male with prior history of hypertension, IBS presenting with abdominal pain, vomiting-emphysematous gastritis with sepsis pathophysiology along with acute kidney injury.  Patient was provided supportive care-kept n.p.o.-IV fluids, IV antibiotics were provided-General surgery was consulted-with these measures patient has improved.  See below for further details.  Subjective: Abdominal pain has improved-patient had a small BM last night.  Passing flatus this morning.  Assessment/Plan: Sepsis secondary to emphysematous gastritis: Sepsis pathophysiology has resolved.  Suspect emphysematous gastritis is secondary to infectious etiology.  Blood cultures continue to be negative.  Clinically improved with empiric antimicrobial therapy, n.p.o. status and other supportive care.  Stop vancomycin-continue cefepime and Flagyl-if clinical improvement continues suspect we could narrow antimicrobial regimen even further.  Appreciate general surgical input-starting clear liquids today-we will slowly advance as tolerated.  Acute kidney injury: Likely hemodynamically mediated in the setting of vomiting, supportive care.  Hypertension: Blood pressure currently controlled without the use of antihypertensives-resume when able.  Low testosterone: Continue transdermal testosterone.  DVT Prophylaxis: Prophylactic Lovenox  Code Status: Full code   Family Communication: None at bedside  Disposition Plan: Remain inpatient-home in the next day or two  Antimicrobial agents: Anti-infectives (From admission, onward)   Start     Dose/Rate Route Frequency Ordered Stop   05/15/18 1000  vancomycin (VANCOCIN) IVPB 750 mg/150 ml premix  Status:   Discontinued     750 mg 150 mL/hr over 60 Minutes Intravenous Every 12 hours 05/14/18 2130 05/15/18 0125   05/15/18 1000  vancomycin (VANCOCIN) IVPB 750 mg/150 ml premix  Status:  Discontinued     750 mg 150 mL/hr over 60 Minutes Intravenous Every 12 hours 05/15/18 0128 05/16/18 0928   05/15/18 0600  ceFEPIme (MAXIPIME) 1 g in sodium chloride 0.9 % 100 mL IVPB     1 g 200 mL/hr over 30 Minutes Intravenous Every 8 hours 05/14/18 2130     05/14/18 1945  ceFEPIme (MAXIPIME) 2 g in sodium chloride 0.9 % 100 mL IVPB     2 g 200 mL/hr over 30 Minutes Intravenous  Once 05/14/18 1935 05/14/18 2127   05/14/18 1945  metroNIDAZOLE (FLAGYL) IVPB 500 mg     500 mg 100 mL/hr over 60 Minutes Intravenous Every 8 hours 05/14/18 1935     05/14/18 1945  vancomycin (VANCOCIN) IVPB 1000 mg/200 mL premix  Status:  Discontinued     1,000 mg 200 mL/hr over 60 Minutes Intravenous  Once 05/14/18 1935 05/14/18 1936   05/14/18 1945  vancomycin (VANCOCIN) 2,000 mg in sodium chloride 0.9 % 500 mL IVPB     2,000 mg 250 mL/hr over 120 Minutes Intravenous  Once 05/14/18 1936 05/14/18 2338      Procedures: None  CONSULTS:  general surgery  Time spent: 25- minutes-Greater than 50% of this time was spent in counseling, explanation of diagnosis, planning of further management, and coordination of care.  MEDICATIONS: Scheduled Meds: . pantoprazole  40 mg Intravenous Q12H  . sodium chloride flush  3 mL Intravenous Q12H  . testosterone  5 g Transdermal Daily   Continuous Infusions: . 0.9 % NaCl with KCl 20 mEq /  L Stopped (05/16/18 1021)  . ceFEPime (MAXIPIME) IV 1 g (05/16/18 1057)  . famotidine (PEPCID) IV 20 mg (05/15/18 2253)  . metronidazole 500 mg (05/16/18 1116)   PRN Meds:.acetaminophen **OR** acetaminophen, fentaNYL (SUBLIMAZE) injection, ondansetron **OR** ondansetron (ZOFRAN) IV   PHYSICAL EXAM: Vital signs: Vitals:   05/15/18 2000 05/15/18 2028 05/16/18 0113 05/16/18 0839  BP: 139/85  (!)  143/86   Pulse: (!) 106  (!) 108   Resp: (!) 21  18   Temp:  98.4 F (36.9 C) 98.8 F (37.1 C) 98.6 F (37 C)  TempSrc:  Oral Oral Oral  SpO2: 98%  97%   Weight:      Height:       Filed Weights   05/14/18 1907 05/15/18 1616  Weight: 102.7 kg 95.3 kg   Body mass index is 31.93 kg/m.   General appearance :Awake, alert, not in any distress. Speech Clear. Not toxic Looking Eyes:, pupils equally reactive to light and accomodation,no scleral icterus.Pink conjunctiva HEENT: Atraumatic and Normocephalic Neck: supple, no JVD. No cervical lymphadenopathy. No thyromegaly Resp:Good air entry bilaterally, no added sounds  CVS: S1 S2 regular, no murmurs.  GI: Bowel sounds present, Non tender and not distended with no gaurding, rigidity or rebound.No organomegaly Extremities: B/L Lower Ext shows no edema, both legs are warm to touch Neurology:  speech clear,Non focal, sensation is grossly intact. Psychiatric: Normal judgment and insight. Alert and oriented x 3. Normal mood. Musculoskeletal:No digital cyanosis Skin:No Rash, warm and dry Wounds:N/A  I have personally reviewed following labs and imaging studies  LABORATORY DATA: CBC: Recent Labs  Lab 05/14/18 2023 05/15/18 0421 05/16/18 0357  WBC 18.9* 16.7* 12.9*  NEUTROABS 15.4* 14.1*  --   HGB 17.9* 15.3 13.4  HCT 54.6* 47.1 39.0  MCV 89.5 90.1 87.6  PLT 364 262 171    Basic Metabolic Panel: Recent Labs  Lab 05/14/18 2023 05/15/18 0421 05/16/18 0357  NA 140 140 138  K 3.4* 3.4* 3.5  CL 102 109 107  CO2 24 21* 21*  GLUCOSE 148* 164* 103*  BUN 20 13 8   CREATININE 1.32* 0.83 0.79  CALCIUM 10.0 7.9* 7.6*    GFR: Estimated Creatinine Clearance: 126.5 mL/min (by C-G formula based on SCr of 0.79 mg/dL).  Liver Function Tests: Recent Labs  Lab 05/14/18 2023 05/16/18 0357  AST 22 38  ALT 25 15  ALKPHOS 98 53  BILITOT 0.7 1.4*  PROT 8.2* 5.6*  ALBUMIN 4.4 2.9*   Recent Labs  Lab 05/14/18 2023  LIPASE 29    No results for input(s): AMMONIA in the last 168 hours.  Coagulation Profile: No results for input(s): INR, PROTIME in the last 168 hours.  Cardiac Enzymes: No results for input(s): CKTOTAL, CKMB, CKMBINDEX, TROPONINI in the last 168 hours.  BNP (last 3 results) No results for input(s): PROBNP in the last 8760 hours.  HbA1C: No results for input(s): HGBA1C in the last 72 hours.  CBG: Recent Labs  Lab 05/16/18 0810  GLUCAP 96    Lipid Profile: No results for input(s): CHOL, HDL, LDLCALC, TRIG, CHOLHDL, LDLDIRECT in the last 72 hours.  Thyroid Function Tests: No results for input(s): TSH, T4TOTAL, FREET4, T3FREE, THYROIDAB in the last 72 hours.  Anemia Panel: No results for input(s): VITAMINB12, FOLATE, FERRITIN, TIBC, IRON, RETICCTPCT in the last 72 hours.  Urine analysis:    Component Value Date/Time   COLORURINE STRAW (A) 05/15/2018 0015   APPEARANCEUR CLEAR 05/15/2018 0015   LABSPEC 1.031 (H)  05/15/2018 0015   PHURINE 7.0 05/15/2018 0015   GLUCOSEU NEGATIVE 05/15/2018 0015   HGBUR NEGATIVE 05/15/2018 0015   BILIRUBINUR NEGATIVE 05/15/2018 0015   BILIRUBINUR neg 02/03/2012 1455   KETONESUR NEGATIVE 05/15/2018 0015   PROTEINUR NEGATIVE 05/15/2018 0015   UROBILINOGEN 0.2 02/03/2012 1455   NITRITE NEGATIVE 05/15/2018 0015   LEUKOCYTESUR NEGATIVE 05/15/2018 0015    Sepsis Labs: Lactic Acid, Venous    Component Value Date/Time   LATICACIDVEN 1.3 05/15/2018 0421    MICROBIOLOGY: Recent Results (from the past 240 hour(s))  Blood Culture (routine x 2)     Status: None (Preliminary result)   Collection Time: 05/14/18  8:15 PM  Result Value Ref Range Status   Specimen Description BLOOD RIGHT ANTECUBITAL  Final   Special Requests   Final    BOTTLES DRAWN AEROBIC AND ANAEROBIC Blood Culture results may not be optimal due to an inadequate volume of blood received in culture bottles   Culture   Final    NO GROWTH 2 DAYS Performed at [facility],  [address] [place], [place] [address]    Report Status PENDING  Incomplete  Blood Culture (routine x 2)     Status: None (Preliminary result)   Collection Time: 05/14/18  8:40 PM  Result Value Ref Range Status   Specimen Description BLOOD LEFT HAND  Final   Special Requests   Final    BOTTLES DRAWN AEROBIC AND ANAEROBIC Blood Culture adequate volume   Culture   Final    NO GROWTH 2 DAYS Performed at [facility], [address], [place], [place] [address]    Report Status PENDING  Incomplete  Urine culture     Status: None   Collection Time: 05/15/18  2:15 AM  Result Value Ref Range Status   Specimen Description URINE, RANDOM  Final   Special Requests   Final    NONE Performed at [facility], [address], [place], [place] [address]    Culture NO GROWTH  Final   Report Status 05/16/2018 FINAL  Final  MRSA PCR Screening     Status: None   Collection Time: 05/15/18  4:06 PM  Result Value Ref Range Status   MRSA by PCR NEGATIVE NEGATIVE Final    Comment:        The GeneXpert MRSA Assay (FDA approved for NASAL specimens only), is one component of a comprehensive MRSA colonization surveillance program. It is not intended to diagnose MRSA infection nor to guide or monitor treatment for MRSA infections. Performed at [facility], [address] [place], [place] [address]     RADIOLOGY STUDIES/RESULTS: Ct Abdomen Pelvis W Contrast  Result Date: 05/15/2018 CLINICAL DATA:  Severe abdominal pain with nausea and vomiting. Lactate greater than 5. Khalid sepsis. EXAM: CT ABDOMEN AND PELVIS WITH CONTRAST TECHNIQUE: Multidetector CT imaging of the abdomen and pelvis was performed using the standard protocol following bolus administration of intravenous contrast. CONTRAST:  ISOVUE-300 IOPAMIDOL (ISOVUE-300) INJECTION 61% COMPARISON:  None. FINDINGS: Lower chest: Minor lung base atelectasis.  No pleural fluid. Hepatobiliary: Branching air in the left hepatic  lobe felt to be portal venous gas rather than pneumobilia. No discrete focal hepatic lesion. Gallbladder physiologically distended, no calcified stone. No biliary dilatation. Pancreas: No ductal dilatation or inflammation. Spleen: Normal in size without focal abnormality. Adrenals/Urinary Tract: Normal adrenal glands. No hydronephrosis or perinephric edema. Homogeneous renal enhancement with symmetric excretion on delayed phase imaging. Urinary bladder  is physiologically distended without wall thickening. Stomach/Bowel: Gaseous gastric distention with gastric pneumatosis about the cardia and antrum with mild associated wall thickening. Small bowel dilated and fluid-filled with mesenteric edema in the lower abdomen. General transition from dilated to nondilated small bowel without discrete transition point. Equivocal pneumatosis involving small bowel in the central abdomen, favored to be intraluminal air, for example image 42 series 3. Fluid/liquid stool in the cecum and ascending colon. No colonic wall thickening or evidence of diverticulitis. Sigmoid colon is tortuous. Appendix not definitively visualized. Vascular/Lymphatic: No mesenteric venous gas. Normal caliber abdominal aorta. Mesenteric vessels are patent. No enlarged abdominal or pelvic lymph nodes. Reproductive: Prostate is unremarkable. Other: Small amount of free fluid in the pericolic gutters and left upper quadrant. Mild mesenteric edema small amount of mesenteric fluid. No free air. Musculoskeletal: There are no acute or suspicious osseous abnormalities. IMPRESSION: 1. Gastric pneumatosis with branching air in the left hepatic lobe suspicious for portal venous gas rather than pneumobilia. 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 2. Fluid-filled dilated proximal small bowel without discrete transition point to suggest obstruction. Small foci of air within upper abdominal small bowel felt to be intraluminal, however additional foci of  pneumatosis are also considered. Findings may be reactive ileus or enteritis. Critical Value/emergent results were called by telephone at the time of interpretation on 05/15/2018 at 12:56 am to [facility] in the Emergency Department, who verbally acknowledged these results. Electronically Signed   By: Narda Rutherford M.D.   On: 05/15/2018 00:58   Dg Chest Port 1 View  Result Date: 05/14/2018 CLINICAL DATA:  Shortness of breath.  Nausea and vomiting for 1 day. EXAM: PORTABLE CHEST 1 VIEW COMPARISON:  02/20/2018 FINDINGS: The heart size and mediastinal contours are within normal limits. Decreased lung volumes. Both lungs are clear. The visualized skeletal structures are unremarkable. IMPRESSION: 1. No acute findings. 2. Decreased lung volumes. Electronically Signed   By: Signa Kell M.D.   On: 05/14/2018 19:51     LOS: 1 day   Jeoffrey Massed, MD  [facility]  If 7PM-7AM, please contact night-coverage  Please page via [email or web address]-Password [email or web address]-click on MD name and type text message  05/16/2018, 11:56 AM

## 2018-05-16 NOTE — Telephone Encounter (Signed)
Rx refill request: omeprazole 20 mg      Ordered: 08/23/17 2 Rf #[id number]   Outside provider  LOV: 12/15/17  PCP: Durene Cal  Pharmacy: verified   Patient presently admitted to hospital: gastritis

## 2018-05-17 DIAGNOSIS — R6520 Severe sepsis without septic shock: Secondary | ICD-10-CM

## 2018-05-17 DIAGNOSIS — A419 Sepsis, unspecified organism: Principal | ICD-10-CM

## 2018-05-17 MED ORDER — AMOXICILLIN-POT CLAVULANATE 875-125 MG PO TABS: 1.0000 | ORAL_TABLET | Freq: Two times a day (BID) | ORAL | 0 refills | Status: DC

## 2018-05-17 MED ORDER — FAMOTIDINE 20 MG PO TABS: 20.0000 mg | ORAL_TABLET | Freq: Two times a day (BID) | ORAL | Status: DC

## 2018-05-17 MED ORDER — PANTOPRAZOLE SODIUM 40 MG PO TBEC: 40.0000 mg | DELAYED_RELEASE_TABLET | Freq: Two times a day (BID) | ORAL | Status: DC

## 2018-05-17 MED ORDER — AMOXICILLIN-POT CLAVULANATE 875-125 MG PO TABS: 1.0000 | ORAL_TABLET | Freq: Two times a day (BID) | ORAL | Status: DC

## 2018-05-17 NOTE — Progress Notes (Signed)
Discharge instructions given. Pt verbalized understanding and all questions were answered.  

## 2018-05-17 NOTE — Plan of Care (Signed)
POC reviewed with patient. No acute events throughout shift. RN to report off to ongoing Rn.

## 2018-05-17 NOTE — Final Consult Note (Signed)
Consultant Final Sign-Off Note    Assessment/Final recommendations  Steven Holland is a 47 y.o. male followed by me for emphysematous gastritis    Wound care (if applicable):    Diet at discharge: soft/full liquid diet for 1 week    Activity at discharge: per primary team   Follow-up appointment:  Call as needed   Pending results:  Unresulted Labs (From admission, onward)    Start     Ordered   05/17/18 0904  CBC  STAT,   R    Question:  Specimen collection method  Answer:  Lab=Lab collect   05/17/18 0903           Medication recommendations: Augmentin for 2 weeks   Other recommendations: GI follow up with EGD    Thank you for allowing [place] to participate in the care of your patient!  Please consult [place] again if you have further needs for your patient.  Jerre Simon 05/17/2018 9:19 AM    Subjective   CC: emphysematous gastritis  Pt denies abdominal pain, nausea, vomiting, fever, chills. No issues overnight. No family at bedside. Tolerating diet.   Objective  Vital signs in last 24 hours: Temp:  [98.3 F (36.8 C)-98.7 F (37.1 C)] 98.7 F (37.1 C) (09/12 0557) Pulse Rate:  [92-105] 92 (09/12 0557) Resp:  [18] 18 (09/12 0557) BP: (137-157)/(85-95) 137/95 (09/12 0557) SpO2:  [96 %-100 %] 97 % (09/12 0557)  PE: Gen:  Alert, NAD, pleasant, cooperative Pulm:  Rate and effort normal Abd: Soft, NT/ND, +BS, no HSM Skin: no rashes noted, warm and dry  Pertinent labs and Studies: Recent Labs    05/14/18 2023 05/15/18 0421 05/16/18 0357  WBC 18.9* 16.7* 12.9*  HGB 17.9* 15.3 13.4  HCT 54.6* 47.1 39.0   BMET Recent Labs    05/15/18 0421 05/16/18 0357  NA 140 138  K 3.4* 3.5  CL 109 107  CO2 21* 21*  GLUCOSE 164* 103*  BUN 13 8  CREATININE 0.83 0.79  CALCIUM 7.9* 7.6*   No results for input(s): LABURIN in the last 72 hours. Results for orders placed or performed during the hospital encounter of 05/14/18  Blood Culture (routine x 2)      Status: None (Preliminary result)   Collection Time: 05/14/18  8:15 PM  Result Value Ref Range Status   Specimen Description BLOOD RIGHT ANTECUBITAL  Final   Special Requests   Final    BOTTLES DRAWN AEROBIC AND ANAEROBIC Blood Culture results may not be optimal due to an inadequate volume of blood received in culture bottles   Culture   Final    NO GROWTH 3 DAYS Performed at [facility], [address], [place], [place] [address]    Report Status PENDING  Incomplete  Blood Culture (routine x 2)     Status: None (Preliminary result)   Collection Time: 05/14/18  8:40 PM  Result Value Ref Range Status   Specimen Description BLOOD LEFT HAND  Final   Special Requests   Final    BOTTLES DRAWN AEROBIC AND ANAEROBIC Blood Culture adequate volume   Culture   Final    NO GROWTH 3 DAYS Performed at [facility], [address] [place], [place] [address]    Report Status PENDING  Incomplete  Urine culture     Status: None   Collection Time: 05/15/18  2:15 AM  Result Value Ref Range Status   Specimen Description URINE, RANDOM  Final  Special Requests   Final    NONE Performed at [facility], [address] [place], [place] [address]    Culture NO GROWTH  Final   Report Status 05/16/2018 FINAL  Final  MRSA PCR Screening     Status: None   Collection Time: 05/15/18  4:06 PM  Result Value Ref Range Status   MRSA by PCR NEGATIVE NEGATIVE Final    Comment:        The GeneXpert MRSA Assay (FDA approved for NASAL specimens only), is one component of a comprehensive MRSA colonization surveillance program. It is not intended to diagnose MRSA infection nor to guide or monitor treatment for MRSA infections. Performed at [facility], [address] [place], [place] [address]     Imaging: No results found.

## 2018-05-17 NOTE — Discharge Summary (Addendum)
PATIENT DETAILS Name: Steven Holland Age: 47 y.o. Sex: male Date of Birth: 04/05/1971 MRN: [id number]. Admitting Physician: Briscoe Deutscher, MD YQM:VHQION, Aldine Contes, MD  Admit Date: 05/14/2018 Discharge date: 05/17/2018  Recommendations for Outpatient Follow-up:  1. Follow up with PCP in 1-2 weeks 2. Please obtain BMP/CBC in one week 3. 2 more weeks of Augmentin at the suggestion of general surgery 4. Ensure follow-up with general surgery 5. Follow blood cultures until final  Admitted From:  Home   Disposition: Home   Home Health: No  Equipment/Devices: None  Discharge Condition: Stable  CODE STATUS: FULL CODE  Diet recommendation:  Heart Healthy   Brief Summary: See H&P, Labs, Consult and Test reports for all details in brief,Patient is a 47 y.o. male with prior history of hypertension, IBS presenting with abdominal pain, vomiting-emphysematous gastritis with sepsis pathophysiology along with acute kidney injury.  Patient was provided supportive care-kept n.p.o.-IV fluids, IV antibiotics were provided-General surgery was consulted-with these measures patient has improved.  See below for further details.  Brief Hospital Course: Sepsis secondary to emphysematous gastritis: Sepsis pathophysiology has resolved. Suspect emphysematous gastritis is secondary to infectious etiology.  Blood cultures continue to be negative.  Clinically improved with empiric antimicrobial therapy, n.p.o. status and other supportive care.    Diet has been advanced-and patient has tolerated diet well.  Abdominal pain has resolved, patient is passing gas and is also having daily BMs.  Since clinically improved-we will transition to Augmentin on discharge home.  General surgery recommending 2 weeks of Augmentin-will require close outpatient follow-up with PCP and general surgery.    Acute kidney injury: Likely hemodynamically mediated in the setting of vomiting, supportive  care.  Hypertension: Blood pressure currently controlled without the use of antihypertensives-resume when able.  Low testosterone: Continue transdermal testosterone   Procedures/Studies: None  Discharge Diagnoses:  Principal Problem:   Emphysematous gastritis Active Problems:   HTN (hypertension)   Severe sepsis (HCC)   AKI (acute kidney injury) (HCC)   Hypokalemia   Discharge Instructions:  Activity:  As tolerated   Discharge Instructions    Call MD for:  persistant nausea and vomiting   Complete by:  As directed    Call MD for:  severe uncontrolled pain   Complete by:  As directed    Call MD for:  temperature >100.4   Complete by:  As directed    Diet - low sodium heart healthy   Complete by:  As directed    Stay on soft bland food for at least 1 more week.   Discharge instructions   Complete by:  As directed    Follow with Primary MD  Shelva Majestic, MD in 1 week  Stay on a soft bland food for 1 week  Please get a complete blood count and chemistry panel checked by your Primary MD at your next visit, and again as instructed by your Primary MD.  Get Medicines reviewed and adjusted: Please take all your medications with you for your next visit with your Primary MD  Laboratory/radiological data: Please request your Primary MD to go over all hospital tests and procedure/radiological results at the follow up, please ask your Primary MD to get all Hospital records sent to his/her office.  In some cases, they will be blood work, cultures and biopsy results pending at the time of your discharge. Please request that your primary care M.D. follows up on these results.  Also Note the following: If you experience worsening  of your admission symptoms, develop shortness of breath, life threatening emergency, suicidal or homicidal thoughts you must seek medical attention immediately by calling 911 or calling your MD immediately  if symptoms less severe.  You must read  complete instructions/literature along with all the possible adverse reactions/side effects for all the Medicines you take and that have been prescribed to you. Take any new Medicines after you have completely understood and accpet all the possible adverse reactions/side effects.   Do not drive when taking Pain medications or sleeping medications (Benzodaizepines)  Do not take more than prescribed Pain, Sleep and Anxiety Medications. It is not advisable to combine anxiety,sleep and pain medications without talking with your primary care practitioner  Special Instructions: If you have smoked or chewed Tobacco  in the last 2 yrs please stop smoking, stop any regular Alcohol  and or any Recreational drug use.  Wear Seat belts while driving.  Please note: You were cared for by a hospitalist during your hospital stay. Once you are discharged, your primary care physician will handle any further medical issues. Please note that NO REFILLS for any discharge medications will be authorized once you are discharged, as it is imperative that you return to your primary care physician (or establish a relationship with a primary care physician if you do not have one) for your post hospital discharge needs so that they can reassess your need for medications and monitor your lab values.   Increase activity slowly   Complete by:  As directed      Allergies as of 05/17/2018      Reactions   Sulfa Antibiotics Swelling   Eyes became swollen and ear became swollen (localized)   Milk-related Compounds Other (See Comments)   Intense abdominal pain and passing out if he consumes over 4 ounces      Medication List    TAKE these medications   amoxicillin-clavulanate 875-125 MG tablet Commonly known as:  AUGMENTIN Take 1 tablet by mouth every 12 (twelve) hours.   butalbital-acetaminophen-caffeine 50-325-40-30 MG capsule Commonly known as:  FIORICET WITH CODEINE Take 1 capsule by mouth every 6 (six) hours as  needed (for migraines).   Erenumab-aooe 70 MG/ML Soaj Inject 140 mg into the skin every 30 (thirty) days.   folic acid 400 MCG tablet Commonly known as:  FOLVITE Take 400 mcg by mouth daily.   hydrochlorothiazide 12.5 MG capsule Commonly known as:  MICROZIDE Take 1 capsule (12.5 mg total) by mouth daily. What changed:  when to take this   Magnesium 250 MG Tabs Take 250 mg by mouth daily.   multivitamin with minerals Tabs tablet Take 1 tablet by mouth daily.   naproxen sodium 220 MG tablet Commonly known as:  ALEVE Take 220-440 mg by mouth 2 (two) times daily as needed (for pain).   nortriptyline 25 MG capsule Commonly known as:  PAMELOR Take 1 capsule (25 mg total) by mouth at bedtime.   omeprazole 20 MG capsule Commonly known as:  PRILOSEC TAKE 1 CAPSULE (20 MG TOTAL) BY MOUTH DAILY. What changed:  when to take this   ondansetron 8 MG tablet Commonly known as:  ZOFRAN TAKE 1 TABLET (8 MG TOTAL) BY MOUTH EVERY 8 (EIGHT) HOURS AS NEEDED. What changed:    how much to take  how to take this  when to take this  reasons to take this  additional instructions   Potassium Bicarbonate 99 MG Caps Take 1 capsule by mouth daily.   SPRIX 15.75 MG/SPRAY  Soln Generic drug:  Ketorolac Tromethamine Place 1 spray into both nostrils once as needed (for migraines).   Tadalafil 2.5 MG Tabs TAKE 1 TABLET (2.5 MG TOTAL) BY MOUTH DAILY.   Testosterone 20.25 MG/ACT (1.62%) Gel APPLY 4 PUMPS TOPICALLY DAILY.   vitamin B-12 1000 MCG tablet Commonly known as:  CYANOCOBALAMIN Take 1,000 mcg by mouth daily.      Follow-up Information    [facility] [place] Call.   Specialty:  General Surgery Why:  as needed with questions or concerns Contact information: [address] [place] [id number] [phone]       Shelva MajesticHunter, Stephen O, MD. Schedule an appointment as soon as possible for a visit in 1 week(s).   Specialty:  Family  Medicine Contact information: [address] [place] [id number] [phone]          Allergies  Allergen Reactions  . Sulfa Antibiotics Swelling    Eyes became swollen and ear became swollen (localized)  . Milk-Related Compounds Other (See Comments)    Intense abdominal pain and passing out if he consumes over 4 ounces    Consultations:   general surgery  Other Procedures/Studies: Ct Abdomen Pelvis W Contrast  Result Date: 05/15/2018 CLINICAL DATA:  Severe abdominal pain with nausea and vomiting. Lactate greater than 5. Khalid sepsis. EXAM: CT ABDOMEN AND PELVIS WITH CONTRAST TECHNIQUE: Multidetector CT imaging of the abdomen and pelvis was performed using the standard protocol following bolus administration of intravenous contrast. CONTRAST:  100mL ISOVUE-300 IOPAMIDOL (ISOVUE-300) INJECTION 61% COMPARISON:  None. FINDINGS: Lower chest: Minor lung base atelectasis.  No pleural fluid. Hepatobiliary: Branching air in the left hepatic lobe felt to be portal venous gas rather than pneumobilia. No discrete focal hepatic lesion. Gallbladder physiologically distended, no calcified stone. No biliary dilatation. Pancreas: No ductal dilatation or inflammation. Spleen: Normal in size without focal abnormality. Adrenals/Urinary Tract: Normal adrenal glands. No hydronephrosis or perinephric edema. Homogeneous renal enhancement with symmetric excretion on delayed phase imaging. Urinary bladder is physiologically distended without wall thickening. Stomach/Bowel: Gaseous gastric distention with gastric pneumatosis about the cardia and antrum with mild associated wall thickening. Small bowel dilated and fluid-filled with mesenteric edema in the lower abdomen. General transition from dilated to nondilated small bowel without discrete transition point. Equivocal pneumatosis involving small bowel in the central abdomen, favored to be intraluminal air, for example image 42 series 3. Fluid/liquid  stool in the cecum and ascending colon. No colonic wall thickening or evidence of diverticulitis. Sigmoid colon is tortuous. Appendix not definitively visualized. Vascular/Lymphatic: No mesenteric venous gas. Normal caliber abdominal aorta. Mesenteric vessels are patent. No enlarged abdominal or pelvic lymph nodes. Reproductive: Prostate is unremarkable. Other: Small amount of free fluid in the pericolic gutters and left upper quadrant. Mild mesenteric edema small amount of mesenteric fluid. No free air. Musculoskeletal: There are no acute or suspicious osseous abnormalities. IMPRESSION: 1. Gastric pneumatosis with branching air in the left hepatic lobe suspicious for portal venous gas rather than pneumobilia. 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 2. Fluid-filled dilated proximal small bowel without discrete transition point to suggest obstruction. Small foci of air within upper abdominal small bowel felt to be intraluminal, however additional foci of pneumatosis are also considered. Findings may be reactive ileus or enteritis. Critical Value/emergent results were called by telephone at the time of interpretation on 05/15/2018 at 12:56 am to [facility] in the Emergency Department, who verbally acknowledged these results. Electronically Signed   By: Narda RutherfordMelanie  Sanford  M.D.   On: 05/15/2018 00:58   Dg Chest Port 1 View  Result Date: 05/14/2018 CLINICAL DATA:  Shortness of breath.  Nausea and vomiting for 1 day. EXAM: PORTABLE CHEST 1 VIEW COMPARISON:  02/20/2018 FINDINGS: The heart size and mediastinal contours are within normal limits. Decreased lung volumes. Both lungs are clear. The visualized skeletal structures are unremarkable. IMPRESSION: 1. No acute findings. 2. Decreased lung volumes. Electronically Signed   By: Signa Kell M.D.   On: 05/14/2018 19:51     TODAY-DAY OF DISCHARGE:  Subjective:   Steven Holland today has no headache,no chest abdominal pain,no new weakness tingling or  numbness, feels much better wants to go home today.   Objective:   Blood pressure (!) 137/95, pulse 92, temperature 98.7 F (37.1 C), temperature source Oral, resp. rate 18, height 5\' 8"  (1.727 m), weight 95.3 kg, SpO2 97 %.  Intake/Output Summary (Last 24 hours) at 05/17/2018 1013 Last data filed at 05/17/2018 0600 Gross per 24 hour  Intake 1493.73 ml  Output -  Net 1493.73 ml   Filed Weights   05/14/18 1907 05/15/18 1616  Weight: 102.7 kg 95.3 kg    Exam: Awake Alert, Oriented *3, No new F.N deficits, Normal affect [place].AT,PERRAL Supple Neck,No JVD, No cervical lymphadenopathy appriciated.  Symmetrical Chest wall movement, Good air movement bilaterally, CTAB RRR,No Gallops,Rubs or new Murmurs, No Parasternal Heave +ve B.Sounds, Abd Soft, Non tender, No organomegaly appriciated, No rebound -guarding or rigidity. No Cyanosis, Clubbing or edema, No new Rash or bruise   PERTINENT RADIOLOGIC STUDIES: Ct Abdomen Pelvis W Contrast  Result Date: 05/15/2018 CLINICAL DATA:  Severe abdominal pain with nausea and vomiting. Lactate greater than 5. Khalid sepsis. EXAM: CT ABDOMEN AND PELVIS WITH CONTRAST TECHNIQUE: Multidetector CT imaging of the abdomen and pelvis was performed using the standard protocol following bolus administration of intravenous contrast. CONTRAST:  ISOVUE-300 IOPAMIDOL (ISOVUE-300) INJECTION 61% COMPARISON:  None. FINDINGS: Lower chest: Minor lung base atelectasis.  No pleural fluid. Hepatobiliary: Branching air in the left hepatic lobe felt to be portal venous gas rather than pneumobilia. No discrete focal hepatic lesion. Gallbladder physiologically distended, no calcified stone. No biliary dilatation. Pancreas: No ductal dilatation or inflammation. Spleen: Normal in size without focal abnormality. Adrenals/Urinary Tract: Normal adrenal glands. No hydronephrosis or perinephric edema. Homogeneous renal enhancement with symmetric excretion on delayed phase imaging.  Urinary bladder is physiologically distended without wall thickening. Stomach/Bowel: Gaseous gastric distention with gastric pneumatosis about the cardia and antrum with mild associated wall thickening. Small bowel dilated and fluid-filled with mesenteric edema in the lower abdomen. General transition from dilated to nondilated small bowel without discrete transition point. Equivocal pneumatosis involving small bowel in the central abdomen, favored to be intraluminal air, for example image 42 series 3. Fluid/liquid stool in the cecum and ascending colon. No colonic wall thickening or evidence of diverticulitis. Sigmoid colon is tortuous. Appendix not definitively visualized. Vascular/Lymphatic: No mesenteric venous gas. Normal caliber abdominal aorta. Mesenteric vessels are patent. No enlarged abdominal or pelvic lymph nodes. Reproductive: Prostate is unremarkable. Other: Small amount of free fluid in the pericolic gutters and left upper quadrant. Mild mesenteric edema small amount of mesenteric fluid. No free air. Musculoskeletal: There are no acute or suspicious osseous abnormalities. IMPRESSION: 1. Gastric pneumatosis with branching air in the left hepatic lobe suspicious for portal venous gas rather than pneumobilia. 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 2. Fluid-filled dilated proximal small bowel without discrete transition point to suggest obstruction. Small foci of  air within upper abdominal small bowel felt to be intraluminal, however additional foci of pneumatosis are also considered. Findings may be reactive ileus or enteritis. Critical Value/emergent results were called by telephone at the time of interpretation on 05/15/2018 at 12:56 am to [facility] in the Emergency Department, who verbally acknowledged these results. Electronically Signed   By: Narda Rutherford M.D.   On: 05/15/2018 00:58   Dg Chest Port 1 View  Result Date: 05/14/2018 CLINICAL DATA:  Shortness of breath.  Nausea and  vomiting for 1 day. EXAM: PORTABLE CHEST 1 VIEW COMPARISON:  02/20/2018 FINDINGS: The heart size and mediastinal contours are within normal limits. Decreased lung volumes. Both lungs are clear. The visualized skeletal structures are unremarkable. IMPRESSION: 1. No acute findings. 2. Decreased lung volumes. Electronically Signed   By: Signa Kell M.D.   On: 05/14/2018 19:51     PERTINENT LAB RESULTS: CBC: Recent Labs    05/15/18 0421 05/16/18 0357  WBC 16.7* 12.9*  HGB 15.3 13.4  HCT 47.1 39.0  PLT 262 171   CMET CMP     Component Value Date/Time   NA 138 05/16/2018 0357   K 3.5 05/16/2018 0357   CL 107 05/16/2018 0357   CO2 21 (L) 05/16/2018 0357   GLUCOSE 103 (H) 05/16/2018 0357   BUN 8 05/16/2018 0357   CREATININE 0.79 05/16/2018 0357   CREATININE 1.00 07/05/2016 1618   CALCIUM 7.6 (L) 05/16/2018 0357   PROT 5.6 (L) 05/16/2018 0357   ALBUMIN 2.9 (L) 05/16/2018 0357   AST 38 05/16/2018 0357   ALT 15 05/16/2018 0357   ALKPHOS 53 05/16/2018 0357   BILITOT 1.4 (H) 05/16/2018 0357   GFRNONAA >60 05/16/2018 0357   GFRNONAA 89 07/05/2016 1618   GFRAA >60 05/16/2018 0357   GFRAA >89 07/05/2016 1618    GFR Estimated Creatinine Clearance: 126.5 mL/min (by C-G formula based on SCr of 0.79 mg/dL). Recent Labs    05/14/18 2023  LIPASE 29   No results for input(s): CKTOTAL, CKMB, CKMBINDEX, TROPONINI in the last 72 hours. Invalid input(s): POCBNP No results for input(s): DDIMER in the last 72 hours. No results for input(s): HGBA1C in the last 72 hours. No results for input(s): CHOL, HDL, LDLCALC, TRIG, CHOLHDL, LDLDIRECT in the last 72 hours. No results for input(s): TSH, T4TOTAL, T3FREE, THYROIDAB in the last 72 hours.  Invalid input(s): FREET3 No results for input(s): VITAMINB12, FOLATE, FERRITIN, TIBC, IRON, RETICCTPCT in the last 72 hours. Coags: No results for input(s): INR in the last 72 hours.  Invalid input(s): PT Microbiology: Recent Results (from the past  240 hour(s))  Blood Culture (routine x 2)     Status: None (Preliminary result)   Collection Time: 05/14/18  8:15 PM  Result Value Ref Range Status   Specimen Description BLOOD RIGHT ANTECUBITAL  Final   Special Requests   Final    BOTTLES DRAWN AEROBIC AND ANAEROBIC Blood Culture results may not be optimal due to an inadequate volume of blood received in culture bottles   Culture   Final    NO GROWTH 3 DAYS Performed at [facility], [address], [place], [place] [address]    Report Status PENDING  Incomplete  Blood Culture (routine x 2)     Status: None (Preliminary result)   Collection Time: 05/14/18  8:40 PM  Result Value Ref Range Status   Specimen Description BLOOD LEFT HAND  Final   Special Requests   Final    BOTTLES  DRAWN AEROBIC AND ANAEROBIC Blood Culture adequate volume   Culture   Final    NO GROWTH 3 DAYS Performed at [facility], [address] [place], [place] [address]    Report Status PENDING  Incomplete  Urine culture     Status: None   Collection Time: 05/15/18  2:15 AM  Result Value Ref Range Status   Specimen Description URINE, RANDOM  Final   Special Requests   Final    NONE Performed at [facility], [address], [place], [place] [address]    Culture NO GROWTH  Final   Report Status 05/16/2018 FINAL  Final  MRSA PCR Screening     Status: None   Collection Time: 05/15/18  4:06 PM  Result Value Ref Range Status   MRSA by PCR NEGATIVE NEGATIVE Final    Comment:        The GeneXpert MRSA Assay (FDA approved for NASAL specimens only), is one component of a comprehensive MRSA colonization surveillance program. It is not intended to diagnose MRSA infection nor to guide or monitor treatment for MRSA infections. Performed at [facility], [address], [place], [place] [address]     FURTHER DISCHARGE INSTRUCTIONS:  Get Medicines reviewed and adjusted: Please take all your medications with you for your next visit  with your Primary MD  Laboratory/radiological data: Please request your Primary MD to go over all hospital tests and procedure/radiological results at the follow up, please ask your Primary MD to get all Hospital records sent to his/her office.  In some cases, they will be blood work, cultures and biopsy results pending at the time of your discharge. Please request that your primary care M.D. goes through all the records of your hospital data and follows up on these results.  Also Note the following: If you experience worsening of your admission symptoms, develop shortness of breath, life threatening emergency, suicidal or homicidal thoughts you must seek medical attention immediately by calling 911 or calling your MD immediately  if symptoms less severe.  You must read complete instructions/literature along with all the possible adverse reactions/side effects for all the Medicines you take and that have been prescribed to you. Take any new Medicines after you have completely understood and accpet all the possible adverse reactions/side effects.   Do not drive when taking Pain medications or sleeping medications (Benzodaizepines)  Do not take more than prescribed Pain, Sleep and Anxiety Medications. It is not advisable to combine anxiety,sleep and pain medications without talking with your primary care practitioner  Special Instructions: If you have smoked or chewed Tobacco  in the last 2 yrs please stop smoking, stop any regular Alcohol  and or any Recreational drug use.  Wear Seat belts while driving.  Please note: You were cared for by a hospitalist during your hospital stay. Once you are discharged, your primary care physician will handle any further medical issues. Please note that NO REFILLS for any discharge medications will be authorized once you are discharged, as it is imperative that you return to your primary care physician (or establish a relationship with a primary care physician  if you do not have one) for your post hospital discharge needs so that they can reassess your need for medications and monitor your lab values.  Total Time spent coordinating discharge including counseling, education and face to face time equals 35 minutes.  SignedJeoffrey Massed 05/17/2018 10:13 AM

## 2018-05-19 LAB — CULTURE, BLOOD (ROUTINE X 2)
CULTURE: NO GROWTH
Culture: NO GROWTH
Special Requests: ADEQUATE

## 2018-05-21 ENCOUNTER — Encounter: Payer: Self-pay | Admitting: Family Medicine

## 2018-05-21 ENCOUNTER — Ambulatory Visit: Payer: BLUE CROSS/BLUE SHIELD | Admitting: Family Medicine

## 2018-05-21 VITALS — BP 120/76 | HR 105 | Temp 99.2°F | Ht 68.0 in | Wt 217.8 lb

## 2018-05-21 DIAGNOSIS — I10 Essential (primary) hypertension: Secondary | ICD-10-CM | POA: Diagnosis not present

## 2018-05-21 DIAGNOSIS — K589 Irritable bowel syndrome without diarrhea: Secondary | ICD-10-CM | POA: Diagnosis not present

## 2018-05-21 DIAGNOSIS — N179 Acute kidney failure, unspecified: Secondary | ICD-10-CM | POA: Diagnosis not present

## 2018-05-21 DIAGNOSIS — E876 Hypokalemia: Secondary | ICD-10-CM

## 2018-05-21 DIAGNOSIS — K2960 Other gastritis without bleeding: Secondary | ICD-10-CM | POA: Diagnosis not present

## 2018-05-21 LAB — COMPREHENSIVE METABOLIC PANEL
ALBUMIN: 4.3 g/dL (ref 3.5–5.2)
ALK PHOS: 88 U/L (ref 39–117)
ALT: 161 U/L — AB (ref 0–53)
AST: 69 U/L — ABNORMAL HIGH (ref 0–37)
BILIRUBIN TOTAL: 0.3 mg/dL (ref 0.2–1.2)
BUN: 16 mg/dL (ref 6–23)
CO2: 34 mEq/L — ABNORMAL HIGH (ref 19–32)
Calcium: 9.9 mg/dL (ref 8.4–10.5)
Chloride: 99 mEq/L (ref 96–112)
Creatinine, Ser: 0.84 mg/dL (ref 0.40–1.50)
GFR: 103.26 mL/min (ref >60.00)
GLUCOSE: 119 mg/dL — AB (ref 70–99)
Potassium: 4.1 mEq/L (ref 3.5–5.1)
SODIUM: 140 meq/L (ref 135–145)
TOTAL PROTEIN: 7.3 g/dL (ref 6.0–8.3)

## 2018-05-21 LAB — CBC WITH DIFFERENTIAL/PLATELET
BASOS ABS: 0.1 10*3/uL (ref 0.0–0.1)
Basophils Relative: 0.7 % (ref 0.0–3.0)
EOS PCT: 2.1 % (ref 0.0–5.0)
Eosinophils Absolute: 0.2 10*3/uL (ref 0.0–0.7)
HCT: 43.9 % (ref 39.0–52.0)
HEMOGLOBIN: 15 g/dL (ref 13.0–17.0)
LYMPHS ABS: 2 10*3/uL (ref 0.7–4.0)
Lymphocytes Relative: 18.9 % (ref 12.0–46.0)
MCHC: 34.2 g/dL (ref 30.0–36.0)
MCV: 85.9 fl (ref 78.0–100.0)
MONO ABS: 0.9 10*3/uL (ref 0.1–1.0)
Monocytes Relative: 8.6 % (ref 3.0–12.0)
NEUTROS PCT: 69.7 % (ref 43.0–77.0)
Neutro Abs: 7.6 10*3/uL (ref 1.4–7.7)
Platelets: 285 10*3/uL (ref 150.0–400.0)
RBC: 5.11 Mil/uL (ref 4.22–5.81)
RDW: 13.5 % (ref 11.5–15.5)
WBC: 10.9 10*3/uL — AB (ref 4.0–10.5)

## 2018-05-21 NOTE — Patient Instructions (Addendum)
Health Maintenance Due  Topic Date Due  . INFLUENZA VACCINE - declined. If changed mind would want to wait until after antibiotics completed at least.  04/05/2018   Please stop by lab before you go  Please let us know if new or worsening symptoms  Glad you are doing so much better!

## 2018-05-21 NOTE — Assessment & Plan Note (Signed)
Had some low potassium in hospital- update today with labs.

## 2018-05-21 NOTE — Assessment & Plan Note (Signed)
Controlled on hctz 12.5 mg- he started back on Saturday. Slightly dry mucus membranes and if kidneys not doing great may hold for another 5 days while pushes hydration- get bmet to determine.

## 2018-05-21 NOTE — Assessment & Plan Note (Signed)
S: in the hospital, hospitalists mentioned having a good GI doctor given this case along with his IBS, GERD history A/P: will refer today to [facility] per his preference

## 2018-05-21 NOTE — Assessment & Plan Note (Signed)
Noted in hospital- improving by time of discharge - update bmet today. As noted in BP section may hold hctz again depending on results.
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
## 2018-05-21 NOTE — Assessment & Plan Note (Addendum)
Patient in process of healing from this- very impressed with how well he is doing today. He agrees to finish the 2 weeks of augmentin. HR remains slightly high but his mucus membranes are dry and encouraged increased hydration plus at baseline his HR is high normal.   FMLA paperwork filled out for patient and wife today.

## 2018-05-22 ENCOUNTER — Telehealth: Payer: Self-pay | Admitting: Family Medicine

## 2018-05-22 NOTE — Telephone Encounter (Signed)
See result notes. 

## 2018-05-22 NOTE — Telephone Encounter (Signed)
Copied from CRM [id number]. Topic: Quick Communication - Lab Results >> May 22, 2018  3:59 PM Sherrye Payorrummond, Cassandra J, RN wrote: Called patient to inform them of 05/21/18 lab results. When patient returns call, triage nurse may disclose results.

## 2018-05-25 ENCOUNTER — Encounter: Payer: Self-pay | Admitting: Gastroenterology

## 2018-05-25 ENCOUNTER — Other Ambulatory Visit: Payer: Self-pay

## 2018-05-25 DIAGNOSIS — R945 Abnormal results of liver function studies: Principal | ICD-10-CM

## 2018-05-25 DIAGNOSIS — R7989 Other specified abnormal findings of blood chemistry: Secondary | ICD-10-CM

## 2018-07-02 ENCOUNTER — Ambulatory Visit: Payer: BLUE CROSS/BLUE SHIELD | Admitting: Gastroenterology

## 2018-07-11 ENCOUNTER — Other Ambulatory Visit: Payer: Self-pay | Admitting: Neurology

## 2018-07-23 ENCOUNTER — Other Ambulatory Visit: Payer: Self-pay | Admitting: Family Medicine

## 2018-07-31 ENCOUNTER — Ambulatory Visit: Payer: BLUE CROSS/BLUE SHIELD | Admitting: Neurology

## 2018-07-31 ENCOUNTER — Encounter: Payer: Self-pay | Admitting: Neurology

## 2018-07-31 VITALS — BP 128/92 | HR 120 | Ht 68.0 in | Wt 224.0 lb

## 2018-07-31 DIAGNOSIS — G43709 Chronic migraine without aura, not intractable, without status migrainosus: Secondary | ICD-10-CM | POA: Diagnosis not present

## 2018-07-31 MED ORDER — NORTRIPTYLINE HCL 50 MG PO CAPS: 50.0000 mg | ORAL_CAPSULE | Freq: Every day | ORAL | 3 refills | Status: DC

## 2018-07-31 MED ORDER — SUMATRIPTAN SUCCINATE 3 MG/0.5ML ~~LOC~~ SOAJ: 3.0000 mg | SUBCUTANEOUS | 0 refills | Status: DC

## 2018-07-31 NOTE — Patient Instructions (Signed)
1.  We will increase nortriptyline to 50mg  at bedtime.  If headaches not significantly improved in 4 to 6 weeks, contact me and we can increase dose. 2.  Continue Aimovig 140mg  monthly 3.  At earliest onset of migraine, use Zembrace injection.  May repeat in one hour if needed, no more than 2 shots in 24 hours.  If effective, then contact us and we can send in a prescription 4.  Use Fioricet with codeine sparingly 5.  Limit use of pain relievers to no more than 2 days out of week to prevent risk of rebound or medication-overuse headache. 6.  Continue headache diary 7.  Follow up in 3 to 4 months.

## 2018-07-31 NOTE — Progress Notes (Signed)
NEUROLOGY FOLLOW UP OFFICE NOTE  Dewayne HatchDarrell A Abid [id number]  HISTORY OF PRESENT ILLNESS: Steven Holland is a 47 year old man who follows up for migraines.  UPDATE: He was admitted to the hospital in September for sepsis secondary to emphysematous gastritis.  He was treated with antibiotics. Intensity:  Usually up to 4/10 (twice a month they are 6-7/10) Duration:  1-1.5 hours when Sprix effective.  2 hours with Fioricet with codeine Frequency: 16 days over past month (2 severe migraines a month) Frequency of abortive medication: Fioricet with codeine 2 to 3 days a week. Current NSAIDS:  Sprix NS.  It was 50% effective but it burns. Current analgesics:  Fioricet with codeine Current triptans:  none Current ergotamine: None Current anti-emetic: Zofran 8 mg Current muscle relaxants: None Current anti-anxiolytic: None Current sleep aide: None Current Antihypertensive medications: Hydrochlorothiazide Current Antidepressant medications: Nortriptyline 25 mg at bedtime Current Anticonvulsant medications: None Current anti-CGRP:  Aimovig 140 mg monthly Current Vitamins/Herbal/Supplements: None Current Antihistamines/Decongestants: None Other therapy: Chiropractic  Caffeine: 1 or less Cokes per day Alcohol: Rarely Smoker: No Diet: Drinks over 216 ounce bottles of water daily Exercise: Rides an elliptical about 3 times a week Depression: No; Anxiety: No Other pain:  no Sleep hygiene: 5 hours a night  HISTORY:  Onset: Since early 20s. Location:  Back of head/neck or bi-temporal.  Quality:  pounding Initial intensity:  Varies from 2-3/10 to 5-6/10, rarely 7-8/10 Aura:  no Prodrome:  no Postdrome:  no Associated symptoms: Nausea, photophobia, phonophobia.  He denies thunderclap headache. Initial Duration:  All day or days if not pain reliever. Otherwise, 2 to 4 hours.  Sometimes wakes up with headache Initial Frequency:  4-5 days a week. Initial Frequency of abortive  medication: 4-5 days a week Triggers: Change of barometric pressure, allergies Relieving factors:  no Activity:  aggravates  Past NSAIDS:  ibuprofen Past analgesics:  Tylenol, vicodin (worked), Geophysicist/field seismologistioricet with Codeine, Excedrin Migraine Past abortive triptans:  Sumatriptan tablet, Maxalt, Relpax Past ergots:  Migranal Past muscle relaxants:  no Past anti-emetic:  no Past antihypertensive medications:  bisoprolol Past antidepressant medications:  no Past anticonvulsant medications:  topiramate 100mg  (paresthesias, impotence) Past vitamins/Herbal/Supplements:  no Other past therapies:  no  Family history of headache:  Mother's side  PAST MEDICAL HISTORY: Past Medical History:  Diagnosis Date  . Chicken pox   . Hypertension   . Low testosterone   . Syncope    related to IBS and severe nausea    MEDICATIONS: Current Outpatient Medications on File Prior to Visit  Medication Sig Dispense Refill  . amoxicillin-clavulanate (AUGMENTIN) 875-125 MG tablet Take 1 tablet by mouth every 12 (twelve) hours. 27 tablet 0  . butalbital-acetaminophen-caffeine (FIORICET WITH CODEINE) 50-325-40-30 MG capsule Take 1 capsule by mouth every 6 (six) hours as needed (for migraines).   3  . Erenumab-aooe (AIMOVIG 140 DOSE) 70 MG/ML SOAJ Inject 140 mg into the skin every 30 (thirty) days. 2 pen 11  . folic acid (FOLVITE) 400 MCG tablet Take 400 mcg by mouth daily.    . hydrochlorothiazide (MICROZIDE) 12.5 MG capsule Take 1 capsule (12.5 mg total) by mouth daily. (Patient taking differently: Take 12.5 mg by mouth at bedtime. ) 90 capsule 3  . Ketorolac Tromethamine (SPRIX) 15.75 MG/SPRAY SOLN Place 1 spray into both nostrils once as needed (for migraines).     . Magnesium 250 MG TABS Take 250 mg by mouth daily.     . Multiple Vitamin (MULITIVITAMIN WITH MINERALS) TABS Take 1  tablet by mouth daily.    . naproxen sodium (ALEVE) 220 MG tablet Take 220-440 mg by mouth 2 (two) times daily as needed (for pain).     . nortriptyline (PAMELOR) 25 MG capsule TAKE 1 CAPSULE (25 MG TOTAL) BY MOUTH AT BEDTIME. 90 capsule 2  . omeprazole (PRILOSEC) 20 MG capsule TAKE 1 CAPSULE (20 MG TOTAL) BY MOUTH DAILY. 90 capsule 2  . ondansetron (ZOFRAN) 8 MG tablet TAKE 1 TABLET (8 MG TOTAL) BY MOUTH EVERY 8 (EIGHT) HOURS AS NEEDED. (Patient taking differently: Take 8 mg by mouth every 8 (eight) hours as needed for nausea or vomiting. ) 9 tablet 11  . Potassium Bicarbonate 99 MG CAPS Take 1 capsule by mouth daily.    . Tadalafil 2.5 MG TABS TAKE 1 TABLET DAILY 90 tablet 2  . Testosterone 20.25 MG/ACT (1.62%) GEL APPLY 4 PUMPS TOPICALLY DAILY. 450 g 5  . vitamin B-12 (CYANOCOBALAMIN) 1000 MCG tablet Take 1,000 mcg by mouth daily.     No current facility-administered medications on file prior to visit.     ALLERGIES: Allergies  Allergen Reactions  . Sulfa Antibiotics Swelling    Eyes became swollen and ear became swollen (localized)  . Milk-Related Compounds Other (See Comments)    Intense abdominal pain and passing out if he consumes over 4 ounces    FAMILY HISTORY: Family History  Problem Relation Age of Onset  . Arthritis Mother        knee replacements and back  . Heart disease Father        30s reportedly- patient no issues  . Benign prostatic hyperplasia Father   . Other Brother        hypogonadism/low T both brothers  . Migraines Brother        bothbrothers  . Prostate cancer Maternal Grandfather   . Diabetes Paternal Grandmother    SOCIAL HISTORY: Social History   Socioeconomic History  . Marital status: Married    Spouse name: Not on file  . Number of children: Not on file  . Years of education: Not on file  . Highest education level: Not on file  Occupational History  . Not on file  Social Needs  . Financial resource strain: Not on file  . Food insecurity:    Worry: Not on file    Inability: Not on file  . Transportation needs:    Medical: Not on file    Non-medical: Not on file    Tobacco Use  . Smoking status: Never Smoker  . Smokeless tobacco: Never Used  Substance and Sexual Activity  . Alcohol use: Yes    Comment: rare  . Drug use: No  . Sexual activity: Yes  Lifestyle  . Physical activity:    Days per week: Not on file    Minutes per session: Not on file  . Stress: Not on file  Relationships  . Social connections:    Talks on phone: Not on file    Gets together: Not on file    Attends religious service: Not on file    Active member of club or organization: Not on file    Attends meetings of clubs or organizations: Not on file    Relationship status: Not on file  . Intimate partner violence:    Fear of current or ex partner: Not on file    Emotionally abused: Not on file    Physically abused: Not on file    Forced sexual activity: Not on  file  Other Topics Concern  . Not on file  Social History Narrative   Married wife [name]. No children.       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 Very busy at work. Enjoys Psychologist, educational. Household projects and Holiday representative.     REVIEW OF SYSTEMS: Constitutional: No fevers, chills, or sweats, no generalized fatigue, change in appetite Eyes: No visual changes, double vision, eye pain Ear, nose and throat: No hearing loss, ear pain, nasal congestion, sore throat Cardiovascular: No chest pain, palpitations Respiratory:  No shortness of breath at rest or with exertion, wheezes GastrointestinaI: No nausea, vomiting, diarrhea, abdominal pain, fecal incontinence Genitourinary:  No dysuria, urinary retention or frequency Musculoskeletal:  No neck pain, back pain Integumentary: No rash, pruritus, skin lesions Neurological: as above Psychiatric: No depression, insomnia, anxiety Endocrine: No palpitations, fatigue, diaphoresis, mood swings, change in appetite, change in weight, increased thirst Hematologic/Lymphatic:  No purpura, petechiae. Allergic/Immunologic: no itchy/runny eyes,  nasal congestion, recent allergic reactions, rashes  PHYSICAL EXAM: Blood pressure (!) 128/92, pulse (!) 120, height 5\' 8"  (1.727 m), weight 224 lb (101.6 kg), SpO2 94 %. General: No acute distress.  Patient appears well-groomed.   Head:  Normocephalic/atraumatic Eyes:  Fundi examined but not visualized Neck: supple, no paraspinal tenderness, full range of motion Heart:  Regular rate and rhythm Lungs:  Clear to auscultation bilaterally Back: No paraspinal tenderness Neurological Exam: alert and oriented to person, place, and time. Attention span and concentration intact, recent and remote memory intact, fund of knowledge intact.  Speech fluent and not dysarthric, language intact.  CN II-XII intact. Bulk and tone normal, muscle strength 5/5 throughout.  Sensation to light touch  intact.  Deep tendon reflexes 2+ throughout.  Finger to nose testing intact.  Gait normal, Romberg negative.  IMPRESSION: Chronic migraine without aura, without status migrainosus, not intractable  PLAN: 1.  We will increase nortriptyline to 50 mg at bedtime.  If headaches not significantly improved in 4 to 6 weeks, we can increase dose to 100 mg at bedtime. 2.  Continue Aimovig 140 mg monthly 3.  He will stop the Sprix nasal spray.  At earliest onset of migraine, he will try Zembrace sumatriptan injection.  He will continue to use Fioricet with codeine sparingly. 4.  Limit use of pain relievers to no more than 2 days out of the week to prevent risk of rebound headache. 5.  Continue headache diary 6.  Follow-up in 3 to 4 months.  Shon Millet, DO  CC: Tana Conch, MD

## 2018-08-02 ENCOUNTER — Other Ambulatory Visit: Payer: Self-pay | Admitting: Neurology

## 2018-08-09 ENCOUNTER — Other Ambulatory Visit: Payer: Self-pay | Admitting: Neurology

## 2018-08-22 ENCOUNTER — Encounter: Payer: Self-pay | Admitting: Gastroenterology

## 2018-08-28 ENCOUNTER — Other Ambulatory Visit: Payer: Self-pay

## 2018-08-28 DIAGNOSIS — G44211 Episodic tension-type headache, intractable: Secondary | ICD-10-CM

## 2018-08-28 MED ORDER — ONDANSETRON HCL 8 MG PO TABS: ORAL_TABLET | ORAL | 11 refills | Status: DC

## 2018-09-18 ENCOUNTER — Ambulatory Visit: Payer: BLUE CROSS/BLUE SHIELD | Admitting: Gastroenterology

## 2018-09-18 ENCOUNTER — Other Ambulatory Visit (INDEPENDENT_AMBULATORY_CARE_PROVIDER_SITE_OTHER): Payer: BLUE CROSS/BLUE SHIELD

## 2018-09-18 ENCOUNTER — Encounter: Payer: Self-pay | Admitting: Gastroenterology

## 2018-09-18 VITALS — BP 130/70 | HR 84 | Ht 68.0 in | Wt 222.0 lb

## 2018-09-18 DIAGNOSIS — R748 Abnormal levels of other serum enzymes: Secondary | ICD-10-CM

## 2018-09-18 DIAGNOSIS — K2960 Other gastritis without bleeding: Secondary | ICD-10-CM

## 2018-09-18 LAB — HEPATIC FUNCTION PANEL
ALK PHOS: 82 U/L (ref 39–117)
ALT: 28 U/L (ref 0–53)
AST: 21 U/L (ref 0–37)
Albumin: 4.6 g/dL (ref 3.5–5.2)
Bilirubin, Direct: 0.1 mg/dL (ref 0.0–0.3)
Total Bilirubin: 0.3 mg/dL (ref 0.2–1.2)
Total Protein: 8.1 g/dL (ref 6.0–8.3)

## 2018-09-18 NOTE — Patient Instructions (Signed)
Your provider has requested that you go to the basement level for lab work before leaving today. Press "B" on the elevator. The lab is located at the first door on the left as you exit the elevator.  It has been recommended to you by your physician that you have an endoscopy completed. Per your request, we did not schedule the procedure today. Please contact our office at [phone] should you decide to have the procedure completed.

## 2018-09-18 NOTE — Progress Notes (Addendum)
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  No prior colon cancer or polyps No known family history of colon cancer or polyps.   EGD recommended given the recent diagnosis of emphysematous gastritis. Colonoscopy recommended for colon cancer screening.   Abnormal liver enzymes likely had a multifactorial etiology with sepsis, infection, and antibiotics. Will repeat liver enzymes today to confirm resolution.   PLAN: Hepatic function panel EGD to follow-up on his recent abnormal CT scan Screening colonoscopy at his convenience  I consented the patient at the bedside today discussing the risks, benefits, and alternatives to endoscopic evaluation. In particular, we discussed the risks that include, but are not limited to, reaction to medication, cardiopulmonary compromise, bleeding requiring blood transfusion, aspiration resulting in pneumonia, perforation requiring surgery, lack of diagnosis, severe illness requiring hospitalization, and even death. We reviewed the risk of missed lesion including polyps or even cancer. The patient acknowledges these risks and asks that we proceed.   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  The history is obtained through the patient and review of his electronic health record.  No family was present at the time of my evaluation.  Hospitalized September 2019 for sepsis secondary to emphysematous gastritis that was thought to be due to an infectious etiology.  He was followed by surgery and treated  with 2 weeks of Augmentin.  Symptoms have resolved and he currently feels back to normal.  CT of the abdomen and pelvis with contrast 9/10/9 showed branching air in the left hepatic lobe felt to be portal venous gas.  No focal hepatic lesions seen.  No biliary dilatation.  Gaseous gastric distention with gastric pneumatosis about the cardia and antrum with mild associated wall thickening small bowel dilatation and fluid-filled with mesenteric edema in the lower abdomen.  No follow-up imaging has been performed.  No known family history of colon cancer or polyps.  No prior history of endoscopic evaluation.  Abdominal cramping with drinking more than 4 ounces of milk. LLQ location of cramping.  GI review of systems is otherwise negative.  His liver enzymes were noted to be elevated while hospitalized.  The last labs available to me are from 05/21/2018 and show an AST of 69, ALT 161, alk phos 88, total bilirubin 0.3.  Labs from 05/14/2018 show an AST of 22, ALT 25, alk phos 98, total bilirubin 0.7.  He had no prior knowledge of abnormal liver enzymes.  HIV testing was negative.   Past Medical History:  Diagnosis Date  . Chicken pox   . Chronic headaches   . Hyperlipidemia   . Hypertension   . IBS (irritable bowel syndrome)   . Low testosterone   . Obesity   . Syncope    related to IBS and severe nausea    Past Surgical History:  Procedure Laterality Date  . NO PAST SURGERIES      Current Outpatient Medications  Medication Sig Dispense Refill  . butalbital-acetaminophen-caffeine (FIORICET WITH CODEINE) 50-325-40-30 MG capsule TAKE ONE CAPSULE BY MOUTH EVERY  6 HOURS AS NEEDED 15 capsule 3  . Erenumab-aooe (AIMOVIG 140 DOSE) 70 MG/ML SOAJ Inject 140 mg into the skin every 30 (thirty) days. 2 pen 11  . folic acid (FOLVITE) 979 MCG tablet Take 400 mcg by mouth daily.    . hydrochlorothiazide (MICROZIDE) 12.5 MG capsule Take 1 capsule (12.5 mg total) by mouth daily. (Patient taking differently:  Take 12.5 mg by mouth at bedtime. ) 90 capsule 3  . Magnesium 250 MG TABS Take 250 mg by mouth daily.     . Multiple Vitamin (MULITIVITAMIN WITH MINERALS) TABS Take 1 tablet by mouth daily.    . nortriptyline (PAMELOR) 50 MG capsule TAKE 1 CAPSULE (50 MG TOTAL) BY MOUTH AT BEDTIME. 90 capsule 2  . omeprazole (PRILOSEC) 20 MG capsule TAKE 1 CAPSULE (20 MG TOTAL) BY MOUTH DAILY. 90 capsule 2  . ondansetron (ZOFRAN) 8 MG tablet TAKE 1 TABLET (8 MG TOTAL) BY MOUTH EVERY 8 (EIGHT) HOURS AS NEEDED. 9 tablet 11  . Potassium Bicarbonate 99 MG CAPS Take 1 capsule by mouth daily.    . SUMAtriptan Succinate (ZEMBRACE SYMTOUCH) 3 MG/0.5ML SOAJ Inject 3 mg into the skin as directed. May repeat in 1 hour as needed. No more than 2/24 2 pen 0  . Tadalafil 2.5 MG TABS TAKE 1 TABLET DAILY 90 tablet 2  . Testosterone 20.25 MG/ACT (1.62%) GEL APPLY 4 PUMPS TOPICALLY DAILY. 450 g 5  . vitamin B-12 (CYANOCOBALAMIN) 1000 MCG tablet Take 1,000 mcg by mouth daily.     No current facility-administered medications for this visit.     Allergies as of 09/18/2018 - Review Complete 09/18/2018  Allergen Reaction Noted  . Sulfa antibiotics Swelling 11/09/2011  . Milk-related compounds Other (See Comments) 05/15/2018    Family History  Problem Relation Age of Onset  . Arthritis Mother        knee replacements and back  . Heart disease Father        3s reportedly- patient no issues  . Benign prostatic hyperplasia Father   . Other Brother        hypogonadism/low T both brothers  . Migraines Brother        bothbrothers  . Prostate cancer Maternal Grandfather   . Diabetes Paternal Grandmother     Social History   Socioeconomic History  . Marital status: Married    Spouse name: Not on file  . Number of children: 0  . Years of education: Not on file  . Highest education level: Not on file  Occupational History  . Occupation: Engineer, mining  Social Needs  . Financial resource strain: Not on file   . Food insecurity:    Worry: Not on file    Inability: Not on file  . Transportation needs:    Medical: Not on file    Non-medical: Not on file  Tobacco Use  . Smoking status: Never Smoker  . Smokeless tobacco: Never Used  Substance and Sexual Activity  . Alcohol use: Yes    Comment: rare  . Drug use: No  . Sexual activity: Yes  Lifestyle  . Physical activity:    Days per week: Not on file    Minutes per session: Not on file  . Stress: Not on file  Relationships  . Social connections:    Talks on phone: Not on file    Gets together: Not on file    Attends religious service: Not on file    Active member of club or  organization: Not on file    Attends meetings of clubs or organizations: Not on file    Relationship status: Not on file  . Intimate partner violence:    Fear of current or ex partner: Not on file    Emotionally abused: Not on file    Physically abused: Not on file    Forced sexual activity: Not on file  Other Topics Concern  . Not on file  Social History Narrative   Married wife Hilda Blades. No children.       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 Very busy at work. Enjoys Engineer, site. Household projects and Architect.     Review of Systems: 12 system ROS is negative except as noted above with the additions of headaches, hearing problems, epistaxis, and shortness of breath.  Filed Weights   09/18/18 1430  Weight: 222 lb (100.7 kg)    Physical Exam: Vital signs were reviewed. General:   Alert, well-nourished, pleasant and cooperative in NAD Head:  Normocephalic and atraumatic. Eyes:  Sclera clear, no icterus.   Conjunctiva pink. Mouth:  No deformity or lesions.   Neck:  Supple; no thyromegaly. Lungs:  Clear throughout to auscultation.   No wheezes.  Heart:  Regular rate and rhythm; no murmurs Abdomen:  Soft, nontender, normal bowel sounds. No rebound or guarding. No hepatosplenomegaly Rectal:  Deferred  Msk:   Symmetrical without gross deformities. Extremities:  No gross deformities or edema. Neurologic:  Alert and  oriented x4;  grossly nonfocal Skin:  No rash or bruise. Psych:  Alert and cooperative. Normal mood and affect.   Luby Seamans L. Tarri Glenn, MD, MPH [facility] 09/18/2018, 2:44 PM

## 2018-09-25 NOTE — Progress Notes (Signed)
My Chart message sent

## 2018-12-04 ENCOUNTER — Other Ambulatory Visit: Payer: Self-pay | Admitting: Family Medicine

## 2018-12-04 DIAGNOSIS — I10 Essential (primary) hypertension: Secondary | ICD-10-CM

## 2018-12-12 ENCOUNTER — Ambulatory Visit: Payer: BLUE CROSS/BLUE SHIELD | Admitting: Neurology

## 2018-12-18 ENCOUNTER — Other Ambulatory Visit: Payer: Self-pay | Admitting: Neurology

## 2019-01-01 ENCOUNTER — Other Ambulatory Visit: Payer: Self-pay | Admitting: Neurology

## 2019-01-01 DIAGNOSIS — G43709 Chronic migraine without aura, not intractable, without status migrainosus: Secondary | ICD-10-CM

## 2019-01-24 ENCOUNTER — Other Ambulatory Visit: Payer: Self-pay | Admitting: Neurology

## 2019-02-09 ENCOUNTER — Other Ambulatory Visit: Payer: Self-pay | Admitting: Neurology

## 2019-02-26 ENCOUNTER — Other Ambulatory Visit: Payer: Self-pay | Admitting: Family Medicine

## 2019-02-26 DIAGNOSIS — I10 Essential (primary) hypertension: Secondary | ICD-10-CM

## 2019-03-20 ENCOUNTER — Telehealth: Payer: Self-pay

## 2019-03-20 NOTE — Telephone Encounter (Signed)
PA request for Tadalafil 2.5 mg tabs, take 1 tablet daily, #90/2

## 2019-03-21 NOTE — Telephone Encounter (Signed)
PA form faxed to [facility].

## 2019-03-22 NOTE — Telephone Encounter (Signed)
Approved 02/19/19-03/20/20

## 2019-03-29 ENCOUNTER — Other Ambulatory Visit: Payer: Self-pay | Admitting: Family Medicine

## 2019-03-29 DIAGNOSIS — I10 Essential (primary) hypertension: Secondary | ICD-10-CM

## 2019-04-09 NOTE — Progress Notes (Signed)
Initiated on Cover My Meds Key: [id number]  Aimovig 140 mg approved.  GEZMOQ:[id number];KPTWSF:[id number];Review Type:Prior Auth;Coverage Start Date:03/10/2019;Coverage End Date:04/08/2020  Sending Pt approval information via My Chart

## 2019-04-22 ENCOUNTER — Ambulatory Visit: Payer: BLUE CROSS/BLUE SHIELD | Admitting: Family Medicine

## 2019-04-30 ENCOUNTER — Other Ambulatory Visit: Payer: Self-pay | Admitting: Neurology

## 2019-05-06 ENCOUNTER — Encounter: Payer: Self-pay | Admitting: Family Medicine

## 2019-05-06 ENCOUNTER — Ambulatory Visit (INDEPENDENT_AMBULATORY_CARE_PROVIDER_SITE_OTHER): Payer: BC Managed Care – PPO | Admitting: Family Medicine

## 2019-05-06 ENCOUNTER — Other Ambulatory Visit: Payer: Self-pay

## 2019-05-06 VITALS — BP 142/94 | HR 94 | Temp 98.0°F | Ht 68.0 in | Wt 227.2 lb

## 2019-05-06 DIAGNOSIS — J358 Other chronic diseases of tonsils and adenoids: Secondary | ICD-10-CM | POA: Diagnosis not present

## 2019-05-06 DIAGNOSIS — E785 Hyperlipidemia, unspecified: Secondary | ICD-10-CM | POA: Diagnosis not present

## 2019-05-06 DIAGNOSIS — I10 Essential (primary) hypertension: Secondary | ICD-10-CM | POA: Diagnosis not present

## 2019-05-06 DIAGNOSIS — E291 Testicular hypofunction: Secondary | ICD-10-CM

## 2019-05-06 DIAGNOSIS — R739 Hyperglycemia, unspecified: Secondary | ICD-10-CM | POA: Diagnosis not present

## 2019-05-06 DIAGNOSIS — Z79899 Other long term (current) drug therapy: Secondary | ICD-10-CM

## 2019-05-06 DIAGNOSIS — K219 Gastro-esophageal reflux disease without esophagitis: Secondary | ICD-10-CM

## 2019-05-06 DIAGNOSIS — E669 Obesity, unspecified: Secondary | ICD-10-CM

## 2019-05-06 DIAGNOSIS — G44211 Episodic tension-type headache, intractable: Secondary | ICD-10-CM

## 2019-05-06 LAB — CBC
HCT: 42.8 % (ref 39.0–52.0)
Hemoglobin: 14.5 g/dL (ref 13.0–17.0)
MCHC: 33.8 g/dL (ref 30.0–36.0)
MCV: 88 fl (ref 78.0–100.0)
Platelets: 231 10*3/uL (ref 150.0–400.0)
RBC: 4.86 Mil/uL (ref 4.22–5.81)
RDW: 13.4 % (ref 11.5–15.5)
WBC: 8.1 10*3/uL (ref 4.0–10.5)

## 2019-05-06 LAB — COMPREHENSIVE METABOLIC PANEL
ALT: 33 U/L (ref 0–53)
AST: 22 U/L (ref 0–37)
Albumin: 4.3 g/dL (ref 3.5–5.2)
Alkaline Phosphatase: 83 U/L (ref 39–117)
BUN: 18 mg/dL (ref 6–23)
CO2: 32 mEq/L (ref 19–32)
Calcium: 9.6 mg/dL (ref 8.4–10.5)
Chloride: 102 mEq/L (ref 96–112)
Creatinine, Ser: 0.95 mg/dL (ref 0.40–1.50)
GFR: 83.96 mL/min (ref >60.00)
Glucose, Bld: 103 mg/dL — ABNORMAL HIGH (ref 70–99)
Potassium: 4.2 mEq/L (ref 3.5–5.1)
Sodium: 144 mEq/L (ref 135–145)
Total Bilirubin: 0.3 mg/dL (ref 0.2–1.2)
Total Protein: 7 g/dL (ref 6.0–8.3)

## 2019-05-06 LAB — LIPID PANEL
Cholesterol: 185 mg/dL (ref 0–200)
HDL: 38.4 mg/dL — ABNORMAL LOW (ref >39.00)
NonHDL: 146.39
Total CHOL/HDL Ratio: 5
Triglycerides: 331 mg/dL — ABNORMAL HIGH (ref 0.0–149.0)
VLDL: 66.2 mg/dL — ABNORMAL HIGH (ref 0.0–40.0)

## 2019-05-06 LAB — VITAMIN B12: Vitamin B-12: >1500 pg/mL — ABNORMAL HIGH (ref 211–911)

## 2019-05-06 LAB — PSA: PSA: 0.78 ng/mL (ref 0.10–4.00)

## 2019-05-06 LAB — LDL CHOLESTEROL, DIRECT: Direct LDL: 104 mg/dL

## 2019-05-06 LAB — TESTOSTERONE: Testosterone: 208.7 ng/dL — ABNORMAL LOW (ref 300.00–890.00)

## 2019-05-06 LAB — HEMOGLOBIN A1C: Hgb A1c MFr Bld: 5.7 % (ref 4.6–6.5)

## 2019-05-06 MED ORDER — OMEPRAZOLE 20 MG PO CPDR: 20.0000 mg | DELAYED_RELEASE_CAPSULE | Freq: Every day | ORAL | 3 refills | Status: DC

## 2019-05-06 MED ORDER — ONDANSETRON HCL 8 MG PO TABS: ORAL_TABLET | ORAL | 11 refills | Status: DC

## 2019-05-06 MED ORDER — DICYCLOMINE HCL 10 MG PO CAPS: 10.0000 mg | ORAL_CAPSULE | Freq: Three times a day (TID) | ORAL | 2 refills | Status: DC

## 2019-05-06 MED ORDER — TESTOSTERONE 20.25 MG/ACT (1.62%) TD GEL: TRANSDERMAL | 5 refills | Status: DC

## 2019-05-06 MED ORDER — TADALAFIL 2.5 MG PO TABS: ORAL_TABLET | ORAL | 3 refills | Status: DC

## 2019-05-06 NOTE — Patient Instructions (Addendum)
Health Maintenance Due  Topic Date Due  . INFLUENZA VACCINE - declines 04/06/2019   Monitor blood pressure every day or every other day for next 2 weeks and update me by mychart. If averaging over 140/90 we will make medicine adjustments. Try to target 150 minutes a week exercise, continue efforts for low salt low processed food diet that is also rich in fruits and vegetables.   Please stop by lab before you go If you do not have mychart- we will call you about results within 5 business days of [place] receiving them.  If you have mychart- we will send your results within 3 business days of [place] receiving them.  If abnormal or we want to clarify a result, we will call or mychart you to make sure you receive the message.  If you have questions or concerns or don't hear within 5-7 days, please send [place] a message or call us.

## 2019-05-06 NOTE — Progress Notes (Signed)
Phone [phone]   Subjective:  Steven Holland is a 48 y.o. year old very pleasant male patient who presents for/with See problem oriented charting Chief Complaint  Patient presents with  . Follow-up    Fasting today.   . Hypertension  . Migraine  . Erectile Dysfunction   ROS-  Denies CP, SOB, visual changes. No edema   Past Medical History-  Patient Active Problem List   Diagnosis Date Noted  . 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 Hyperglycemia 05/09/2017    Priority: Medium  . Hyperlipidemia 11/04/2016    Priority: Medium  . Hypogonadism male 12/18/2011    Priority: Medium  . HTN (hypertension) 12/18/2011    Priority: Medium  . Migraine headache with aura 12/18/2011    Priority: Medium  . GERD (gastroesophageal reflux disease) 12/18/2011    Priority: Medium  . Irritable bowel syndrome (IBS) 12/18/2011    Priority: Medium  . Obesity, Class I, BMI 30-34.9 10/22/2015    Priority: Low  . ED (erectile dysfunction) 12/18/2011    Priority: Low  . AR (allergic rhinitis) 12/18/2011    Priority: Low  . Tonsillith 05/06/2019  . AKI (acute kidney injury) ([place]) 05/15/2018    Medications- reviewed and updated Current Outpatient Medications  Medication Sig Dispense Refill  . AIMOVIG 140 MG/ML SOAJ INJECT 140 MG INTO THE SKIN EVERY 30 (THIRTY) DAYS. 1 pen 11  . BELLADONNA ALK-PHENOBARBITAL PO Take by mouth. Take 1 tablet by mouth before meals and at bedtime prn for abd cramping.    . butalbital-acetaminophen-caffeine (FIORICET WITH CODEINE) 50-325-40-30 MG capsule TAKE 1 CAPSULE BY MOUTH EVERY 6 HOURS AS NEEDED 15 capsule 3  . folic acid (FOLVITE) 025 MCG tablet Take 400 mcg by mouth daily.    . hydrochlorothiazide (MICROZIDE) 12.5 MG capsule TAKE 1 CAPSULE BY MOUTH EVERY DAY 90 capsule 0  . Magnesium 250 MG TABS Take 250 mg by mouth daily.     . Multiple Vitamin (MULITIVITAMIN WITH MINERALS) TABS Take 1 tablet by mouth daily.    . nortriptyline  (PAMELOR) 50 MG capsule TAKE 1 CAPSULE (50 MG TOTAL) BY MOUTH AT BEDTIME. 90 capsule 0  . omeprazole (PRILOSEC) 20 MG capsule Take 1 capsule (20 mg total) by mouth daily. 90 capsule 3  . ondansetron (ZOFRAN) 8 MG tablet TAKE 1 TABLET (8 MG TOTAL) BY MOUTH EVERY 8 (EIGHT) HOURS AS NEEDED. 9 tablet 11  . Potassium Bicarbonate 99 MG CAPS Take 1 capsule by mouth daily.    . SUMAtriptan Succinate (ZEMBRACE SYMTOUCH) 3 MG/0.5ML SOAJ Inject 3 mg into the skin as directed. May repeat in 1 hour as needed. No more than 2/24 2 pen 0  . Tadalafil 2.5 MG TABS TAKE 1 TABLET DAILY 90 tablet 3  . Testosterone 20.25 MG/ACT (1.62%) GEL APPLY 4 PUMPS TOPICALLY DAILY. 450 g 5  . vitamin B-12 (CYANOCOBALAMIN) 1000 MCG tablet Take 1,000 mcg by mouth daily.    Marland Kitchen dicyclomine (BENTYL) 10 MG capsule Take 1 capsule (10 mg total) by mouth 4 (four) times daily -  before meals and at bedtime. 60 capsule 2   No current facility-administered medications for this visit.      Objective:  BP (!) 142/94 (BP Location: Left Arm, Patient Position: Sitting, Cuff Size: Large)   Pulse 94   Temp 98 F (36.7 C) (Oral)   Ht '5\' 8"'  (1.727 m)   Wt 227 lb 3.2 oz (103.1 kg)   SpO2 96%   BMI 34.55  kg/m  Gen: NAD, resting comfortably CV: RRR no murmurs rubs or gallops Lungs: CTAB no crackles, wheeze, rhonchi Abdomen: soft/nontender/nondistended/normal bowel sounds.  Ext: no edema Skin: warm, dry     Assessment and Plan   #hypertension/obesity S:  Poorly controlled on HCTZ 12.5 mg daily on initial check. Not checking BP at home. Reports HA and dizziness, hx of migraine. Not watching salt intake, but rarely adds salt to food. Eats out more than cooking at home. Not exercising regularly.   Obesity-has gained a few pounds with COVID-19 BP Readings from Last 3 Encounters:  05/06/19 (!) 142/94  09/18/18 130/70  07/31/18 (!) 128/92  A/P: poor control today- no recent home checks- he wants to do some home monitoring before  considering increasing medication-he will update me via my chart in 2 weeks- if not averaging less than 140/90 consider increasing Rx.    For obesity-we discussed importance of healthy lifestyle choices.  Has gained 5 pounds- Encouraged need for healthy eating, regular exercise, weight loss.  Also see AVS  % Migraine-follows with Dr. Tomi Likens- Taking Sumatriptan 3 mg inj prn, provided minimal relief. Taking Fioricet prn, and Aimovig 140 mg inj monthly.    #IBS S:abdominal cramping with IBS has been so severe in past that led to passing out. Previously used belladonna alk- phenobarbital years ago usually only a few times a year when it gets severe- recently ran out. Did find out new rx is expensive so we discussed alternatives.  Also needs a refill on Phenergan  A/P: IBS reasonably controlled- except for intermittent abdominal cramping-trial Bentyl  #Tonsilloliths S:tonsil stones all his life but recently has been much more common- up to 1 a week. Tried probiotics but seemed to be bothering him more  A/P: Discussed good oral hygiene- also discussed possible referral to ENT-patient declines for now   # Erectile Dysfunction/Testosterone Dificiency S:Taking Tadalafil 2.5 mg daily working well for erectile dysfunction. Testosterone gel daily.  Lab Results  Component Value Date   TESTOSTERONE 279.59 (L) 05/02/2017  A/P: Need to update testosterone level as well as CBC to make sure no polycythemia.  Recommended 64-monthphysical and we need to get on a more regimented schedule a follow-up to be able to check blood work regularly.  #hyperlipidemia S: Poorly controlled on no Rx Lab Results  Component Value Date   CHOL 208 (H) 05/02/2017   HDL 36.10 (L) 05/02/2017   LDLCALC 84 01/14/2015   LDLDIRECT 115.0 05/02/2017   TRIG (H) 05/02/2017    421.0 Triglyceride is over 400; calculations on Lipids are invalid.   CHOLHDL 6 05/02/2017   A/P: Last 10-year ASCVD risk calculation was under 7%-we will  recalculate again today with updated lipids  #Hyperglycemia in the past-last A1c was okay but has had some weight gain-update A1c  #Health maintenance-declines flu shot  Recommended follow up: 62-monthhysical recommended Future Appointments  Date Time Provider [place]3/10/2019  8:20 AM HuMarin OlpMD [facility]    Lab/Order associations:   ICD-10-CM   1. Essential hypertension  I10   2. Hypogonadism male  E29.1 PSA    Testosterone    Testosterone 20.25 MG/ACT (1.62%) GEL  3. Hyperlipidemia, unspecified hyperlipidemia type  E78.5 CBC    Comprehensive metabolic panel    Lipid panel  4. Tonsillith  J35.8   5. Hyperglycemia  R73.9 Hemoglobin A1c  6. Gastroesophageal reflux disease without esophagitis  K21.9 omeprazole (PRILOSEC) 20 MG capsule  7. Obesity (BMI 30-39.9)  E66.9  8. Intractable episodic tension-type headache  G44.211 ondansetron (ZOFRAN) 8 MG tablet  9. High risk medication use  Z79.899 Vitamin B12    Meds ordered this encounter  Medications  . dicyclomine (BENTYL) 10 MG capsule    Sig: Take 1 capsule (10 mg total) by mouth 4 (four) times daily -  before meals and at bedtime.    Dispense:  60 capsule    Refill:  2  . Tadalafil 2.5 MG TABS    Sig: TAKE 1 TABLET DAILY    Dispense:  90 tablet    Refill:  3  . Testosterone 20.25 MG/ACT (1.62%) GEL    Sig: APPLY 4 PUMPS TOPICALLY DAILY.    Dispense:  450 g    Refill:  5  . omeprazole (PRILOSEC) 20 MG capsule    Sig: Take 1 capsule (20 mg total) by mouth daily.    Dispense:  90 capsule    Refill:  3  . ondansetron (ZOFRAN) 8 MG tablet    Sig: TAKE 1 TABLET (8 MG TOTAL) BY MOUTH EVERY 8 (EIGHT) HOURS AS NEEDED.    Dispense:  9 tablet    Refill:  11   Return precautions advised.  Garret Reddish, MD

## 2019-05-13 ENCOUNTER — Other Ambulatory Visit: Payer: Self-pay | Admitting: Family Medicine

## 2019-05-22 ENCOUNTER — Telehealth: Payer: Self-pay | Admitting: *Deleted

## 2019-05-22 ENCOUNTER — Encounter: Payer: Self-pay | Admitting: Neurology

## 2019-05-22 ENCOUNTER — Other Ambulatory Visit: Payer: Self-pay

## 2019-05-22 ENCOUNTER — Encounter: Payer: Self-pay | Admitting: *Deleted

## 2019-05-22 ENCOUNTER — Telehealth (INDEPENDENT_AMBULATORY_CARE_PROVIDER_SITE_OTHER): Payer: BC Managed Care – PPO | Admitting: Neurology

## 2019-05-22 VITALS — Ht 68.0 in | Wt 227.0 lb

## 2019-05-22 DIAGNOSIS — G43709 Chronic migraine without aura, not intractable, without status migrainosus: Secondary | ICD-10-CM

## 2019-05-22 MED ORDER — UBRELVY 100 MG PO TABS: 100.0000 mg | ORAL_TABLET | ORAL | 0 refills | Status: DC | PRN

## 2019-05-22 MED ORDER — EMGALITY 120 MG/ML ~~LOC~~ SOAJ: 240.0000 mg | Freq: Once | SUBCUTANEOUS | 0 refills | Status: DC

## 2019-05-22 NOTE — Progress Notes (Signed)
Virtual Visit via Video Note The purpose of this virtual visit is to provide medical care while limiting exposure to the novel coronavirus.    Consent was obtained for video visit:  Yes.   Answered questions that patient had about telehealth interaction:  Yes.   I discussed the limitations, risks, security and privacy concerns of performing an evaluation and management service by telemedicine. I also discussed with the patient that there may be a patient responsible charge related to this service. The patient expressed understanding and agreed to proceed.  Pt location: Home Physician Location: office Name of referring provider:  Marin Olp, MD I connected with Steven Roan Dannemiller at patients initiation/request on 05/22/2019 at  9:10 AM EDT by video enabled telemedicine application and verified that I am speaking with the correct person using two identifiers. Pt MRN:  [id number] Pt DOB:  [birth year] Video Participants:  Steven Holland   History of Present Illness:  Steven Holland is a 48 year old man who follows up for migraines.  UPDATE: No significant changes but slight increase in mild headaches and slight decrease in severe headaches Intensity:  4/10 (7-8/10 severe) Duration:  Manageable within 2 hours with Fioricet with codeine.  Frequency:  20 days of headache per month (5-7 times a month misses work) Frequency of abortive medication: Fioricet with codeine 12 to 15 pills a month. Current NSAIDS:  none Current analgesics:  Fioricet with codeine Current triptans:  none Current ergotamine: None Current anti-emetic: Zofran 8 mg Current muscle relaxants: None Current anti-anxiolytic: None Current sleep aide: None Current Antihypertensive medications: Hydrochlorothiazide Current Antidepressant medications: Nortriptyline 50 mg at bedtime Current Anticonvulsant medications: None Current anti-CGRP:  Aimovig 140 mg monthly Current Vitamins/Herbal/Supplements: B12,  MVI, Mg 409WJ daily, folic acid Current Antihistamines/Decongestants: None Hormones:  testosterone  Caffeine: 1 or less Cokes per day Alcohol: Rarely Smoker: No Diet: Drinks over 216 ounce bottles of water daily Exercise: Rides an elliptical about 3 times a week Depression: No; Anxiety: No Other pain:  no Sleep hygiene: 5 hours a night  HISTORY:  Onset: Since early 20s. Location: Back of head/neck or bi-temporal.  Quality: pounding Initial intensity: Varies from 2-3/10 to 5-6/10, rarely 7-8/10 Aura: no Prodrome: no Postdrome: no Associated symptoms: Nausea, photophobia, phonophobia. He denies thunderclap headache. Initial Duration: All day or days if not pain reliever. Otherwise, 2 to 4 hours. Sometimes wakes up with headache Initial Frequency: 4-5 days a week. Initial Frequency of abortive medication: 4-5 days a week Triggers: Change of barometric pressure, allergies Relieving factors: no Activity: aggravates  Past NSAIDS: ibuprofen, Sprix NS (effective 50% of time but burns) Past analgesics: Tylenol, vicodin (worked) Past abortive triptans: Sumatriptan tablet, Maxalt, Relpax, Zembrace (all ineffective) Past ergots:  Migranal Past muscle relaxants: no Past anti-emetic: no Past antihypertensive medications: bisoprolol Past antidepressant medications: no Past anticonvulsant medications: topiramate 169m (paresthesias, impotence) Past vitamins/Herbal/Supplements: no Other past therapies: no  Family history of headache: Mother's side  Past Medical History: Past Medical History:  Diagnosis Date  . Chicken pox   . Chronic headaches   . Hyperlipidemia   . Hypertension   . IBS (irritable bowel syndrome)   . Low testosterone   . Obesity   . Syncope    related to IBS and severe nausea    Medications: Outpatient Encounter Medications as of 05/22/2019  Medication Sig  . AIMOVIG 140 MG/ML SOAJ INJECT 140 MG INTO THE SKIN EVERY 30 (THIRTY)  DAYS.  .Maggie FontALK-PHENOBARBITAL PO Take by mouth. Take 1  tablet by mouth before meals and at bedtime prn for abd cramping.  . butalbital-acetaminophen-caffeine (FIORICET WITH CODEINE) 50-325-40-30 MG capsule TAKE 1 CAPSULE BY MOUTH EVERY 6 HOURS AS NEEDED  . dicyclomine (BENTYL) 10 MG capsule TAKE 1 CAPSULE (10 MG TOTAL) BY MOUTH 4 (FOUR) TIMES DAILY - BEFORE MEALS AND AT BEDTIME.  . folic acid (FOLVITE) 633 MCG tablet Take 400 mcg by mouth daily.  . hydrochlorothiazide (MICROZIDE) 12.5 MG capsule TAKE 1 CAPSULE BY MOUTH EVERY DAY  . Magnesium 250 MG TABS Take 250 mg by mouth daily.   . Multiple Vitamin (MULITIVITAMIN WITH MINERALS) TABS Take 1 tablet by mouth daily.  . nortriptyline (PAMELOR) 50 MG capsule TAKE 1 CAPSULE (50 MG TOTAL) BY MOUTH AT BEDTIME.  Marland Kitchen omeprazole (PRILOSEC) 20 MG capsule Take 1 capsule (20 mg total) by mouth daily.  . ondansetron (ZOFRAN) 8 MG tablet TAKE 1 TABLET (8 MG TOTAL) BY MOUTH EVERY 8 (EIGHT) HOURS AS NEEDED.  Marland Kitchen Potassium Bicarbonate 99 MG CAPS Take 1 capsule by mouth daily.  . SUMAtriptan Succinate (ZEMBRACE SYMTOUCH) 3 MG/0.5ML SOAJ Inject 3 mg into the skin as directed. May repeat in 1 hour as needed. No more than 2/24  . Tadalafil 2.5 MG TABS TAKE 1 TABLET DAILY  . Testosterone 20.25 MG/ACT (1.62%) GEL APPLY 4 PUMPS TOPICALLY DAILY.  . vitamin B-12 (CYANOCOBALAMIN) 1000 MCG tablet Take 1,000 mcg by mouth daily.   No facility-administered encounter medications on file as of 05/22/2019.     Allergies: Allergies  Allergen Reactions  . Sulfa Antibiotics Swelling    Eyes became swollen and ear became swollen (localized)  . Milk-Related Compounds Other (See Comments)    Intense abdominal pain and passing out if he consumes over 4 ounces of milk , pt can have ice cream , only happens with plain milk, can have it in cakes, etc.    Family History: Family History  Problem Relation Age of Onset  . Arthritis Mother        knee replacements and back  .  Heart disease Father        17s reportedly- patient no issues  . Benign prostatic hyperplasia Father   . Other Brother        hypogonadism/low T both brothers  . Migraines Brother        bothbrothers  . Prostate cancer Maternal Grandfather   . Diabetes Paternal Grandmother     Social History: Social History   Socioeconomic History  . Marital status: Married    Spouse name: Not on file  . Number of children: 0  . Years of education: Not on file  . Highest education level: Not on file  Occupational History  . Occupation: Engineer, mining  Social Needs  . Financial resource strain: Not on file  . Food insecurity    Worry: Not on file    Inability: Not on file  . Transportation needs    Medical: Not on file    Non-medical: Not on file  Tobacco Use  . Smoking status: Never Smoker  . Smokeless tobacco: Never Used  Substance and Sexual Activity  . Alcohol use: Yes    Comment: rare  . Drug use: No  . Sexual activity: Yes  Lifestyle  . Physical activity    Days per week: Not on file    Minutes per session: Not on file  . Stress: Not on file  Relationships  . Social connections    Talks on phone: Not on file  Gets together: Not on file    Attends religious service: Not on file    Active member of club or organization: Not on file    Attends meetings of clubs or organizations: Not on file    Relationship status: Not on file  . Intimate partner violence    Fear of current or ex partner: Not on file    Emotionally abused: Not on file    Physically abused: Not on file    Forced sexual activity: Not on file  Other Topics Concern  . Not on file  Social History Narrative   Married wife Hilda Blades. No children.       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 Very busy at work. Enjoys Engineer, site. Household projects and Architect.     Observations/Objective:   Height '5\' 8"'  (1.727 m), weight 227 lb (103 kg). No acute distress.   Alert and oriented.  Speech fluent and not dysarthric.  Language intact.  Eyes orthophoric on primary gaze.  Face symmetric.  Assessment and Plan:   Chronic migraine without aura, without status migrainosus, not intractable  1.  For preventative management, advised to switch to [place], Ajovy, Vyepti or Botox.  He wants to look into Emgality and will get back to me 2.  For abortive therapy, he will try Ubrelvy 1110m (samples will be provided) 3.  Limit use of pain relievers to no more than 2 days out of week to prevent risk of rebound or medication-overuse headache. 4.  Keep headache diary 5.  Exercise, hydration, caffeine cessation, sleep hygiene, monitor for and avoid triggers 6.  Consider:  magnesium citrate 4069mdaily, riboflavin 40078maily, and coenzyme Q10 100m57mree times daily 7. Always keep in mind that currently taking a hormone or birth control may be a possible trigger or aggravating factor for migraine. 8. Follow up 5 months  Follow Up Instructions:    -I discussed the assessment and treatment plan with the patient. The patient was provided an opportunity to ask questions and all were answered. The patient agreed with the plan and demonstrated an understanding of the instructions.   The patient was advised to call back or seek an in-person evaluation if the symptoms worsen or if the condition fails to improve as anticipated.   AdamDudley Major

## 2019-05-22 NOTE — Addendum Note (Signed)
Addended by: Jesse Fall on: 05/22/2019 11:10 AM   Modules accepted: Orders

## 2019-05-22 NOTE — Progress Notes (Signed)
Asaph Rynders (Key: [id number]) Rx #: [id number] Emgality 120MG /ML auto-injectors (migraine)   Form [employer] Electronic PA Form Created 6 hours ago Sent to Plan 5 hours ago Plan Response 5 hours ago Submit Clinical Questions 2 hours ago Determination Favorable 1 hour ago Message from [employer];Review Type:Prior Auth;Coverage Start Date:04/22/2019;Coverage End Date:05/21/2020;;CaseId:[id number];Status:Approved;Review Type:Qty;Coverage Start Date:04/22/2019;Coverage End Date:06/21/2019;

## 2019-05-22 NOTE — Telephone Encounter (Signed)
Samples of Ubrelvy 100mg  left at front desk for patient's wife to pick up.

## 2019-05-22 NOTE — Progress Notes (Addendum)
Lorren Schwall (Key: [id number]) Rx #: [id number] Emgality 120MG /ML auto-injectors (migraine)   Form [employer] Electronic PA Form Created 2 hours ago Sent to Plan less than a minute ago Determination Wait for Questions [employer] typically responds with questions in less than 15 minutes, but may take up to 24 hours.  This was denied then appealed via covermy meds waiting for new determination.

## 2019-05-27 ENCOUNTER — Other Ambulatory Visit: Payer: Self-pay | Admitting: Family Medicine

## 2019-05-30 ENCOUNTER — Other Ambulatory Visit: Payer: Self-pay | Admitting: Neurology

## 2019-05-30 MED ORDER — EMGALITY 120 MG/ML ~~LOC~~ SOAJ: 240.0000 mg | Freq: Once | SUBCUTANEOUS | 0 refills | Status: AC

## 2019-05-30 MED ORDER — EMGALITY 120 MG/ML ~~LOC~~ SOAJ: 120.0000 mg | SUBCUTANEOUS | 11 refills | Status: DC

## 2019-06-06 ENCOUNTER — Encounter: Payer: Self-pay | Admitting: *Deleted

## 2019-06-06 ENCOUNTER — Other Ambulatory Visit: Payer: Self-pay | Admitting: Neurology

## 2019-06-06 MED ORDER — UBRELVY 100 MG PO TABS: 1.0000 | ORAL_TABLET | ORAL | 3 refills | Status: DC | PRN

## 2019-06-06 NOTE — Progress Notes (Signed)
Steven Holland (Steven Holland) Rx #: [id number] Steven Holland 100MG  tablets   Form [employer] Electronic PA Form Created 41 minutes ago Sent to Plan 5 minutes ago Plan Response 4 minutes ago Submit Clinical Questions less than a minute ago Determination Favorable less than a minute ago Message from [place];Review Type:Prior Auth;Coverage Start Date:05/07/2019;Coverage End Date:06/05/2020;

## 2019-06-07 NOTE — Telephone Encounter (Signed)
Please advise 

## 2019-07-04 ENCOUNTER — Encounter: Payer: Self-pay | Admitting: Gastroenterology

## 2019-08-09 ENCOUNTER — Encounter: Payer: Self-pay | Admitting: Gastroenterology

## 2019-08-09 ENCOUNTER — Other Ambulatory Visit: Payer: Self-pay

## 2019-08-09 ENCOUNTER — Ambulatory Visit (AMBULATORY_SURGERY_CENTER): Payer: BC Managed Care – PPO | Admitting: *Deleted

## 2019-08-09 VITALS — Temp 97.0°F | Ht 68.0 in | Wt 222.2 lb

## 2019-08-09 DIAGNOSIS — Z1211 Encounter for screening for malignant neoplasm of colon: Secondary | ICD-10-CM

## 2019-08-09 DIAGNOSIS — Z1159 Encounter for screening for other viral diseases: Secondary | ICD-10-CM

## 2019-08-09 DIAGNOSIS — K2960 Other gastritis without bleeding: Secondary | ICD-10-CM

## 2019-08-09 MED ORDER — SUPREP BOWEL PREP KIT 17.5-3.13-1.6 GM/177ML PO SOLN: 1.0000 | Freq: Once | ORAL | 0 refills | Status: AC

## 2019-08-09 NOTE — Progress Notes (Signed)
Patient denies any allergies to egg or soy products. Patient has never been sedated, no prior surgeries.  Patient denies oxygen use at home and denies diet medications. Emmi instructions for colonoscopy/endoscopy explained and given to patient.  Suprep coupon given.

## 2019-08-20 ENCOUNTER — Other Ambulatory Visit: Payer: Self-pay | Admitting: Gastroenterology

## 2019-08-20 ENCOUNTER — Ambulatory Visit (INDEPENDENT_AMBULATORY_CARE_PROVIDER_SITE_OTHER): Payer: BC Managed Care – PPO

## 2019-08-20 DIAGNOSIS — Z1159 Encounter for screening for other viral diseases: Secondary | ICD-10-CM

## 2019-08-21 ENCOUNTER — Other Ambulatory Visit: Payer: Self-pay | Admitting: Neurology

## 2019-08-21 LAB — SARS CORONAVIRUS 2 (TAT 6-24 HRS): SARS Coronavirus 2: NEGATIVE

## 2019-08-23 ENCOUNTER — Other Ambulatory Visit: Payer: Self-pay

## 2019-08-23 ENCOUNTER — Encounter: Payer: Self-pay | Admitting: Gastroenterology

## 2019-08-23 ENCOUNTER — Ambulatory Visit (AMBULATORY_SURGERY_CENTER): Payer: BC Managed Care – PPO | Admitting: Gastroenterology

## 2019-08-23 VITALS — BP 126/84 | HR 80 | Temp 98.5°F | Resp 17 | Ht 68.0 in | Wt 222.2 lb

## 2019-08-23 DIAGNOSIS — K317 Polyp of stomach and duodenum: Secondary | ICD-10-CM

## 2019-08-23 DIAGNOSIS — Z1211 Encounter for screening for malignant neoplasm of colon: Secondary | ICD-10-CM

## 2019-08-23 DIAGNOSIS — K2960 Other gastritis without bleeding: Secondary | ICD-10-CM

## 2019-08-23 MED ORDER — SODIUM CHLORIDE 0.9 % IV SOLN: 500.0000 mL | Freq: Once | INTRAVENOUS | Status: DC

## 2019-08-23 NOTE — Patient Instructions (Signed)
Impression/Recommendations:  Hiatal hernia handout given to patient. Hemorrhoid handout given to patient.  Resume previous diet. Continue present medications. Await pathology results.  Repeat colonoscopy in 10 years for screening purposes.  YOU HAD AN ENDOSCOPIC PROCEDURE TODAY AT [facility]:   Refer to the procedure report that was given to you for any specific questions about what was found during the examination.  If the procedure report does not answer your questions, please call your gastroenterologist to clarify.  If you requested that your care partner not be given the details of your procedure findings, then the procedure report has been included in a sealed envelope for you to review at your convenience later.  YOU SHOULD EXPECT: Some feelings of bloating in the abdomen. Passage of more gas than usual.  Walking can help get rid of the air that was put into your GI tract during the procedure and reduce the bloating. If you had a lower endoscopy (such as a colonoscopy or flexible sigmoidoscopy) you may notice spotting of blood in your stool or on the toilet paper. If you underwent a bowel prep for your procedure, you may not have a normal bowel movement for a few days.  Please Note:  You might notice some irritation and congestion in your nose or some drainage.  This is from the oxygen used during your procedure.  There is no need for concern and it should clear up in a day or so.  SYMPTOMS TO REPORT IMMEDIATELY:   Following lower endoscopy (colonoscopy or flexible sigmoidoscopy):  Excessive amounts of blood in the stool  Significant tenderness or worsening of abdominal pains  Swelling of the abdomen that is new, acute  Fever of 100F or higher   Following upper endoscopy (EGD)  Vomiting of blood or coffee ground material  New chest pain or pain under the shoulder blades  Painful or persistently difficult swallowing  New shortness of breath  Fever of 100F or  higher  Black, tarry-looking stools  For urgent or emergent issues, a gastroenterologist can be reached at any hour by calling [phone].   DIET:  We do recommend a small meal at first, but then you may proceed to your regular diet.  Drink plenty of fluids but you should avoid alcoholic beverages for 24 hours.  ACTIVITY:  You should plan to take it easy for the rest of today and you should NOT DRIVE or use heavy machinery until tomorrow (because of the sedation medicines used during the test).    FOLLOW UP: Our staff will call the number listed on your records 48-72 hours following your procedure to check on you and address any questions or concerns that you may have regarding the information given to you following your procedure. If we do not reach you, we will leave a message.  We will attempt to reach you two times.  During this call, we will ask if you have developed any symptoms of COVID 19. If you develop any symptoms (ie: fever, flu-like symptoms, shortness of breath, cough etc.) before then, please call [phone].  If you test positive for Covid 19 in the 2 weeks post procedure, please call and report this information to [place].    If any biopsies were taken you will be contacted by phone or by letter within the next 1-3 weeks.  Please call us at [phone] if you have not heard about the biopsies in 3 weeks.    SIGNATURES/CONFIDENTIALITY: You and/or your care partner have  signed paperwork which will be entered into your electronic medical record.  These signatures attest to the fact that that the information above on your After Visit Summary has been reviewed and is understood.  Full responsibility of the confidentiality of this discharge information lies with you and/or your care-partner.

## 2019-08-23 NOTE — Progress Notes (Signed)
Vs-CW Temp- LC  Pt's states no medical or surgical changes since previsit or office visit.  

## 2019-08-23 NOTE — Progress Notes (Signed)
Called to room to assist during endoscopic procedure.  Patient ID and intended procedure confirmed with present staff. Received instructions for my participation in the procedure from the performing physician.  

## 2019-08-23 NOTE — Op Note (Addendum)
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 The patient's tolerance of                            previous anesthesia was also reviewed. The risks                            and benefits of the procedure and the sedation                            options and risks were discussed with the patient.                            All questions were answered, and informed consent                            was obtained. Prior Anticoagulants: The patient has                            taken no previous anticoagulant or antiplatelet                            agents. ASA Grade Assessment: II - A patient with                            mild systemic disease. After reviewing the risks                            and benefits, the patient was deemed in                            satisfactory condition to undergo the procedure.  After obtaining informed consent, the endoscope was                            passed under direct vision. Throughout the                            procedure, the patient's blood pressure, pulse, and                            oxygen saturations were monitored continuously. The                            Endoscope was introduced through the mouth, and                            advanced to the third  part of duodenum. The upper                            GI endoscopy was accomplished without difficulty.                            The patient tolerated the procedure well. Scope In: Scope Out: Findings:                 The esophagus was normal.                           Diffuse mildly erythematous mucosa without bleeding                            was found in the gastric body and in the gastric                            antrum. Biopsies were taken with a cold forceps for                            histology. Estimated blood loss was minimal.                           Multiple small sessile polyps with no bleeding and                            no stigmata of recent bleeding were found in the                            gastric fundus and in the gastric body. Biopsies                            were taken with a cold forceps for histology.                            Estimated blood loss was minimal.  A small hiatal hernia was present.                           The examined duodenum was normal.                           The cardia and gastric fundus were normal on                            retroflexion.                           The exam was otherwise without abnormality. Complications:            No immediate complications. Estimated blood loss:                            Minimal. Estimated Blood Loss:     Estimated blood loss was minimal. Impression:               - Normal esophagus.                           - Mild erythematous mucosa in the gastric body and                            antrum. Biopsied.                           - Multiple gastric polyps. Biopsied.                           - Small hiatal hernia.                           - Normal examined duodenum.                           - The examination was otherwise normal. Recommendation:           - Patient has a contact number available for                            emergencies. The signs and  symptoms of potential                            delayed complications were discussed with the                            patient. Return to normal activities tomorrow.                            Written discharge instructions were provided to the                            patient.                           -  Resume previous diet.                           - Continue present medications.                           - Await pathology results.                           - Proceed with colonoscopy as previously planned. Tressia Danas MD, MD 08/23/2019 10:04:33 AM This report has been signed electronically.

## 2019-08-23 NOTE — Op Note (Addendum)
[place] Patient Name: Steven Holland Procedure Date: 08/23/2019 9:29 AM MRN: [id number] Endoscopist: Thornton Park MD, MD Age: 48 Referring MD:  Date of Birth: 1970/11/02 Gender: Male Account #: [id number] Procedure:                Colonoscopy Indications:              Screening for colorectal malignant neoplasm, , This                            is the patient's first colonoscopy                           No known family history of colon cancer or polyps. Medicines:                Monitored Anesthesia Care Procedure:                Pre-Anesthesia Assessment:                           - Prior to the procedure, a History and Physical                            was performed, and patient medications and                            allergies were reviewed. The patient's tolerance of                            previous anesthesia was also reviewed. The risks                            and benefits of the procedure and the sedation                            options and risks were discussed with the patient.                            All questions were answered, and informed consent                            was obtained. Prior Anticoagulants: The patient has                            taken no previous anticoagulant or antiplatelet                            agents. ASA Grade Assessment: II - A patient with                            mild systemic disease. After reviewing the risks                            and benefits, the patient was deemed in  satisfactory condition to undergo the procedure.                           After obtaining informed consent, the colonoscope                            was passed under direct vision. Throughout the                            procedure, the patient's blood pressure, pulse, and                            oxygen saturations were monitored continuously. The                            Colonoscope was  introduced through the anus and                            advanced to the the cecum, identified by                            appendiceal orifice and ileocecal valve. A second                            forward view of the right colon was performed. The                            patient tolerated the procedure well. The quality                            of the bowel preparation was good. The terminal                            ileum, ileocecal valve, appendiceal orifice, and                            rectum were photographed. The colonoscopy was                            performed with moderate difficulty due to a                            redundant colon, significant looping and a tortuous                            colon. Successful completion of the procedure was                            aided by applying abdominal pressure. Scope In: 9:47:01 AM Scope Out: 10:00:23 AM Scope Withdrawal Time: 0 hours 9 minutes 5 seconds  Total Procedure Duration: 0 hours 13 minutes 22 seconds  Findings:                 Non-bleeding external and internal hemorrhoids were  found. The hemorrhoids were small.                           The entire examined colon appeared normal on direct                            and retroflexion views. Complications:            No immediate complications. Estimated Blood Loss:     Estimated blood loss: none. Impression:               - Non-bleeding external and internal hemorrhoids.                           - The entire examined colon is normal on direct and                            retroflexion views.                           - No specimens collected. Recommendation:           - Patient has a contact number available for                            emergencies. The signs and symptoms of potential                            delayed complications were discussed with the                            patient. Return to normal activities  tomorrow.                            Written discharge instructions were provided to the                            patient.                           - Resume previous diet.                           - Continue present medications.                           - Await pathology results.                           - Repeat colonoscopy in 10 years for screening                            purposes. Tressia DanasKimberly Amarya Kuehl MD, MD 08/23/2019 10:08:45 AM This report has been signed electronically.

## 2019-08-23 NOTE — Progress Notes (Signed)
PT taken to PACU. Monitors in place. VSS. Report given to RN. 

## 2019-08-24 ENCOUNTER — Other Ambulatory Visit: Payer: Self-pay | Admitting: Family Medicine

## 2019-08-24 DIAGNOSIS — I10 Essential (primary) hypertension: Secondary | ICD-10-CM

## 2019-08-27 ENCOUNTER — Encounter: Payer: Self-pay | Admitting: Gastroenterology

## 2019-08-27 ENCOUNTER — Telehealth: Payer: Self-pay | Admitting: *Deleted

## 2019-08-27 ENCOUNTER — Telehealth: Payer: Self-pay

## 2019-08-27 NOTE — Telephone Encounter (Signed)
Second follow up call attempt.  No answer or ability to leave a message. 

## 2019-08-27 NOTE — Telephone Encounter (Signed)
1st follow up call made.  NALM 

## 2019-09-23 ENCOUNTER — Other Ambulatory Visit: Payer: Self-pay | Admitting: Neurology

## 2019-10-23 ENCOUNTER — Encounter: Payer: Self-pay | Admitting: Neurology

## 2019-10-23 NOTE — Progress Notes (Signed)
Virtual Visit via Video Note The purpose of this virtual visit is to provide medical care while limiting exposure to the novel coronavirus.    Consent was obtained for video visit:  Yes.   Answered questions that patient had about telehealth interaction:  Yes.   I discussed the limitations, risks, security and privacy concerns of performing an evaluation and management service by telemedicine. I also discussed with the patient that there may be a patient responsible charge related to this service. The patient expressed understanding and agreed to proceed.  Pt location: Home Physician Location: office Name of referring provider:  Marin Olp, MD I connected with Steven Holland at patients initiation/request on 10/25/2019 at 10:10 AM EST by video enabled telemedicine application and verified that I am speaking with the correct person using two identifiers. Pt MRN:  [id number] Pt DOB:  1971-04-13 Video Participants:  Steven Roan Schermerhorn   History of Present Illness:  Steven Holland a 49 year old man who follows up for migraines.  UPDATE: He started [place] in September and [place].    Intensity:  Now usually 2-3/10 (no higher than 5/10) Duration:  Manageable within 2 hours with Fioricet with codeine; 4 hours with [name] Frequency:  15 headache days except increased this past month due to weather and stress. Frequency of abortive medication:Fioricet with codeine 12 to 15 pills a month. Current NSAIDS:none Current analgesics:Fioricetwith codeine Current triptans:none Current ergotamine:None Current anti-emetic:Zofran 8 mg Current muscle relaxants:None Current anti-anxiolytic:None Current sleep aide:None Current Antihypertensive medications:Hydrochlorothiazide Current Antidepressant medications:Nortriptyline 50 mg at bedtime Current Anticonvulsant medications:None Current anti-CGRP:Emgality; Ubrelvy 124m Current  Vitamins/Herbal/Supplements:B12, MVI, Mg 2588FOdaily, folic acid Current Antihistamines/Decongestants:None Hormones:  testosterone  Caffeine:1 or less Cokes per day Alcohol:Rarely Smoker:No Diet:Drinks over two 16 ounce bottles of water daily Exercise:Rides an elliptical about 3 times a week Depression:No; Anxiety:No Other pain:no Sleep hygiene:5 hours a night  HISTORY: Onset: Since early 20s. Location: Back of head/neck or bi-temporal.  Quality: pounding Initial intensity: Varies from 2-3/10 to 5-6/10, rarely 7-8/10 Aura: no Prodrome: no Postdrome: no Associated symptoms: Nausea, photophobia, phonophobia. He denies thunderclap headache. Initial Duration: All day or days if not pain reliever. Otherwise, 2 to 4 hours. Sometimes wakes up with headache Initial Frequency: 4-5 days a week. Initial Frequency of abortive medication: 4-5 days a week Triggers: Change of barometric pressure, allergies Relieving factors: no Activity: aggravates  Past NSAIDS: ibuprofen, Sprix NS (effective 50% of time but burns) Past analgesics: Tylenol, vicodin (worked) Past abortive triptans: Sumatriptan tablet, Maxalt,Relpax, Zembrace (all ineffective) Past ergots: Migranal Past muscle relaxants: no Past anti-emetic: no Past antihypertensive medications: bisoprolol Past antidepressant medications: no Past anticonvulsant medications: topiramate 1080m(paresthesias, impotence) Past CGRP inhibitor:  Aimovig 14030mast vitamins/Herbal/Supplements: no Other past therapies: no  Family history of headache: Mother's side  Past Medical History: Past Medical History:  Diagnosis Date  . Allergy   . Anxiety   . Chicken pox   . Chronic headaches   . Depression   . GERD (gastroesophageal reflux disease)   . Hyperlipidemia   . Hypertension   . IBS (irritable bowel syndrome)   . Low testosterone   . Obesity   . Syncope    related to IBS and severe nausea, no  current problems    Medications: Outpatient Encounter Medications as of 10/25/2019  Medication Sig Note  . butalbital-acetaminophen-caffeine (FIORICET WITH CODEINE) 50-325-40-30 MG capsule TAKE 1 CAPSULE BY MOUTH EVERY 6 HOURS AS NEEDED   . BELLADONNA ALK-PHENOBARBITAL PO Take by mouth. Take  1 tablet by mouth before meals and at bedtime prn for abd cramping. 05/22/2019: Few left but MD is switching him to a new med   . dicyclomine (BENTYL) 10 MG capsule TAKE 1 CAPSULE (10 MG TOTAL) BY MOUTH 4 (FOUR) TIMES DAILY - BEFORE MEALS AND AT BEDTIME. 05/22/2019: New hasn't started  . folic acid (FOLVITE) 295 MCG tablet Take 400 mcg by mouth daily.   . Galcanezumab-gnlm (EMGALITY) 120 MG/ML SOAJ Inject 120 mg into the skin every 30 (thirty) days. 08/09/2019: Last inj 07/21/19  . hydrochlorothiazide (MICROZIDE) 12.5 MG capsule TAKE 1 CAPSULE BY MOUTH EVERY DAY   . Magnesium 250 MG TABS Take 250 mg by mouth daily.    . Multiple Vitamin (MULITIVITAMIN WITH MINERALS) TABS Take 1 tablet by mouth daily.   . nortriptyline (PAMELOR) 50 MG capsule TAKE 1 CAPSULE (50 MG TOTAL) BY MOUTH AT BEDTIME.   Marland Kitchen omeprazole (PRILOSEC) 20 MG capsule Take 1 capsule (20 mg total) by mouth daily.   . ondansetron (ZOFRAN) 8 MG tablet TAKE 1 TABLET (8 MG TOTAL) BY MOUTH EVERY 8 (EIGHT) HOURS AS NEEDED.   Marland Kitchen Potassium Bicarbonate 99 MG CAPS Take 1 capsule by mouth daily.   . Tadalafil 2.5 MG TABS TAKE 1 TABLET DAILY   . Testosterone 20.25 MG/ACT (1.62%) GEL APPLY 4 PUMPS TOPICALLY DAILY.   Marland Kitchen Ubrogepant (UBRELVY) 100 MG TABS Take 1 tablet by mouth as needed (May repeat dose in 2 hours.  Maximum 2 tablets in 24 hours).   . vitamin B-12 (CYANOCOBALAMIN) 1000 MCG tablet Take 1,000 mcg by mouth daily.    No facility-administered encounter medications on file as of 10/25/2019.    Allergies: Allergies  Allergen Reactions  . Sulfa Antibiotics Swelling    Eyes became swollen and ear became swollen (localized)  . Milk-Related Compounds  Other (See Comments)    Intense abdominal pain and passing out if he consumes over 4 ounces of milk , pt can have ice cream , only happens with plain milk, can have it in cakes, etc.  . Nickel Rash    Family History: Family History  Problem Relation Age of Onset  . Arthritis Mother        knee replacements and back  . Heart disease Father        57s reportedly- patient no issues  . Benign prostatic hyperplasia Father   . Other Brother        hypogonadism/low T both brothers  . Migraines Brother        bothbrothers  . Prostate cancer Maternal Grandfather   . Diabetes Paternal Grandmother   . Colon cancer Neg Hx   . Rectal cancer Neg Hx   . Stomach cancer Neg Hx   . Esophageal cancer Neg Hx     Social History: Social History   Socioeconomic History  . Marital status: Married    Spouse name: Not on file  . Number of children: 0  . Years of education: Not on file  . Highest education level: Not on file  Occupational History  . Occupation: Engineer, mining  Tobacco Use  . Smoking status: Never Smoker  . Smokeless tobacco: Never Used  Substance and Sexual Activity  . Alcohol use: Yes    Comment: occasional liquor/wine  . Drug use: No  . Sexual activity: Yes  Other Topics Concern  . Not on file  Social History Narrative   Married wife Hilda Blades. No children.       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 Very busy at work. Enjoys Engineer, site. Household projects and Architect.       Caffeine 1-2 sodas day/ exercise a little/ R handed   Social Determinants of Health   Financial Resource Strain:   . Difficulty of Paying Living Expenses: Not on file  Food Insecurity:   . Worried About Charity fundraiser in the Last Year: Not on file  . Ran Out of Food in the Last Year: Not on file  Transportation Needs:   . Lack of Transportation (Medical): Not on file  . Lack of Transportation (Non-Medical): Not on file  Physical  Activity:   . Days of Exercise per Week: Not on file  . Minutes of Exercise per Session: Not on file  Stress:   . Feeling of Stress : Not on file  Social Connections:   . Frequency of Communication with Friends and Family: Not on file  . Frequency of Social Gatherings with Friends and Family: Not on file  . Attends Religious Services: Not on file  . Active Member of Clubs or Organizations: Not on file  . Attends Archivist Meetings: Not on file  . Marital Status: Not on file  Intimate Partner Violence:   . Fear of Current or Ex-Partner: Not on file  . Emotionally Abused: Not on file  . Physically Abused: Not on file  . Sexually Abused: Not on file    Observations/Objective:   Height '5\' 8"'  (1.727 m), weight 215 lb (97.5 kg). No acute distress.  Alert and oriented.  Speech fluent and not dysarthric.  Language intact.  Eyes orthophoric on primary gaze.  Face symmetric.  Assessment and Plan:   Chronic migraine without aura, without status migrainosus, not intractable.  Improved on Emgality.  Uptick in headaches over past month due to stress and weather.  1.  For preventative management, Emgality.  I will increase nortriptyline to 23m at bedtime 2.  For abortive therapy, he will try Nurtec; Fioricet with codeine as second line 3.  Limit use of pain relievers to no more than 2 days out of week to prevent risk of rebound or medication-overuse headache. 4.  Keep headache diary 5.  Exercise, hydration, caffeine cessation, sleep hygiene, monitor for and avoid triggers 6. Follow up 5 months.   Follow Up Instructions:    -I discussed the assessment and treatment plan with the patient. The patient was provided an opportunity to ask questions and all were answered. The patient agreed with the plan and demonstrated an understanding of the instructions.   The patient was advised to call back or seek an in-person evaluation if the symptoms worsen or if the condition fails to improve  as anticipated.    ADudley Major DO

## 2019-10-25 ENCOUNTER — Other Ambulatory Visit: Payer: Self-pay

## 2019-10-25 ENCOUNTER — Encounter: Payer: Self-pay | Admitting: Neurology

## 2019-10-25 ENCOUNTER — Telehealth (INDEPENDENT_AMBULATORY_CARE_PROVIDER_SITE_OTHER): Payer: BC Managed Care – PPO | Admitting: Neurology

## 2019-10-25 VITALS — Ht 68.0 in | Wt 215.0 lb

## 2019-10-25 DIAGNOSIS — G43709 Chronic migraine without aura, not intractable, without status migrainosus: Secondary | ICD-10-CM

## 2019-10-25 MED ORDER — NORTRIPTYLINE HCL 25 MG PO CAPS: 75.0000 mg | ORAL_CAPSULE | Freq: Every day | ORAL | 3 refills | Status: DC

## 2019-10-25 MED ORDER — EMGALITY 120 MG/ML ~~LOC~~ SOAJ: 120.0000 mg | SUBCUTANEOUS | 3 refills | Status: DC

## 2019-11-05 ENCOUNTER — Encounter: Payer: BC Managed Care – PPO | Admitting: Family Medicine

## 2019-11-18 ENCOUNTER — Other Ambulatory Visit: Payer: Self-pay | Admitting: Family Medicine

## 2019-11-18 DIAGNOSIS — E291 Testicular hypofunction: Secondary | ICD-10-CM

## 2019-11-27 ENCOUNTER — Encounter: Payer: Self-pay | Admitting: Family Medicine

## 2019-12-31 ENCOUNTER — Ambulatory Visit (INDEPENDENT_AMBULATORY_CARE_PROVIDER_SITE_OTHER): Payer: BC Managed Care – PPO | Admitting: Family Medicine

## 2019-12-31 ENCOUNTER — Other Ambulatory Visit: Payer: Self-pay

## 2019-12-31 ENCOUNTER — Encounter: Payer: Self-pay | Admitting: Family Medicine

## 2019-12-31 VITALS — BP 150/88 | HR 113 | Temp 98.6°F | Ht 68.0 in | Wt 221.2 lb

## 2019-12-31 DIAGNOSIS — G44211 Episodic tension-type headache, intractable: Secondary | ICD-10-CM | POA: Diagnosis not present

## 2019-12-31 DIAGNOSIS — F419 Anxiety disorder, unspecified: Secondary | ICD-10-CM

## 2019-12-31 DIAGNOSIS — E785 Hyperlipidemia, unspecified: Secondary | ICD-10-CM

## 2019-12-31 DIAGNOSIS — E291 Testicular hypofunction: Secondary | ICD-10-CM | POA: Diagnosis not present

## 2019-12-31 DIAGNOSIS — R739 Hyperglycemia, unspecified: Secondary | ICD-10-CM

## 2019-12-31 DIAGNOSIS — G43109 Migraine with aura, not intractable, without status migrainosus: Secondary | ICD-10-CM

## 2019-12-31 DIAGNOSIS — Z0001 Encounter for general adult medical examination with abnormal findings: Secondary | ICD-10-CM

## 2019-12-31 DIAGNOSIS — Z125 Encounter for screening for malignant neoplasm of prostate: Secondary | ICD-10-CM

## 2019-12-31 DIAGNOSIS — I10 Essential (primary) hypertension: Secondary | ICD-10-CM

## 2019-12-31 DIAGNOSIS — K219 Gastro-esophageal reflux disease without esophagitis: Secondary | ICD-10-CM | POA: Diagnosis not present

## 2019-12-31 DIAGNOSIS — N528 Other male erectile dysfunction: Secondary | ICD-10-CM

## 2019-12-31 DIAGNOSIS — F411 Generalized anxiety disorder: Secondary | ICD-10-CM

## 2019-12-31 MED ORDER — OMEPRAZOLE 20 MG PO CPDR: 20.0000 mg | DELAYED_RELEASE_CAPSULE | Freq: Every day | ORAL | 3 refills | Status: DC

## 2019-12-31 MED ORDER — TADALAFIL 2.5 MG PO TABS: ORAL_TABLET | ORAL | 3 refills | Status: DC

## 2019-12-31 MED ORDER — BUSPIRONE HCL 7.5 MG PO TABS: 7.5000 mg | ORAL_TABLET | Freq: Two times a day (BID) | ORAL | 1 refills | Status: DC | PRN

## 2019-12-31 MED ORDER — HYDROCHLOROTHIAZIDE 12.5 MG PO CAPS: ORAL_CAPSULE | ORAL | 3 refills | Status: DC

## 2019-12-31 MED ORDER — DICYCLOMINE HCL 10 MG PO CAPS: 10.0000 mg | ORAL_CAPSULE | Freq: Three times a day (TID) | ORAL | 2 refills | Status: DC

## 2019-12-31 NOTE — Patient Instructions (Addendum)
Health Maintenance Due  Topic Date Due  . COVID-19 Vaccine (1) Patient not interested in  Never done   Schedule a lab visit at the check out desk within 2 weeks. Return for future fasting labs meaning nothing but water after midnight please. Ok to take your medications with water.   Try buspirone up to twice a day for anxiety-if this is not effective please let us know-we also discussed possibly trying higher dose buspirone or hydroxyzine as the next step up  Recommended follow up: Return in about 6 months (around 07/01/2020) for follow up- or sooner if needed.

## 2019-12-31 NOTE — Progress Notes (Signed)
Phone: [phone]   Subjective:  Patient presents today for their annual physical. Chief complaint-noted.   See problem oriented charting- Review of Systems  Constitutional: Negative for chills and fever.  HENT: Negative for ear pain and hearing loss.        On going history   Eyes: Negative for blurred vision and double vision.  Respiratory: Negative for cough, shortness of breath and wheezing.   Cardiovascular: Negative for chest pain, palpitations and leg swelling.  Gastrointestinal: Positive for heartburn. Negative for blood in stool, constipation, diarrhea, nausea and vomiting.       OTC meds help   Genitourinary: Negative for dysuria, frequency and urgency.  Musculoskeletal: Negative for back pain, joint pain and neck pain.  Skin: Negative for rash.  Neurological: Negative for seizures, weakness and headaches.  Endo/Heme/Allergies: Does not bruise/bleed easily.  Psychiatric/Behavioral: Negative for depression and suicidal ideas. The patient does not have insomnia.    The following were reviewed and entered/updated in epic: Past Medical History:  Diagnosis Date  . Allergy   . Anxiety   . Chicken pox   . Chronic headaches   . Depression   . GERD (gastroesophageal reflux disease)   . Hyperlipidemia   . Hypertension   . IBS (irritable bowel syndrome)   . Low testosterone   . Obesity   . Syncope    related to IBS and severe nausea, no current problems   Patient Active Problem List   Diagnosis Date Noted  . 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 Hyperglycemia 05/09/2017    Priority: Medium  . Hyperlipidemia 11/04/2016    Priority: Medium  . Hypogonadism male 12/18/2011    Priority: Medium  . HTN (hypertension) 12/18/2011    Priority: Medium  . Migraine headache with aura 12/18/2011    Priority: Medium  . GERD (gastroesophageal reflux disease) 12/18/2011    Priority: Medium  . Irritable bowel syndrome (IBS) 12/18/2011    Priority: Medium  .  Obesity, Class I, BMI 30-34.9 10/22/2015    Priority: Low  . ED (erectile dysfunction) 12/18/2011    Priority: Low  . AR (allergic rhinitis) 12/18/2011    Priority: Low  . Tonsillith 05/06/2019  . AKI (acute kidney injury) ([place]) 05/15/2018   Past Surgical History:  Procedure Laterality Date  . NO PAST SURGERIES      Family History  Problem Relation Age of Onset  . Arthritis Mother        knee replacements and back  . Heart disease Father        22s reportedly- patient no issues  . Benign prostatic hyperplasia Father   . Other Brother        hypogonadism/low T both brothers  . Migraines Brother        bothbrothers  . Prostate cancer Maternal Grandfather   . Diabetes Paternal Grandmother   . Colon cancer Neg Hx   . Rectal cancer Neg Hx   . Stomach cancer Neg Hx   . Esophageal cancer Neg Hx     Medications- reviewed and updated Current Outpatient Medications  Medication Sig Dispense Refill  . BELLADONNA ALK-PHENOBARBITAL PO Take by mouth. Take 1 tablet by mouth before meals and at bedtime prn for abd cramping.    . butalbital-acetaminophen-caffeine (FIORICET WITH CODEINE) 50-325-40-30 MG capsule TAKE 1 CAPSULE BY MOUTH EVERY 6 HOURS AS NEEDED 15 capsule 5  . dicyclomine (BENTYL) 10 MG capsule Take 1 capsule (10 mg total) by mouth 4 (four) times daily -  before meals and at bedtime. 60 capsule 2  . folic acid (FOLVITE) 259 MCG tablet Take 400 mcg by mouth daily.    . Galcanezumab-gnlm (EMGALITY) 120 MG/ML SOAJ Inject 120 mg into the skin every 30 (thirty) days. 3 pen 3  . hydrochlorothiazide (MICROZIDE) 12.5 MG capsule TAKE 1 CAPSULE BY MOUTH EVERY DAY 90 capsule 3  . Magnesium 250 MG TABS Take 250 mg by mouth daily.     . Multiple Vitamin (MULITIVITAMIN WITH MINERALS) TABS Take 1 tablet by mouth daily.    . nortriptyline (PAMELOR) 25 MG capsule Take 3 capsules (75 mg total) by mouth at bedtime. 270 capsule 3  . omeprazole (PRILOSEC) 20 MG capsule Take 1 capsule (20 mg total)  by mouth daily. 90 capsule 3  . ondansetron (ZOFRAN) 8 MG tablet TAKE 1 TABLET (8 MG TOTAL) BY MOUTH EVERY 8 (EIGHT) HOURS AS NEEDED. 9 tablet 11  . Potassium Bicarbonate 99 MG CAPS Take 1 capsule by mouth daily.    . Tadalafil 2.5 MG TABS TAKE 1 TABLET DAILY 90 tablet 3  . Testosterone 20.25 MG/ACT (1.62%) GEL APPLY 4 PUMPS TOPICALLY DAILY 450 g 2  . Ubrogepant (UBRELVY) 100 MG TABS Take 1 tablet by mouth as needed (May repeat dose in 2 hours.  Maximum 2 tablets in 24 hours). 10 tablet 3  . vitamin B-12 (CYANOCOBALAMIN) 1000 MCG tablet Take 1,000 mcg by mouth daily.    . busPIRone (BUSPAR) 7.5 MG tablet Take 1 tablet (7.5 mg total) by mouth 2 (two) times daily as needed. 60 tablet 1   No current facility-administered medications for this visit.    Allergies-reviewed and updated Allergies  Allergen Reactions  . Sulfa Antibiotics Swelling    Eyes became swollen and ear became swollen (localized)  . Milk-Related Compounds Other (See Comments)    Intense abdominal pain and passing out if he consumes over 4 ounces of milk , pt can have ice cream , only happens with plain milk, can have it in cakes, etc.  . Nickel Rash    Social History   Social History Narrative   Married wife Hilda Blades. No children.       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 Very busy at work. Enjoys Engineer, site. Household projects and Architect.       Caffeine 1-2 sodas day/ exercise a little/ R handed   One story home   Objective  Objective:  BP (!) 150/88   Pulse (!) 113   Temp 98.6 F (37 C) (Temporal)   Ht '5\' 8"'  (1.727 m)   Wt 221 lb 3.2 oz (100.3 kg)   SpO2 96%   BMI 33.63 kg/m  Gen: NAD, resting comfortably HEENT: Mucous membranes are moist. Oropharynx normal Neck: no thyromegaly CV: Slightly tachycardic but regular no murmurs rubs or gallops Lungs: CTAB no crackles, wheeze, rhonchi Abdomen: soft/nontender/nondistended/normal bowel sounds. No rebound or guarding.   Ext: no edema Skin: warm, dry Neuro: grossly normal, moves all extremities, PERRLA   Assessment and Plan  49 y.o. male presenting for annual physical.  Health Maintenance counseling: 1. Anticipatory guidance: Patient counseled regarding regular dental exams -last appointment 2 yrs encouraged q6 months, eye exams - every 1-3yearly with no recent vision issues,  avoiding smoking and second hand smoke , limiting alcohol to 2 beverages per day -  Rarely 1 drink 4 times a year.  2. Risk factor reduction:  Advised patient of need for regular exercise and  diet rich and fruits and vegetables to reduce risk of heart attack and stroke. Exercise-  Not routinely- enocuraged 150 mins per week . Diet-. Tries to limit portions, increasing veggies as plan- home #s trending down- down 6 lbs from last visit Wt Readings from Last 3 Encounters:  12/31/19 221 lb 3.2 oz (100.3 kg)  10/23/19 215 lb (97.5 kg)  08/23/19 222 lb 3.2 oz (100.8 kg)  3. Immunizations/screenings/ancillary studies- declines covid 19 vaccine for now.  Declines shingrix for now.  Immunization History  Administered Date(s) Administered  . Tdap 08/19/2008, 01/14/2015  4. Prostate cancer screening-low risk trend for prostate cancer based on prior PSAs-update with blood work again today.  Defer rectal exam unless worrisome PSA trend Lab Results  Component Value Date   PSA 0.78 05/06/2019   PSA 1.31 05/02/2017   PSA 0.74 01/14/2015   5. Colon cancer screening -  last one done 08/2019 with 10 year repeat 6. Skin cancer screening- advised regular sunscreen use. Denies worrisome, changing, or new skin lesions. Does not see dermatology.  7. never smoker  Status of chronic or acute concerns   See separate problem-oriented charting  Gastroesophageal reflux disease without esophagitis -compliant with omeprazole (PRILOSEC) 20 MG capsule. Reasonable control of symptoms. Last b12 ok. pepcid has not helped in past. Discussed possible TIF procedure-  defer for now.  Lab Results  Component Value Date   VITAMINB12 >1500 (H) 05/06/2019   Intractable episodic tension-type headache- started on emgality back in October. Has been reducing frequency and helped symptoms be lower intensity then got into allergy season and has caused more minor symptoms. Also on fiorcet when he has migraines and nortriptyline 25 mg to reduce frequency of migraines. Overall pleased- continue current meds and neurology follow up  #hyperlipidemia S: Medication:none  Lab Results  Component Value Date   CHOL 185 05/06/2019   HDL 38.40 (L) 05/06/2019   LDLCALC 84 01/14/2015   LDLDIRECT 104.0 05/06/2019   TRIG 331.0 (H) 05/06/2019   CHOLHDL 5 05/06/2019   A/P: last labs were fasting. 10 year ASCVD risk of only 6.3% so not at a point we would have to start medication yet-I suspect numbers would be even better with his at home blood pressure readings- so I think his risk is even lower  # Hyperglycemia/insulin resistance/prediabetes S:  Medication: none Exercise and diet- hit and miss on exercise lately- had been better in fall but worsened over the holidays. Dietary trying to do more salads in the week and increase veggies- decreasing unhealthy foods Lab Results  Component Value Date   HGBA1C 5.7 05/06/2019   HGBA1C 5.6 01/14/2015   A/P: A1c was in prediabetes range last visit.  We have discussed the importance of healthy eating/regular exercise/weight loss-he has already been trying to increase his vegetable intake.  Exercise has been more challenging-encouraged trying to get in 150 minutes total every week if possible  Other male erectile dysfunction- good control with cialis and testosterone combination  IBS- reasonable control on bentyl  As needed  Recommended follow up: Return in about 6 months (around 07/01/2020) for follow up- or sooner if needed. Future Appointments  Date Time Provider [place]  01/14/2020  8:30 AM [facility]   03/23/2020 10:10 AM Pieter Partridge, DO [facility] None  07/02/2020  3:00 PM Marin Olp, MD [facility]    Lab/Order associations:Not  Fasting- will return fasting   ICD-10-CM   1. Encounter for preventative adult health care exam with  abnormal findings  Z00.01   2. Essential hypertension  I10 hydrochlorothiazide (MICROZIDE) 12.5 MG capsule  3. Hypogonadism male  E29.1 Testosterone  4. Anxiety  F41.9   5. Gastroesophageal reflux disease without esophagitis  K21.9 omeprazole (PRILOSEC) 20 MG capsule  6. Intractable episodic tension-type headache  G44.211   7. Hyperlipidemia, unspecified hyperlipidemia type  E78.5 CBC with Differential/Platelet    Comprehensive metabolic panel    Lipid panel  8. Hyperglycemia  R73.9 Hemoglobin A1c  9. Other male erectile dysfunction  N52.8   10. Migraine with aura and without status migrainosus, not intractable  G43.109   11. Screening for prostate cancer  Z12.5 PSA    Meds ordered this encounter  Medications  . dicyclomine (BENTYL) 10 MG capsule    Sig: Take 1 capsule (10 mg total) by mouth 4 (four) times daily -  before meals and at bedtime.    Dispense:  60 capsule    Refill:  2  . hydrochlorothiazide (MICROZIDE) 12.5 MG capsule    Sig: TAKE 1 CAPSULE BY MOUTH EVERY DAY    Dispense:  90 capsule    Refill:  3  . omeprazole (PRILOSEC) 20 MG capsule    Sig: Take 1 capsule (20 mg total) by mouth daily.    Dispense:  90 capsule    Refill:  3  . Tadalafil 2.5 MG TABS    Sig: TAKE 1 TABLET DAILY    Dispense:  90 tablet    Refill:  3    Return precautions advised.  Garret Reddish, MD

## 2019-12-31 NOTE — Progress Notes (Signed)
Phone [phone] In person visit   Subjective:   Steven Holland is a 49 y.o. year old very pleasant male patient who presents for/with See problem oriented charting  This visit occurred during the SARS-CoV-2 public health emergency.  Safety protocols were in place, including screening questions prior to the visit, additional usage of staff PPE, and extensive cleaning of exam room while observing appropriate contact time as indicated for disinfecting solutions.   Past Medical History-  Patient Active Problem List   Diagnosis Date Noted  . 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 Hyperglycemia 05/09/2017    Priority: Medium  . Hyperlipidemia 11/04/2016    Priority: Medium  . Hypogonadism male 12/18/2011    Priority: Medium  . HTN (hypertension) 12/18/2011    Priority: Medium  . Migraine headache with aura 12/18/2011    Priority: Medium  . GERD (gastroesophageal reflux disease) 12/18/2011    Priority: Medium  . Irritable bowel syndrome (IBS) 12/18/2011    Priority: Medium  . Obesity, Class I, BMI 30-34.9 10/22/2015    Priority: Low  . ED (erectile dysfunction) 12/18/2011    Priority: Low  . AR (allergic rhinitis) 12/18/2011    Priority: Low  . Tonsillith 05/06/2019  . AKI (acute kidney injury) ([facility]) 05/15/2018    Medications- reviewed and updated Current Outpatient Medications  Medication Sig Dispense Refill  . BELLADONNA ALK-PHENOBARBITAL PO Take by mouth. Take 1 tablet by mouth before meals and at bedtime prn for abd cramping.    . butalbital-acetaminophen-caffeine (FIORICET WITH CODEINE) 50-325-40-30 MG capsule TAKE 1 CAPSULE BY MOUTH EVERY 6 HOURS AS NEEDED 15 capsule 5  . dicyclomine (BENTYL) 10 MG capsule Take 1 capsule (10 mg total) by mouth 4 (four) times daily -  before meals and at bedtime. 60 capsule 2  . folic acid (FOLVITE) 174 MCG tablet Take 400 mcg by mouth daily.    . Galcanezumab-gnlm (EMGALITY) 120 MG/ML SOAJ Inject 120 mg into the  skin every 30 (thirty) days. 3 pen 3  . hydrochlorothiazide (MICROZIDE) 12.5 MG capsule TAKE 1 CAPSULE BY MOUTH EVERY DAY 90 capsule 3  . Magnesium 250 MG TABS Take 250 mg by mouth daily.     . Multiple Vitamin (MULITIVITAMIN WITH MINERALS) TABS Take 1 tablet by mouth daily.    . nortriptyline (PAMELOR) 25 MG capsule Take 3 capsules (75 mg total) by mouth at bedtime. 270 capsule 3  . omeprazole (PRILOSEC) 20 MG capsule Take 1 capsule (20 mg total) by mouth daily. 90 capsule 3  . ondansetron (ZOFRAN) 8 MG tablet TAKE 1 TABLET (8 MG TOTAL) BY MOUTH EVERY 8 (EIGHT) HOURS AS NEEDED. 9 tablet 11  . Potassium Bicarbonate 99 MG CAPS Take 1 capsule by mouth daily.    . Tadalafil 2.5 MG TABS TAKE 1 TABLET DAILY 90 tablet 3  . Testosterone 20.25 MG/ACT (1.62%) GEL APPLY 4 PUMPS TOPICALLY DAILY 450 g 2  . Ubrogepant (UBRELVY) 100 MG TABS Take 1 tablet by mouth as needed (May repeat dose in 2 hours.  Maximum 2 tablets in 24 hours). 10 tablet 3  . vitamin B-12 (CYANOCOBALAMIN) 1000 MCG tablet Take 1,000 mcg by mouth daily.    . busPIRone (BUSPAR) 7.5 MG tablet Take 1 tablet (7.5 mg total) by mouth 2 (two) times daily as needed. 60 tablet 1   No current facility-administered medications for this visit.     Objective:  BP (!) 150/88   Pulse (!) 113   Temp 98.6 F (37  C) (Temporal)   Ht '5\' 8"'  (1.727 m)   Wt 221 lb 3.2 oz (100.3 kg)   SpO2 96%   BMI 33.63 kg/m  Gen: NAD, resting comfortably    Assessment and Plan   #Anxiety state S: Patient with intermittent issues with anxiety over the years.  In past was given prescription for Celexa 74m. He would only take as needed for a 2 days to a week as needed. Felt that this was helpful for him during high stress situations.  Too sleepy with 20 mg. Had worked with Dr. DLaney PastorA/P: Anxiety state with poor control intermittently.  It sounds like a as needed option would be reasonable.  I told patient I did not think Celexa was a good long-term as needed  solution.  We will trial buspirone 7.5 mg twice a day as needed.  I may further increase to 1.5 tablets of this potentially. -Could consider hydroxyzine as well the patient is concerned about being overly sedated  #Hypogonadism S: Patient is compliant with max dose testosterone gel.  Even despite his testosterone levels were slightly low last year Lab Results  Component Value Date   TESTOSTERONE 208.70 (L) 05/06/2019  A/P: Patient feels like his primary symptom of erectile dysfunction/low libido is controlled with testosterone gel despite low levels-offered converting to injections but he would like to hold off at this time.  Update testosterone level with fasting labs  #hypertension S: medication: Hydrochlorothiazide 12.5 mg Home readings #s: 130s over 70s. BP Readings from Last 3 Encounters:  12/31/19 (!) 150/88  08/23/19 126/84  05/06/19 (!) 142/94  A/P: Poor control today.  Well-controlled at home.  well-controlled on last check.  Patient is going to monitor at home and if remains elevated he will let uKoreaknow.  He feels like he has a migraine coming on-barometric pressure is dropping which is a common trigger for him.  Continue hydrochlorothiazide 12.5 mg for now   Recommended follow up: Return in about 6 months (around 07/01/2020) for follow up- or sooner if needed. Future Appointments  Date Time Provider DMcClure 01/14/2020  8:30 AM [facility]  03/23/2020 10:10 AM JPieter Partridge DO [facility] None  07/02/2020  3:00 PM HYong ChannelSBrayton Mars MD [facility]    Lab/Order associations:   ICD-10-CM   2. Essential hypertension  I10 hydrochlorothiazide (MICROZIDE) 12.5 MG capsule  3. Hypogonadism male  E29.1 Testosterone  4. Anxiety  F41.9     Meds ordered this encounter  . busPIRone (BUSPAR) 7.5 MG tablet    Sig: Take 1 tablet (7.5 mg total) by mouth 2 (two) times daily as needed.    Dispense:  60 tablet    Refill:  1   Return precautions advised.  SGarret Reddish MD

## 2020-01-09 ENCOUNTER — Other Ambulatory Visit: Payer: Self-pay | Admitting: Family Medicine

## 2020-01-14 ENCOUNTER — Other Ambulatory Visit: Payer: BC Managed Care – PPO

## 2020-01-24 ENCOUNTER — Other Ambulatory Visit: Payer: Self-pay | Admitting: Family Medicine

## 2020-03-06 ENCOUNTER — Telehealth: Payer: Self-pay

## 2020-03-06 NOTE — Telephone Encounter (Signed)
Steven Dorma Russell KeyElisabeth Holland - PA Case ID: [id number] Need help? Call us at [phone] Outcome Approvedtoday CaseId:[id number];Status:Approved;Review Type:Prior Auth;Coverage Start Date:02/05/2020;Coverage End Date:03/06/2021; Drug Tadalafil 2.5MG  tablets Form [employer] Electronic PA Form [phone] NCPDP)

## 2020-03-10 ENCOUNTER — Encounter: Payer: Self-pay | Admitting: Neurology

## 2020-03-10 NOTE — Progress Notes (Signed)
Steven Holland (Key: [id number]) Aimovig 140MG /ML auto-injectors   Form [employer] Electronic PA Form [phone] NCPDP) Created 6 hours ago Sent to Plan 3 minutes ago Plan Response 3 minutes ago Submit Clinical Questions 1 minute ago Determination Favorable less than a minute ago Message from Plan CaseId:[id number];Status:Approved;Review Type:Prior Auth;Coverage Start Date:02/09/2020;Coverage End Date:03/10/2021;

## 2020-03-23 ENCOUNTER — Ambulatory Visit: Payer: BC Managed Care – PPO | Admitting: Neurology

## 2020-04-01 ENCOUNTER — Other Ambulatory Visit: Payer: Self-pay | Admitting: Neurology

## 2020-05-08 ENCOUNTER — Encounter: Payer: Self-pay | Admitting: Neurology

## 2020-05-08 NOTE — Progress Notes (Signed)
Eran Voller (Key: [id number]) Bernita Raisin 100MG  tablets   Form [employer] Electronic PA Form [phone] NCPDP) Created 16 hours ago Sent to Plan 3 minutes ago Plan Response 2 minutes ago Submit Clinical Questions 1 minute ago Determination Favorable less than a minute ago Message from Plan CaseId:[id number];Status:Approved;Review Type:Prior Auth;Coverage Start Date:04/08/2020;Coverage End Date:05/08/2021;

## 2020-05-19 NOTE — Progress Notes (Signed)
NEUROLOGY FOLLOW UP OFFICE NOTE  Steven Holland [id number]  HISTORY OF PRESENT ILLNESS: Steven Holland a 49 year old man who follows up for migraines.Marland Kitchen  UPDATE: Nortriptyline was increased last visit.  He was prescribed Nurtec to see if we can abort his migraines quicker.  It was ineffective.  Earlier this year, he was having more mild stress headaches.  Since starting buspirone, those minor headaches improved.    Intensity:Now usually 2-3/10 (no higher than 5/10) Duration: Manageable within 2 hours with Fioricet with codeine; 4 hours with [name] Frequency: 16 headache days in past 30 days.  None were severe.  Last severe migraine was 7/22. Frequency of abortive medication:Fioricet with codeine7 days out of past 30 days.   Current NSAIDS:none Current analgesics:Fioricetwith codeine Current triptans:none Current ergotamine:None Current anti-emetic:Zofran 8 mg Current muscle relaxants:None Current anti-anxiolytic:buspirone Current sleep aide:None Current Antihypertensive medications:Hydrochlorothiazide Current Antidepressant medications:Nortriptyline79m at bedtime Current Anticonvulsant medications:None Current anti-CGRP:Emgality; Ubrelvy 1049mCurrent Vitamins/Herbal/Supplements:B12, MVI, Mg 25700FVaily, folic acid Current Antihistamines/Decongestants:None Hormones: testosterone  Caffeine:1 or less Cokes per day Alcohol:Rarely Smoker:No Diet:Drinks over two 16 ounce bottles of water daily Exercise:Rides an elliptical about 3 times a week Depression:No; Anxiety:No Other pain:no Sleep hygiene:5 hours a night  HISTORY: Onset: Since early 20s. Location: Back of head/neck or bi-temporal.  Quality: pounding Initial intensity: Varies from 2-3/10 to 5-6/10, rarely 7-8/10 Aura: no Prodrome: no Postdrome: no Associated symptoms: Nausea, photophobia, phonophobia. He denies thunderclap headache. Initial Duration:  All day or days if not pain reliever. Otherwise, 2 to 4 hours. Sometimes wakes up with headache Initial Frequency: 4-5 days a week. Initial Frequency of abortive medication: 4-5 days a week Triggers: Change of barometric pressure, allergies Relieving factors: no Activity: aggravates  Past NSAIDS: ibuprofen, Sprix NS (effective 50% of time but burns) Past analgesics: Tylenol, vicodin (worked) Past abortive triptans: Sumatriptan tablet, Maxalt,Relpax, Zembrace (all ineffective) Past ergots: Migranal Past muscle relaxants: no Past anti-emetic: no Past antihypertensive medications: bisoprolol Past antidepressant medications: no Past anticonvulsant medications: topiramate 10041mparesthesias, impotence) Past CGRP inhibitor:  Aimovig 140m43murtec Past vitamins/Herbal/Supplements: no Other past therapies: no  Family history of headache: Mother's side  PAST MEDICAL HISTORY: Past Medical History:  Diagnosis Date  . Allergy   . Anxiety   . Chicken pox   . Chronic headaches   . Depression   . GERD (gastroesophageal reflux disease)   . Hyperlipidemia   . Hypertension   . IBS (irritable bowel syndrome)   . Low testosterone   . Obesity   . Syncope    related to IBS and severe nausea, no current problems    MEDICATIONS: Current Outpatient Medications on File Prior to Visit  Medication Sig Dispense Refill  . butalbital-acetaminophen-caffeine (FIORICET WITH CODEINE) 50-325-40-30 MG capsule TAKE 1 CAPSULE BY MOUTH EVERY 6 HOURS AS NEEDED 15 capsule 0  . BELLADONNA ALK-PHENOBARBITAL PO Take by mouth. Take 1 tablet by mouth before meals and at bedtime prn for abd cramping.    . busPIRone (BUSPAR) 7.5 MG tablet TAKE 1 TABLET BY MOUTH 2 TIMES DAILY AS NEEDED. 180 tablet 1  . dicyclomine (BENTYL) 10 MG capsule Take 1 capsule (10 mg total) by mouth 4 (four) times daily -  before meals and at bedtime. 60 capsule 2  . folic acid (FOLVITE) 400 494 tablet Take 400 mcg by mouth  daily.    . Galcanezumab-gnlm (EMGALITY) 120 MG/ML SOAJ Inject 120 mg into the skin every 30 (thirty) days. 3 pen 3  . hydrochlorothiazide (MICROZIDE) 12.5 MG capsule  TAKE 1 CAPSULE BY MOUTH EVERY DAY 90 capsule 3  . Magnesium 250 MG TABS Take 250 mg by mouth daily.     . Multiple Vitamin (MULITIVITAMIN WITH MINERALS) TABS Take 1 tablet by mouth daily.    . nortriptyline (PAMELOR) 25 MG capsule Take 3 capsules (75 mg total) by mouth at bedtime. 270 capsule 3  . omeprazole (PRILOSEC) 20 MG capsule Take 1 capsule (20 mg total) by mouth daily. 90 capsule 3  . ondansetron (ZOFRAN) 8 MG tablet TAKE 1 TABLET (8 MG TOTAL) BY MOUTH EVERY 8 (EIGHT) HOURS AS NEEDED. 9 tablet 11  . Potassium Bicarbonate 99 MG CAPS Take 1 capsule by mouth daily.    . Tadalafil 2.5 MG TABS TAKE 1 TABLET DAILY 90 tablet 3  . Testosterone 20.25 MG/ACT (1.62%) GEL APPLY 4 PUMPS TOPICALLY DAILY 450 g 2  . Ubrogepant (UBRELVY) 100 MG TABS Take 1 tablet by mouth as needed (May repeat dose in 2 hours.  Maximum 2 tablets in 24 hours). 10 tablet 3  . vitamin B-12 (CYANOCOBALAMIN) 1000 MCG tablet Take 1,000 mcg by mouth daily.     No current facility-administered medications on file prior to visit.    ALLERGIES: Allergies  Allergen Reactions  . Sulfa Antibiotics Swelling    Eyes became swollen and ear became swollen (localized)  . Milk-Related Compounds Other (See Comments)    Intense abdominal pain and passing out if he consumes over 4 ounces of milk , pt can have ice cream , only happens with plain milk, can have it in cakes, etc.  . Nickel Rash    FAMILY HISTORY: Family History  Problem Relation Age of Onset  . Arthritis Mother        knee replacements and back  . Heart disease Father        40s reportedly- patient no issues  . Benign prostatic hyperplasia Father   . Other Brother        hypogonadism/low T both brothers  . Migraines Brother        bothbrothers  . Prostate cancer Maternal Grandfather   . Diabetes  Paternal Grandmother   . Colon cancer Neg Hx   . Rectal cancer Neg Hx   . Stomach cancer Neg Hx   . Esophageal cancer Neg Hx     SOCIAL HISTORY: Social History   Socioeconomic History  . Marital status: Married    Spouse name: Not on file  . Number of children: 0  . Years of education: 4  . Highest education level: Not on file  Occupational History  . Occupation: Engineer, mining  Tobacco Use  . Smoking status: Never Smoker  . Smokeless tobacco: Never Used  Vaping Use  . Vaping Use: Never used  Substance and Sexual Activity  . Alcohol use: Yes    Comment: occasional liquor/wine  . Drug use: No  . Sexual activity: Yes  Other Topics Concern  . Not on file  Social History Narrative   Married wife Hilda Blades. No children.       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 Very busy at work. Enjoys Engineer, site. Household projects and Architect.       Caffeine 1-2 sodas day/ exercise a little/ R handed   One story home   Social Determinants of Health   Financial Resource Strain:   . Difficulty of Paying Living Expenses: Not on file  Food Insecurity:   . Worried About Running  Out of Food in the Last Year: Not on file  . Ran Out of Food in the Last Year: Not on file  Transportation Needs:   . Lack of Transportation (Medical): Not on file  . Lack of Transportation (Non-Medical): Not on file  Physical Activity:   . Days of Exercise per Week: Not on file  . Minutes of Exercise per Session: Not on file  Stress:   . Feeling of Stress : Not on file  Social Connections:   . Frequency of Communication with Friends and Family: Not on file  . Frequency of Social Gatherings with Friends and Family: Not on file  . Attends Religious Services: Not on file  . Active Member of Clubs or Organizations: Not on file  . Attends Archivist Meetings: Not on file  . Marital Status: Not on file  Intimate Partner Violence:   . Fear of Current  or Ex-Partner: Not on file  . Emotionally Abused: Not on file  . Physically Abused: Not on file  . Sexually Abused: Not on file    PHYSICAL EXAM: Blood pressure (!) 150/106, pulse 74, resp. rate 18, height _0  (1.727 m), weight 220 lb (99.8 kg), SpO2 99 %. General: No acute distress.  Patient appears well-groomed.   Head:  Normocephalic/atraumatic Eyes:  Fundi examined but not visualized Neck: supple, no paraspinal tenderness, full range of motion Heart:  Regular rate and rhythm Lungs:  Clear to auscultation bilaterally Back: No paraspinal tenderness Neurological Exam: alert and oriented to person, place, and time. Attention span and concentration intact, recent and remote memory intact, fund of knowledge intact.  Speech fluent and not dysarthric, language intact.  CN II-XII intact. Bulk and tone normal, muscle strength 5/5 throughout.  Sensation to light touch, temperature and vibration intact.  Deep tendon reflexes 2+ throughout, toes downgoing.  Finger to nose and heel to shin testing intact.  Gait normal, Romberg negative.  IMPRESSION: Chronic migraine without aura, without status migrainsous, not intractable.  Frequency has reduced.  Will try to optimize with increasing nortriptyline.   PLAN: 1.  For preventative management, Emgality monthly; increase nortriptyline to 192m at bedtime 2.  For abortive therapy, Ubrelvy or Fioricet with codeine.   3.  Limit use of Fiorciet to no more than 2 days out of week to prevent risk of rebound or medication-overuse headache.   4.  Keep headache diary 5.  Exercise, hydration, caffeine cessation, sleep hygiene, monitor for and avoid triggers 6.  Follow up 6 months   AMetta Clines DO  CC: SGarret Reddish

## 2020-05-20 ENCOUNTER — Other Ambulatory Visit: Payer: Self-pay

## 2020-05-20 ENCOUNTER — Ambulatory Visit (INDEPENDENT_AMBULATORY_CARE_PROVIDER_SITE_OTHER): Payer: BC Managed Care – PPO | Admitting: Neurology

## 2020-05-20 ENCOUNTER — Encounter: Payer: Self-pay | Admitting: Neurology

## 2020-05-20 VITALS — BP 150/106 | HR 74 | Resp 18 | Ht 68.0 in | Wt 220.0 lb

## 2020-05-20 DIAGNOSIS — G43709 Chronic migraine without aura, not intractable, without status migrainosus: Secondary | ICD-10-CM

## 2020-05-20 MED ORDER — BUTALBITAL-APAP-CAFF-COD 50-325-40-30 MG PO CAPS: ORAL_CAPSULE | ORAL | 0 refills | Status: DC

## 2020-05-20 MED ORDER — NORTRIPTYLINE HCL 50 MG PO CAPS: 100.0000 mg | ORAL_CAPSULE | Freq: Every day | ORAL | 5 refills | Status: DC

## 2020-05-20 MED ORDER — UBRELVY 100 MG PO TABS: 1.0000 | ORAL_TABLET | ORAL | 5 refills | Status: DC | PRN

## 2020-05-20 NOTE — Patient Instructions (Signed)
  1. Continue emgality.  Increase nortriptyline to 100mg  at bedtime 2. Take Ubrelvy 100mg  or Fioricet with codeine as needed/directed. 3. Limit use of pain relievers to no more than 2 days out of the week.  These medications include acetaminophen, NSAIDs (ibuprofen/Advil/Motrin, naproxen/Aleve, triptans (Imitrex/sumatriptan), Excedrin, and narcotics.  This will help reduce risk of rebound headaches. 4. Be aware of common food triggers:  - Caffeine:  coffee, black tea, cola, Mt. Dew  - Chocolate  - Dairy:  aged cheeses (brie, blue, cheddar, gouda, [place], provolone, [place], Swiss, etc), chocolate milk, buttermilk, sour cream, limit eggs and yogurt  - Nuts, peanut butter  - Alcohol  - Cereals/grains:  FRESH breads (fresh bagels, sourdough, doughnuts), yeast productions  - Processed/canned/aged/cured meats (pre-packaged deli meats, hotdogs)  - MSG/glutamate:  soy sauce, flavor enhancer, pickled/preserved/marinated foods  - Sweeteners:  aspartame (Equal, Nutrasweet).  Sugar and Splenda are okay  - Vegetables:  legumes (lima beans, lentils, snow peas, fava beans, pinto peans, peas, garbanzo beans), sauerkraut, onions, olives, pickles  - Fruit:  avocados, bananas, citrus fruit (orange, lemon, grapefruit), mango  - Other:  Frozen meals, macaroni and cheese 5. Routine exercise 6. Stay adequately hydrated (aim for 64 oz water daily) 7. Keep headache diary 8. Maintain proper stress management 9. Maintain proper sleep hygiene 10. Do not skip meals 11. Consider supplements:  magnesium citrate 400mg  daily, riboflavin 400mg  daily, coenzyme Q10 100mg  three times daily.

## 2020-05-29 NOTE — Progress Notes (Signed)
Steven Holland Key: [id number] - PA Case ID: [id number] - Rx #: [id number] Need help? Call us at [phone] Outcome Approvedtoday CaseId:[id number];Status:Denied;Review Type:Prior Auth;Appeal Information: Attention:ATTN: CLINICAL APPEALS DEPARTMENT [employer] [id number]. [phone] Phone:[phone] [phone]; Important - Please read the below note on eAppeals: Please reference the denial letter for information on the rights for an appeal, rationale for the denial, and how to submit an appeal including if any information is needed to support the appeal. Note about urgent situations - Generally, an urgent situation is one which, in the opinion of the provider, the health of the patient may be in serious jeopardy or may experience pain that cannot be adequately controlled while waiting for a decision on the appeal.; CaseId:[id number];Status:Approved;Review Type:Prior Auth;Coverage Start Date:04/29/2020;Coverage End Date:05/29/2021; ENIDPO:[id number];WERXVQ:[id number];Review Type:Prior Auth;Coverage Start Date:04/29/2020;Coverage End Date:05/29/2021; Drug Emgality 120MG /ML auto-injectors (migraine) Form [employer] Electronic PA Form (2017 NCPDP) Original Claim Info 17

## 2020-06-13 ENCOUNTER — Other Ambulatory Visit: Payer: Self-pay | Admitting: Neurology

## 2020-06-18 ENCOUNTER — Other Ambulatory Visit: Payer: Self-pay | Admitting: Neurology

## 2020-06-18 ENCOUNTER — Other Ambulatory Visit: Payer: Self-pay | Admitting: Family Medicine

## 2020-06-18 DIAGNOSIS — G44211 Episodic tension-type headache, intractable: Secondary | ICD-10-CM

## 2020-06-23 HISTORY — PX: EYE SURGERY: SHX253

## 2020-06-26 ENCOUNTER — Other Ambulatory Visit: Payer: BC Managed Care – PPO

## 2020-06-26 ENCOUNTER — Other Ambulatory Visit: Payer: Self-pay

## 2020-06-26 DIAGNOSIS — Z125 Encounter for screening for malignant neoplasm of prostate: Secondary | ICD-10-CM

## 2020-06-26 DIAGNOSIS — R739 Hyperglycemia, unspecified: Secondary | ICD-10-CM

## 2020-06-26 DIAGNOSIS — E785 Hyperlipidemia, unspecified: Secondary | ICD-10-CM

## 2020-06-26 DIAGNOSIS — E291 Testicular hypofunction: Secondary | ICD-10-CM

## 2020-06-26 NOTE — Addendum Note (Signed)
Addended by: Altamese Cabal on: 06/26/2020 10:06 AM   Modules accepted: Orders

## 2020-06-27 LAB — COMPREHENSIVE METABOLIC PANEL
AG Ratio: 1.6 (calc) (ref 1.0–2.5)
ALT: 46 U/L (ref 9–46)
AST: 30 U/L (ref 10–35)
Albumin: 4.2 g/dL (ref 3.6–5.1)
Alkaline phosphatase (APISO): 83 U/L (ref 35–144)
BUN: 11 mg/dL (ref 7–25)
CO2: 31 mmol/L (ref 20–32)
Calcium: 9.1 mg/dL (ref 8.6–10.3)
Chloride: 100 mmol/L (ref 98–110)
Creat: 0.88 mg/dL (ref 0.70–1.33)
Globulin: 2.6 g/dL (calc) (ref 1.9–3.7)
Glucose, Bld: 116 mg/dL — ABNORMAL HIGH (ref 65–99)
Potassium: 3.7 mmol/L (ref 3.5–5.3)
Sodium: 139 mmol/L (ref 135–146)
Total Bilirubin: 0.3 mg/dL (ref 0.2–1.2)
Total Protein: 6.8 g/dL (ref 6.1–8.1)

## 2020-06-27 LAB — CBC WITH DIFFERENTIAL/PLATELET
Absolute Monocytes: 563 cells/uL (ref 200–950)
Basophils Absolute: 30 cells/uL (ref 0–200)
Basophils Relative: 0.4 %
Eosinophils Absolute: 218 cells/uL (ref 15–500)
Eosinophils Relative: 2.9 %
HCT: 43.6 % (ref 38.5–50.0)
Hemoglobin: 14.4 g/dL (ref 13.2–17.1)
Lymphs Abs: 2603 cells/uL (ref 850–3900)
MCH: 29.7 pg (ref 27.0–33.0)
MCHC: 33 g/dL (ref 32.0–36.0)
MCV: 89.9 fL (ref 80.0–100.0)
MPV: 9.3 fL (ref 7.5–12.5)
Monocytes Relative: 7.5 %
Neutro Abs: 4088 cells/uL (ref 1500–7800)
Neutrophils Relative %: 54.5 %
Platelets: 228 10*3/uL (ref 140–400)
RBC: 4.85 10*6/uL (ref 4.20–5.80)
RDW: 13.1 % (ref 11.0–15.0)
Total Lymphocyte: 34.7 %
WBC: 7.5 10*3/uL (ref 3.8–10.8)

## 2020-06-27 LAB — HEMOGLOBIN A1C
Hgb A1c MFr Bld: 5.7 % of total Hgb — ABNORMAL HIGH (ref <5.7)
Mean Plasma Glucose: 117 (calc)
eAG (mmol/L): 6.5 (calc)

## 2020-06-27 LAB — LIPID PANEL
Cholesterol: 226 mg/dL — ABNORMAL HIGH (ref <200)
HDL: 45 mg/dL (ref >=40)
LDL Cholesterol (Calc): 141 mg/dL (calc) — ABNORMAL HIGH
Non-HDL Cholesterol (Calc): 181 mg/dL (calc) — ABNORMAL HIGH (ref <130)
Total CHOL/HDL Ratio: 5 (calc) — ABNORMAL HIGH (ref <5.0)
Triglycerides: 261 mg/dL — ABNORMAL HIGH (ref <150)

## 2020-06-27 LAB — TESTOSTERONE: Testosterone: 505 ng/dL (ref 250–827)

## 2020-06-27 LAB — PSA: PSA: 0.7 ng/mL (ref <=4.0)

## 2020-07-01 NOTE — Progress Notes (Signed)
Phone [phone] In person visit   Subjective:   Steven Holland is a 49 y.o. year old very pleasant male patient who presents for/with See problem oriented charting Chief Complaint  Patient presents with  . Hypertension   This visit occurred during the SARS-CoV-2 public health emergency.  Safety protocols were in place, including screening questions prior to the visit, additional usage of staff PPE, and extensive cleaning of exam room while observing appropriate contact time as indicated for disinfecting solutions.   Past Medical History-  Patient Active Problem List   Diagnosis Date Noted  . 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 Anxiety state 12/31/2019    Priority: Medium  . Hyperglycemia 05/09/2017    Priority: Medium  . Hyperlipidemia 11/04/2016    Priority: Medium  . Hypogonadism male 12/18/2011    Priority: Medium  . HTN (hypertension) 12/18/2011    Priority: Medium  . Migraine headache with aura 12/18/2011    Priority: Medium  . GERD (gastroesophageal reflux disease) 12/18/2011    Priority: Medium  . Irritable bowel syndrome (IBS) 12/18/2011    Priority: Medium  . Obesity, Class I, BMI 30-34.9 10/22/2015    Priority: Low  . ED (erectile dysfunction) 12/18/2011    Priority: Low  . AR (allergic rhinitis) 12/18/2011    Priority: Low  . Tonsillith 05/06/2019    Medications- reviewed and updated Current Outpatient Medications  Medication Sig Dispense Refill  . BELLADONNA ALK-PHENOBARBITAL PO Take by mouth. Take 1 tablet by mouth before meals and at bedtime prn for abd cramping.    . busPIRone (BUSPAR) 7.5 MG tablet TAKE 1 TABLET BY MOUTH 2 TIMES DAILY AS NEEDED. (Patient taking differently: Patient is only taken one a day) 180 tablet 1  . butalbital-acetaminophen-caffeine (FIORICET WITH CODEINE) 50-325-40-30 MG capsule TAKE 1 CAPSULE BY MOUTH EVERY 6 HOURS AS NEEDED 15 capsule 0  . dicyclomine (BENTYL) 10 MG capsule Take 1 capsule (10 mg  total) by mouth 4 (four) times daily -  before meals and at bedtime. 60 capsule 2  . folic acid (FOLVITE) 982 MCG tablet Take 400 mcg by mouth daily.    . Galcanezumab-gnlm (EMGALITY) 120 MG/ML SOAJ Inject 120 mg into the skin every 30 (thirty) days. 3 pen 3  . Magnesium 250 MG TABS Take 250 mg by mouth daily.     . Multiple Vitamin (MULITIVITAMIN WITH MINERALS) TABS Take 1 tablet by mouth daily.    . nortriptyline (PAMELOR) 50 MG capsule TAKE 2 CAPSULES (100 MG TOTAL) BY MOUTH AT BEDTIME. 270 capsule 1  . omeprazole (PRILOSEC) 20 MG capsule Take 1 capsule (20 mg total) by mouth daily. 90 capsule 3  . ondansetron (ZOFRAN) 8 MG tablet TAKE 1 TABLET BY MOUTH EVERY 8 HOURS AS NEEDED 9 tablet 11  . Potassium Bicarbonate 99 MG CAPS Take 1 capsule by mouth daily.    . Tadalafil 2.5 MG TABS TAKE 1 TABLET DAILY 90 tablet 3  . Testosterone 20.25 MG/ACT (1.62%) GEL APPLY 4 PUMPS TOPICALLY DAILY 450 g 2  . Ubrogepant (UBRELVY) 100 MG TABS Take 1 tablet by mouth as needed (May repeat dose in 2 hours.  Maximum 2 tablets in 24 hours). 10 tablet 5  . vitamin B-12 (CYANOCOBALAMIN) 1000 MCG tablet Take 1,000 mcg by mouth daily.    . hydrochlorothiazide (HYDRODIURIL) 25 MG tablet Take 1 tablet (25 mg total) by mouth daily. 90 tablet 3   No current facility-administered medications for this visit.  Objective:  BP 130/70   Pulse 95   Temp 97.9 F (36.6 C) (Temporal)   Resp 18   Ht '5\' 8"'  (1.727 m)   Wt 218 lb 12.8 oz (99.2 kg)   SpO2 97%   BMI 33.27 kg/m  Gen: NAD, resting comfortably CV: RRR no murmurs rubs or gallops Lungs: CTAB no crackles, wheeze, rhonchi Abdomen: soft/nontender/nondistended/normal bowel sounds. No rebound or guarding.  Ext: no edema Skin: warm, dry    Assessment and Plan   #hypertension S: medication:  HCTZ 12.5Mg at night.  Home readings #s: 140-150 range over 90-100 at home- previously 130/70s. Wonders if anxiety could be contributing- wife with a lot of stress and he  is tryign to b ethere for him.  BP Readings from Last 3 Encounters:  07/02/20 130/70  05/20/20 (!) 150/106  12/31/19 (!) 150/88  A/P: poor control at home of blood pressure- we will increase hydrochlorothiazide 21m. Recheck 1-2 months and can check potassium at that time.   # GERD S:Medication: Prilosec 20Mg  B12 levels related to PPI use: Lab Results  Component Value Date   VITAMINB12 >1500 (H) 05/06/2019  A/P:good control- continue current meds   #hyperlipidemia S: Medication:none  Lab Results  Component Value Date   CHOL 226 (H) 06/26/2020   HDL 45 06/26/2020   LDLCALC 141 (H) 06/26/2020   LDLDIRECT 104.0 05/06/2019   TRIG 261 (H) 06/26/2020   CHOLHDL 5.0 (H) 06/26/2020   A/P: prefers to work on lifestyle and luckily 10 year ascvd risk only 5.2%- continue to work on lifestyle and recheck in 1 year.   # Anxiety S:Medication: Buspar 7.Mg at night only has been very helpful A/P: singificant improvement- continue current medication   # Low testosterone S:ompliant with 4 pumps of testosterone 20.25 mg/act gel  - night sweats prior to testosterone therapy- no issues since Lab Results  Component Value Date   TESTOSTERONE 505 06/26/2020  A/P: excellent control- continue current medicines  - tadalafil still helpful and does not cause migraines like higher doses did  # Hyperglycemia/insulin resistance/prediabetes S:  Medication: none Exercise and diet- diet has slipped some- salads have slid down some. 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   # continues to follow with Dr. JTomi Likens headaches with more minor headaches but thinks stress related- better since bupirone in frequency- still only down  to 20 a month but usually 2-3 range. Higher levels much better perhaps 8/10 twice a year.   # HM - declines flu shot - he declines covid 19 vaccination   Recommended follow up: Return in about 6 months (around 12/31/2020) for physical or sooner if needed. Future Appointments  Date Time Provider DBolivar 11/12/2020  8:30 AM JMetta ClinesR, DO [facility] None    Lab/Order associations:   ICD-10-CM   1. Primary hypertension  I10   2. Gastroesophageal reflux disease without esophagitis  K21.9   3. Anxiety state  F41.1   4. Hyperlipidemia, unspecified hyperlipidemia type  E78.5     Meds ordered this encounter  Medications  . hydrochlorothiazide (HYDRODIURIL) 25 MG tablet    Sig: Take 1 tablet (25 mg total) by mouth daily.  Dispense:  90 tablet    Refill:  3   Return precautions advised.  Garret Reddish, MD

## 2020-07-01 NOTE — Patient Instructions (Addendum)
Health Maintenance Due  Topic Date Due  . COVID-19 Vaccine (1) Has not received his vaccine. Declines at this time Never done  . INFLUENZA VACCINE Declined in office flu shot today Never done   poor control at home of blood pressure- we will increase hydrochlorothiazide 25mg . Recheck 1-2 months and can check potassium at that time.

## 2020-07-02 ENCOUNTER — Other Ambulatory Visit: Payer: Self-pay

## 2020-07-02 ENCOUNTER — Ambulatory Visit: Payer: BC Managed Care – PPO | Admitting: Family Medicine

## 2020-07-02 ENCOUNTER — Encounter: Payer: Self-pay | Admitting: Family Medicine

## 2020-07-02 VITALS — BP 130/70 | HR 95 | Temp 97.9°F | Resp 18 | Ht 68.0 in | Wt 218.8 lb

## 2020-07-02 DIAGNOSIS — Z1159 Encounter for screening for other viral diseases: Secondary | ICD-10-CM

## 2020-07-02 DIAGNOSIS — K219 Gastro-esophageal reflux disease without esophagitis: Secondary | ICD-10-CM | POA: Diagnosis not present

## 2020-07-02 DIAGNOSIS — F411 Generalized anxiety disorder: Secondary | ICD-10-CM | POA: Diagnosis not present

## 2020-07-02 DIAGNOSIS — E785 Hyperlipidemia, unspecified: Secondary | ICD-10-CM

## 2020-07-02 DIAGNOSIS — I10 Essential (primary) hypertension: Secondary | ICD-10-CM | POA: Diagnosis not present

## 2020-07-02 MED ORDER — HYDROCHLOROTHIAZIDE 25 MG PO TABS: 25.0000 mg | ORAL_TABLET | Freq: Every day | ORAL | 3 refills | Status: DC

## 2020-07-23 ENCOUNTER — Other Ambulatory Visit: Payer: Self-pay | Admitting: Neurology

## 2020-07-25 ENCOUNTER — Other Ambulatory Visit: Payer: Self-pay | Admitting: Family Medicine

## 2020-10-18 ENCOUNTER — Other Ambulatory Visit: Payer: Self-pay | Admitting: Neurology

## 2020-10-29 ENCOUNTER — Other Ambulatory Visit: Payer: Self-pay | Admitting: Neurology
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
## 2020-11-12 ENCOUNTER — Other Ambulatory Visit (INDEPENDENT_AMBULATORY_CARE_PROVIDER_SITE_OTHER): Payer: BC Managed Care – PPO

## 2020-11-12 ENCOUNTER — Telehealth: Payer: Self-pay

## 2020-11-12 ENCOUNTER — Ambulatory Visit (INDEPENDENT_AMBULATORY_CARE_PROVIDER_SITE_OTHER): Payer: BC Managed Care – PPO | Admitting: Neurology

## 2020-11-12 ENCOUNTER — Encounter: Payer: Self-pay | Admitting: Neurology

## 2020-11-12 ENCOUNTER — Other Ambulatory Visit: Payer: Self-pay

## 2020-11-12 VITALS — BP 140/96 | HR 100 | Ht 68.0 in | Wt 219.8 lb

## 2020-11-12 DIAGNOSIS — M542 Cervicalgia: Secondary | ICD-10-CM | POA: Diagnosis not present

## 2020-11-12 DIAGNOSIS — G43709 Chronic migraine without aura, not intractable, without status migrainosus: Secondary | ICD-10-CM | POA: Diagnosis not present

## 2020-11-12 DIAGNOSIS — H93A9 Pulsatile tinnitus, unspecified ear: Secondary | ICD-10-CM

## 2020-11-12 LAB — TSH: TSH: 1.02 u[IU]/mL (ref 0.35–4.50)

## 2020-11-12 NOTE — Patient Instructions (Signed)
1.  Let me know if you would like to start Vyepti or Botox.  Continue Emgality and nortriptyline for now. 2.  CTA of head and neck and TSH 3.  Refer to Dr. Antoine Primas of Sports Medicine for neck pain 4.  Limit use of pain relievers to no more than 2 days out of week to prevent risk of rebound or medication-overuse headache. 5.  Keep headache diary 6.  Follow up 6 months.

## 2020-11-12 NOTE — Telephone Encounter (Signed)
-----   Message from Drema Dallas, DO sent at 11/12/2020  2:46 PM EST ----- TSH is normal

## 2020-11-12 NOTE — Telephone Encounter (Signed)
Patient returned your call he said that he would call you back when he was offered your VM

## 2020-11-12 NOTE — Telephone Encounter (Signed)
Called patient and left a message for a call back.  

## 2020-11-13 NOTE — Telephone Encounter (Signed)
Called patient and left a message for a call back.  

## 2020-11-16 NOTE — Telephone Encounter (Signed)
Called patient and left a message for a call back in regards to his labs.

## 2020-11-25 ENCOUNTER — Other Ambulatory Visit: Payer: Self-pay | Admitting: Family Medicine

## 2020-11-25 DIAGNOSIS — K219 Gastro-esophageal reflux disease without esophagitis: Secondary | ICD-10-CM
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
## 2020-12-01 ENCOUNTER — Other Ambulatory Visit: Payer: Self-pay

## 2020-12-01 ENCOUNTER — Ambulatory Visit (INDEPENDENT_AMBULATORY_CARE_PROVIDER_SITE_OTHER): Payer: BC Managed Care – PPO

## 2020-12-01 ENCOUNTER — Ambulatory Visit (INDEPENDENT_AMBULATORY_CARE_PROVIDER_SITE_OTHER): Payer: BC Managed Care – PPO | Admitting: Family Medicine

## 2020-12-01 ENCOUNTER — Encounter: Payer: Self-pay | Admitting: Family Medicine

## 2020-12-01 VITALS — BP 120/90 | HR 96 | Ht 68.0 in | Wt 222.0 lb

## 2020-12-01 DIAGNOSIS — G43109 Migraine with aura, not intractable, without status migrainosus: Secondary | ICD-10-CM | POA: Diagnosis not present

## 2020-12-01 DIAGNOSIS — M542 Cervicalgia: Secondary | ICD-10-CM

## 2020-12-01 DIAGNOSIS — M999 Biomechanical lesion, unspecified: Secondary | ICD-10-CM

## 2020-12-01 NOTE — Patient Instructions (Addendum)
Good to see you Neck xray Exercises for the upper back  CO Q10 100-200 mg daily Note for adjustable standing desk Write [place] when you get CT done See me again in 5-6 weeks

## 2020-12-01 NOTE — Assessment & Plan Note (Signed)
   Decision today to treat with OMT was based on Physical Exam  After verbal consent patient was treated with HVLA, ME techniques in  thoracic,areas, all areas are chronic   Patient tolerated the procedure well with improvement in symptoms  Patient given exercises, stretches and lifestyle modifications  See medications in patient instructions if given  Patient will follow up in 4-8 weeks
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
## 2020-12-24 ENCOUNTER — Ambulatory Visit
Admission: RE | Admit: 2020-12-24 | Discharge: 2020-12-24 | Disposition: A | Discharge status: Home / Self Care | Payer: BC Managed Care – PPO | Source: Ambulatory Visit | Attending: Neurology | Admitting: Neurology

## 2020-12-24 DIAGNOSIS — H93A9 Pulsatile tinnitus, unspecified ear: Secondary | ICD-10-CM

## 2020-12-24 MED ORDER — IOPAMIDOL (ISOVUE-370) INJECTION 76%
75.0000 mL | Freq: Once | INTRAVENOUS | Status: AC | PRN
  Administered 2020-12-24: 75 mL via INTRAVENOUS

## 2020-12-25 ENCOUNTER — Other Ambulatory Visit: Payer: Self-pay | Admitting: Family Medicine

## 2020-12-25 ENCOUNTER — Telehealth: Payer: Self-pay

## 2020-12-25 NOTE — Telephone Encounter (Signed)
-----   Message from Drema Dallas, DO sent at 12/25/2020  7:45 AM EDT ----- No concerning findings of the blood vessels.  There is mild plaque in the carotid arteries in the neck.  Recommend optimizing cholesterol control.

## 2020-12-25 NOTE — Telephone Encounter (Signed)
Spoke with pt informed him that No concerning findings of the blood vessels. There is mild plaque in the carotid arteries in the neck. Recommend optimizing cholesterol control, call PCP to talk about cholesterol

## 2021-01-04 ENCOUNTER — Encounter: Payer: BC Managed Care – PPO | Admitting: Family Medicine

## 2021-01-04 ENCOUNTER — Other Ambulatory Visit: Payer: Self-pay | Admitting: Neurology

## 2021-01-11 NOTE — Progress Notes (Signed)
[facility] [address] [place] [address] Phone: [phone] Subjective:   I Steven Holland am serving as a Neurosurgeon for Dr. Antoine Primas.  This visit occurred during the SARS-CoV-2 public health emergency.  Safety protocols were in place, including screening questions prior to the visit, additional usage of staff PPE, and extensive cleaning of exam room while observing appropriate contact time as indicated for disinfecting solutions.   I'm seeing this patient by the request  of:  Shelva Majestic, MD  CC: Neck pain and headache follow-up  RKY:[id number]  [name] is a 50 y.o. male coming in with complaint of back and neck pain. OMT 12/01/2020. Patient states he is still having stiffness and headaches.   Medications patient has been prescribed: None          Reviewed prior external information including notes and imaging from previsou exam, outside providers and external EMR if available.   As well as notes that were available from care everywhere and other healthcare systems.  Past medical history, social, surgical and family history all reviewed in electronic medical record.  No pertanent information unless stated regarding to the chief complaint.   Past Medical History:  Diagnosis Date  . Allergy   . Anxiety   . Chicken pox   . Chronic headaches   . Depression   . GERD (gastroesophageal reflux disease)   . Hyperlipidemia   . Hypertension   . IBS (irritable bowel syndrome)   . Low testosterone   . Obesity   . Syncope    related to IBS and severe nausea, no current problems    Allergies  Allergen Reactions  . Sulfa Antibiotics Swelling    Eyes became swollen and ear became swollen (localized)  . Milk-Related Compounds Other (See Comments)    Intense abdominal pain and passing out if he consumes over 4 ounces of milk , pt can have ice cream , only happens with plain milk, can have it in cakes, etc.  . Nickel Rash      Review of Systems:  No headache, visual changes, nausea, vomiting, diarrhea, constipation, dizziness, abdominal pain, skin rash, fevers, chills, night sweats, weight loss, swollen lymph nodes, body aches, joint swelling, chest pain, shortness of breath, mood changes. POSITIVE muscle aches  Objective  Blood pressure (!) 158/90, pulse (!) 106, height 5\' 8"  (1.727 m), weight 222 lb (100.7 kg), SpO2 99 %.   General: No apparent distress alert and oriented x3 mood and affect normal, dressed appropriately.  HEENT: Pupils equal, extraocular movements intact  Respiratory: Patient's speak in full sentences and does not appear short of breath  Cardiovascular: No lower extremity edema, non tender, no erythema  Neck exam still has tightness noted.  Seems to be mostly on the left side of the neck.  Patient does have some limited sidebending to the left and lacks last 15 degrees of external rotation.  Negative Spurling's.  Tightness noted in the parascapular region 5-5 strength of the upper extremities  Osteopathic findings  C2 flexed rotated and side bent left T7 extended rotated and side bent left inhaled rib       Assessment and Plan:  Migraine headache with aura Patient continues to have some mild discomfort from time to time.  He continues to have the headaches.  Patient is trying different medications but is still not getting complete resolution.  Working with neurology.  Attempted osteopathic manipulation again and optimistic the patient may make some improvement  in the near future.  Discussed icing regimen.  Increase activity slowly.  Follow-up again in 6 to 8 weeks    Nonallopathic problems  Decision today to treat with OMT was based on Physical Exam  After verbal consent patient was treated with HVLA, ME, FPR techniques in cervical, rib, thoracic areas  Patient tolerated the procedure well with improvement in symptoms  Patient given exercises, stretches and lifestyle  modifications  See medications in patient instructions if given  Patient will follow up in 4-8 weeks      The above documentation has been reviewed and is accurate and complete Judi Saa, DO       Note: This dictation was prepared with Dragon dictation along with smaller phrase technology. Any transcriptional errors that result from this process are unintentional.

## 2021-01-12 ENCOUNTER — Encounter: Payer: Self-pay | Admitting: Family Medicine

## 2021-01-12 ENCOUNTER — Other Ambulatory Visit: Payer: Self-pay

## 2021-01-12 ENCOUNTER — Ambulatory Visit (INDEPENDENT_AMBULATORY_CARE_PROVIDER_SITE_OTHER): Payer: BC Managed Care – PPO | Admitting: Family Medicine

## 2021-01-12 VITALS — BP 158/90 | HR 106 | Ht 68.0 in | Wt 222.0 lb

## 2021-01-12 DIAGNOSIS — G43109 Migraine with aura, not intractable, without status migrainosus: Secondary | ICD-10-CM | POA: Diagnosis not present

## 2021-01-12 DIAGNOSIS — M9901 Segmental and somatic dysfunction of cervical region: Secondary | ICD-10-CM

## 2021-01-12 DIAGNOSIS — M9908 Segmental and somatic dysfunction of rib cage: Secondary | ICD-10-CM

## 2021-01-12 DIAGNOSIS — M9902 Segmental and somatic dysfunction of thoracic region: Secondary | ICD-10-CM

## 2021-01-12 NOTE — Patient Instructions (Addendum)
Medical records [id number] Got more moevement in neck today See me again in 6-8 weeks

## 2021-01-12 NOTE — Assessment & Plan Note (Signed)
Patient continues to have some mild discomfort from time to time.  He continues to have the headaches.  Patient is trying different medications but is still not getting complete resolution.  Working with neurology.  Attempted osteopathic manipulation again and optimistic the patient may make some improvement in the near future.  Discussed icing regimen.  Increase activity slowly.  Follow-up again in 6 to 8 weeks

## 2021-01-16 ENCOUNTER — Other Ambulatory Visit: Payer: Self-pay | Admitting: Family Medicine

## 2021-01-26 ENCOUNTER — Other Ambulatory Visit: Payer: Self-pay | Admitting: Neurology

## 2021-02-05 ENCOUNTER — Other Ambulatory Visit: Payer: Self-pay | Admitting: Neurology

## 2021-02-08 NOTE — Telephone Encounter (Signed)
Enough given till next appt, no further refills till seen.

## 2021-02-24 ENCOUNTER — Ambulatory Visit: Payer: BC Managed Care – PPO | Admitting: Family Medicine

## 2021-03-12 ENCOUNTER — Other Ambulatory Visit: Payer: Self-pay | Admitting: Neurology

## 2021-03-25 ENCOUNTER — Encounter: Payer: Self-pay | Admitting: Family Medicine

## 2021-03-25 ENCOUNTER — Other Ambulatory Visit: Payer: Self-pay

## 2021-03-25 DIAGNOSIS — G44211 Episodic tension-type headache, intractable: Secondary | ICD-10-CM

## 2021-03-25 MED ORDER — ONDANSETRON HCL 8 MG PO TABS: ORAL_TABLET | ORAL | 3 refills | Status: DC

## 2021-04-09 ENCOUNTER — Other Ambulatory Visit: Payer: Self-pay | Admitting: Neurology

## 2021-04-12 ENCOUNTER — Other Ambulatory Visit: Payer: Self-pay

## 2021-04-12 ENCOUNTER — Ambulatory Visit
Admission: RE | Admit: 2021-04-12 | Discharge: 2021-04-12 | Disposition: A | Discharge status: Home / Self Care | Payer: BC Managed Care – PPO | Source: Ambulatory Visit | Attending: Internal Medicine | Admitting: Internal Medicine

## 2021-04-12 VITALS — BP 158/101 | HR 110 | Temp 98.4°F | Resp 18

## 2021-04-12 DIAGNOSIS — H65191 Other acute nonsuppurative otitis media, right ear: Secondary | ICD-10-CM | POA: Diagnosis not present

## 2021-04-12 DIAGNOSIS — H6503 Acute serous otitis media, bilateral: Secondary | ICD-10-CM | POA: Diagnosis not present

## 2021-04-12 MED ORDER — AMOXICILLIN 875 MG PO TABS: 875.0000 mg | ORAL_TABLET | Freq: Two times a day (BID) | ORAL | 0 refills | Status: AC

## 2021-04-12 MED ORDER — FLUTICASONE PROPIONATE 50 MCG/ACT NA SUSP: 1.0000 | Freq: Every day | NASAL | 0 refills | Status: DC

## 2021-04-12 NOTE — Discharge Instructions (Addendum)
You are being treated for right ear infection with amoxicillin antibiotic.  You are also being treated for fluid in the ears with the Flonase.  Please only use this medication for 3 days maximum.  Continue your Zyrtec as well as that will help with this.  Please follow-up with primary care physician or urgent care if symptoms persist.

## 2021-04-12 NOTE — ED Triage Notes (Signed)
Four day h/o right ear pain. Pt reports at the onset he had a HA that radiated to the right side of his jaw before moving towards his right ear. Has been taking ibuprofen with some relief. Ear pain is worse at night. Denies cough and congestion. No left ear sxs.

## 2021-04-12 NOTE — ED Provider Notes (Addendum)
[facility]    CSN: [id number] Arrival date & time: 04/12/21  1430      History   Chief Complaint Chief Complaint  Patient presents with   3pm appointment   Otalgia    right    HPI Steven Holland is a 50 y.o. male.   Patient presents with 4-day history of right ear pain.  States that symptoms started with a headache that moved down to his ear.  Denies any fever or upper respiratory symptoms.  Denies any trauma to ear.  Has been taking ibuprofen over-the-counter with some relief of ear pain.  No pain to left ear.   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 Authorizing Provider  amoxicillin (AMOXIL) 875 MG tablet Take 1 tablet (875 mg total) by mouth 2 (two) times daily for 10 days. 04/12/21 04/22/21 Yes Odis Luster, FNP  fluticasone (FLONASE) 50 MCG/ACT nasal spray  Place 1 spray into both nostrils daily. Please use nose spray for 3 days only. 04/12/21  Yes Odis Luster, FNP  BELLADONNA ALK-PHENOBARBITAL PO Take by mouth. Take 1 tablet by mouth before meals and at bedtime prn for abd cramping.    [provider]  busPIRone (BUSPAR) 7.5 MG tablet TAKE 1 TABLET BY MOUTH TWICE A DAY AS NEEDED 01/16/21   Marin Olp, MD  butalbital-acetaminophen-caffeine (FIORICET WITH CODEINE) [phone] MG capsule TAKE 1 CAPSULE BY MOUTH EVERY 6 HOURS AS NEEDED 01/26/21   Pieter Partridge, DO  dicyclomine (BENTYL) 10 MG capsule Take 1 capsule (10 mg total) by mouth 4 (four) times daily -  before meals and at bedtime. 12/31/19   Marin Olp, MD  EMGALITY 120 MG/ML SOAJ INJECT 120 MG INTO THE SKIN EVERY 30 (THIRTY) DAYS. 02/08/21   Pieter Partridge, DO  folic acid (FOLVITE) 951 MCG tablet Take 400 mcg by mouth daily.    [provider]  hydrochlorothiazide (HYDRODIURIL) 25 MG tablet Take 1 tablet (25 mg total) by mouth daily. 07/02/20   Marin Olp, MD  Magnesium 250 MG TABS Take 250 mg by mouth daily.     [provider]  Multiple Vitamin (MULITIVITAMIN WITH MINERALS) TABS Take 1 tablet by mouth daily.    [provider]  nortriptyline (PAMELOR) 50 MG capsule TAKE 2 CAPSULES (100 MG TOTAL) BY MOUTH AT BEDTIME. 03/12/21   Tomi Likens, Adam R, DO  omeprazole (PRILOSEC) 20 MG capsule TAKE 1 CAPSULE BY MOUTH EVERY DAY 11/26/20   Marin Olp, MD  ondansetron (ZOFRAN) 8 MG tablet TAKE 1 TABLET BY MOUTH EVERY 8 HOURS AS NEEDED 03/25/21   Marin Olp, MD  Potassium Bicarbonate 99 MG CAPS Take 1 capsule by mouth daily.    [provider]  Tadalafil 2.5 MG TABS TAKE 1 TABLET BY MOUTH EVERY DAY 12/25/20   Marin Olp, MD  Testosterone 20.25 MG/ACT (1.62%) GEL APPLY 4 PUMPS TOPICALLY DAILY 11/18/19   Marin Olp, MD  Ubrogepant (UBRELVY) 100 MG TABS Take 1 tablet by mouth as needed (May repeat dose in 2 hours.  Maximum 2  tablets in 24 hours). 05/20/20   Pieter Partridge, DO  vitamin B-12 (CYANOCOBALAMIN) 1000 MCG tablet Take 1,000 mcg by mouth daily.    [provider]    Family History Family History  Problem Relation Age of Onset   Arthritis Mother        knee replacements and back   Heart disease Father        37s reportedly- patient no issues   Benign prostatic hyperplasia Father    Other Brother        hypogonadism/low 65 both brothers   Migraines Brother        bothbrothers   Prostate cancer Maternal Grandfather    Diabetes Paternal Grandmother    Colon cancer Neg Hx    Rectal cancer Neg Hx    Stomach cancer Neg Hx    Esophageal cancer Neg Hx     Social History Social History   Tobacco Use   Smoking status: Never   Smokeless tobacco: Never  Vaping Use   Vaping Use: Never used  Substance Use Topics   Alcohol use: Yes    Comment: occasional liquor/wine   Drug use: No     Allergies   Sulfa antibiotics, Milk-related compounds, and Nickel   Review of Systems Review of Systems  HENT:  Positive for ear pain.   Per HPI  Physical Exam Triage Vital Signs ED Triage Vitals  Enc Vitals Group     BP 04/12/21 1445 (!) 158/101     Pulse Rate 04/12/21 1445 (!) 110     Resp 04/12/21 1445 18     Temp 04/12/21 1445 98.4 F (36.9 C)     Temp Source 04/12/21 1445 Oral     SpO2 04/12/21 1445 94 %     Weight --      Height --      Head Circumference --      Peak Flow --      Pain Score 04/12/21 1448 4     Pain Loc --      Pain Edu? --      Excl. in [place]? --    No data found.  Updated Vital Signs BP (!) 158/101 (BP Location: Left Arm)   Pulse (!) 110   Temp 98.4 F (36.9 C) (Oral)   Resp 18   SpO2 95%   Visual Acuity Right Eye Distance:   Left Eye Distance:   Bilateral Distance:    Right Eye Near:   Left Eye Near:    Bilateral Near:     Physical Exam Constitutional:      Appearance: Normal appearance.  HENT:     Head: Normocephalic  and atraumatic.     Right  Ear: Hearing and ear canal normal. Tympanic membrane is erythematous.     Left Ear: Hearing and ear canal normal. A middle ear effusion is present.     Nose: Nose normal.     Mouth/Throat:     Pharynx: No posterior oropharyngeal erythema.  Eyes:     Extraocular Movements: Extraocular movements intact.     Conjunctiva/sclera: Conjunctivae normal.  Cardiovascular:     Rate and Rhythm: Normal rate and regular rhythm.     Pulses: Normal pulses.     Heart sounds: Normal heart sounds.  Pulmonary:     Effort: Pulmonary effort is normal.     Breath sounds: Normal breath sounds.  Skin:    General: Skin is warm and dry.  Neurological:     General: No focal deficit present.     Mental Status: He is alert and oriented to person, place, and time. Mental status is at baseline.  Psychiatric:        Mood and Affect: Mood normal.        Behavior: Behavior normal.        Thought Content: Thought content normal.        Judgment: Judgment normal.     UC Treatments / Results  Labs (all labs ordered are listed, but only abnormal results are displayed) Labs Reviewed - No data to display  EKG   Radiology No results found.  Procedures Procedures (including critical care time)  Medications Ordered in UC Medications - No data to display  Initial Impression / Assessment and Plan / UC Course  I have reviewed the triage vital signs and the nursing notes.  Pertinent labs & imaging results that were available during my care of the patient were reviewed by me and considered in my medical decision making (see chart for details).    Will treat right acute otitis media with amoxicillin antibiotic x10 days.  Patient already taking cetirizine. Will add Flonase to help treat serous otitis media.  Advised patient to follow-up with PCP or urgent care if symptoms persist.  Patient has a blood pressure reading in urgent care today.  Blood pressure and heart rate consistent with previous visits to primary  care and urgent care.  Discussed strict return precautions. Patient verbalized understanding and is agreeable with plan.  Final Clinical Impressions(s) / UC Diagnoses   Final diagnoses:  Other non-recurrent acute nonsuppurative otitis media of right ear  Non-recurrent acute serous otitis media of both ears     Discharge Instructions      You are being treated for right ear infection with amoxicillin antibiotic.  You are also being treated for fluid in the ears with the Flonase.  Please only use this medication for 3 days maximum.  Continue your Zyrtec as well as that will help with this.  Please follow-up with primary care physician or urgent care if symptoms persist.     ED Prescriptions     Medication Sig Dispense Auth. Provider   amoxicillin (AMOXIL) 875 MG tablet Take 1 tablet (875 mg total) by mouth 2 (two) times daily for 10 days. 20 tablet Odis Luster, FNP   fluticasone (FLONASE) 50 MCG/ACT nasal spray Place 1 spray into both nostrils daily. Please use nose spray for 3 days only. 16 g Odis Luster, FNP      PDMP not reviewed this encounter.   Odis Luster, FNP 04/12/21 [place], [place], [place] 04/12/21  [place]

## 2021-05-01 ENCOUNTER — Other Ambulatory Visit: Payer: Self-pay | Admitting: Neurology

## 2021-05-01 ENCOUNTER — Other Ambulatory Visit: Payer: Self-pay | Admitting: Family Medicine

## 2021-05-01 DIAGNOSIS — E291 Testicular hypofunction: Secondary | ICD-10-CM

## 2021-05-10 ENCOUNTER — Other Ambulatory Visit: Payer: Self-pay | Admitting: Neurology

## 2021-05-11 NOTE — Telephone Encounter (Signed)
Given enough only till Follow up with [facility]

## 2021-05-12 ENCOUNTER — Other Ambulatory Visit: Payer: Self-pay | Admitting: Neurology
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
## 2021-05-24 ENCOUNTER — Other Ambulatory Visit: Payer: Self-pay

## 2021-05-24 ENCOUNTER — Encounter: Payer: Self-pay | Admitting: Neurology

## 2021-05-24 ENCOUNTER — Ambulatory Visit (INDEPENDENT_AMBULATORY_CARE_PROVIDER_SITE_OTHER): Payer: BC Managed Care – PPO | Admitting: Neurology

## 2021-05-24 VITALS — BP 161/108 | HR 99 | Ht 68.0 in | Wt 221.8 lb

## 2021-05-24 DIAGNOSIS — G43709 Chronic migraine without aura, not intractable, without status migrainosus: Secondary | ICD-10-CM

## 2021-05-24 MED ORDER — NORTRIPTYLINE HCL 50 MG PO CAPS: 100.0000 mg | ORAL_CAPSULE | Freq: Every day | ORAL | 5 refills | Status: DC

## 2021-05-24 MED ORDER — UBRELVY 100 MG PO TABS: 1.0000 | ORAL_TABLET | ORAL | 5 refills | Status: DC | PRN

## 2021-05-24 NOTE — Patient Instructions (Signed)
Will start Botox.  Continue nortriptyline 100mg  at bedtime.  Stop Emgality When you get a migraine, try one of 2 rescue protocol:  - Take [name] 100mg  (maximum 100mg  in 24 hours).  Do not drive for 8 hours after taken  - Take Elyxyb once with Ubrelvy 100mg .  May still repeat Ubrelvy in 2 hours. 3.  Limit use of pain relievers to no more than 2 days out of week to prevent risk of rebound or medication-overuse headache. 4.  Keep headache diary

## 2021-05-25 ENCOUNTER — Telehealth: Payer: Self-pay

## 2021-05-25 NOTE — Telephone Encounter (Signed)
New message    MARSHAWN NORMOYLE [facility]) Rx #: [id number] Bernita Raisin 100MG  tablets   Form [employer] Electronic PA Form [phone] NCPDP) Created 23 hours ago Sent to Plan 4 minutes ago Plan Response 4 minutes ago Submit Clinical Questions 1 minute ago Determination Wait for Determination Please wait for [employer] NCPDP 2017 to return a determination.

## 2021-05-28 NOTE — Telephone Encounter (Signed)
F/u    Outcome Denied on September 21  Your PA request has been denied. Additional information will be provided in the denial communication. (Message 1140) Your PA request has been denied. Additional information will be provided in the denial communication. (Message 1140)

## 2021-05-31 ENCOUNTER — Telehealth: Payer: Self-pay

## 2021-05-31 NOTE — Telephone Encounter (Signed)
New message   Burnadette Pop (Key: [id number])Need help? Call us at [phone] Status Sent to [employer] The plan will fax you a determination, typically within 1 to 5 business days.  How do I follow up? Drug Botox 200UNIT solution Form Blue [employer] [place] Commercial Botulinum Toxin Form Prior Authorization for Botulinum Toxins [phone] [phone]

## 2021-06-01 NOTE — Telephone Encounter (Signed)
F/u   Received prior authorization from CoverMyMeds medication Ubrelvy 100 mg tablets.  Prior authorization resubmit outcome below.    Steven Holland (Key: [id number])Need help? Call us at [phone] Outcome N/A This drug is too soon to refill. Please advise the pharmacy to either resubmit on the next fill date or contact the Pharmacy Help Line at [phone] for assistance Next Steps The plan will fax you a determination, typically within 1 to 5 business days.  How do I follow up? Drug Steven Holland 100MG  tablets Form [facility] Non-Medicare Formulary Exception / Prior Authorization Request Form Prior Authorization for [facility] non-Medicare members (NOT FOR MEDICARE PATIENTS) [phone] [phone]

## 2021-06-08 ENCOUNTER — Ambulatory Visit (INDEPENDENT_AMBULATORY_CARE_PROVIDER_SITE_OTHER): Payer: BC Managed Care – PPO | Admitting: Family Medicine

## 2021-06-08 DIAGNOSIS — E785 Hyperlipidemia, unspecified: Secondary | ICD-10-CM

## 2021-06-08 DIAGNOSIS — K219 Gastro-esophageal reflux disease without esophagitis: Secondary | ICD-10-CM

## 2021-06-08 DIAGNOSIS — Z0000 Encounter for general adult medical examination without abnormal findings: Secondary | ICD-10-CM

## 2021-06-08 DIAGNOSIS — F411 Generalized anxiety disorder: Secondary | ICD-10-CM

## 2021-06-08 DIAGNOSIS — R739 Hyperglycemia, unspecified: Secondary | ICD-10-CM

## 2021-06-08 DIAGNOSIS — I10 Essential (primary) hypertension: Secondary | ICD-10-CM

## 2021-06-08 NOTE — Patient Instructions (Addendum)
Health Maintenance Due  Topic Date Due   COVID-19 Vaccine (1)  - Recommended getting Omicron specific booster only at your local pharmacy! Please let us know when you have received this vaccination.  Never done   Hepatitis C Screening  Never done   Zoster Vaccines- Shingrix (1 of 2)  - Please check with your pharmacy to see if they have the shingrix vaccine. If they do- please get this immunization and update [place] by phone call or mychart with dates you receive the vaccine  Never done   INFLUENZA VACCINE  Never done    Recommended follow up: No follow-ups on file.

## 2021-06-08 NOTE — Progress Notes (Signed)
Same-day cancellation/reschedule-erroneous encounter

## 2021-06-09 NOTE — Telephone Encounter (Signed)
F/u  New message    Steven Holland (Key: [id number])Need help? Call us at [phone] Status Sent to [employer] Next Steps The plan will fax you a determination, typically within 1 to 5 business days.   How do I follow up? Drug Botox 200UNIT solution Form [employer] [place] Commercial Botulinum Toxin Form Prior Authorization for Botulinum Toxins [phone] [phone]

## 2021-06-09 NOTE — Telephone Encounter (Signed)
Fax received, Bernita Raisin partially approved for 12 months.

## 2021-06-14 NOTE — Telephone Encounter (Signed)
F/u   Receive fax from [employer] [place]  Approval medication Botox   Effective date  06/18/2021  to 06/18/2022   Days / unit approved : 4  Call the patient at [phone] and leave a voicemail of approval from [facility] if an appointment is needed to contact the office and call his specialty pharmacy for consent to delivery to the office.

## 2021-06-15 ENCOUNTER — Other Ambulatory Visit: Payer: Self-pay | Admitting: Neurology

## 2021-06-21 ENCOUNTER — Other Ambulatory Visit: Payer: Self-pay | Admitting: Family Medicine

## 2021-06-25 ENCOUNTER — Telehealth: Payer: Self-pay

## 2021-06-25 NOTE — Telephone Encounter (Signed)
New message   I called & left a voicemail at [phone] and advised the patient to contact his Specialty Pharmacy for Botox consent delivery to the office for an upcoming appt on 07/23/21 with Dr. Everlena Cooper.

## 2021-07-01 ENCOUNTER — Other Ambulatory Visit (HOSPITAL_COMMUNITY): Payer: Self-pay

## 2021-07-01 NOTE — Telephone Encounter (Signed)
F/u   Call [facility] at [phone]  patient will need prior authorization for Botox medication.  [facility] will [facility] to be filled out and faxed back.   I left a message for the patient to call me back to discuss.

## 2021-07-01 NOTE — Telephone Encounter (Signed)
I will call Steven Holland to do a test claim on the patient .

## 2021-07-09 ENCOUNTER — Other Ambulatory Visit: Payer: Self-pay

## 2021-07-09 ENCOUNTER — Other Ambulatory Visit (HOSPITAL_COMMUNITY): Payer: Self-pay

## 2021-07-09 DIAGNOSIS — G43709 Chronic migraine without aura, not intractable, without status migrainosus: Secondary | ICD-10-CM

## 2021-07-09 MED ORDER — BOTOX 200 UNITS IJ SOLR: INTRAMUSCULAR | 4 refills | Status: DC

## 2021-07-12 NOTE — Telephone Encounter (Signed)
F/u   Call & spoke with the patient at  [phone] to contact [facility] at  [phone] and consent to the delivery of Botox to the office.    The patient has an upcoming appt on  07/23/2021 with Dr. Everlena Cooper.   The patient is aware I will contact [facility] in a couple of days for consent to delivery to the office.

## 2021-07-19 ENCOUNTER — Other Ambulatory Visit: Payer: Self-pay | Admitting: Family Medicine

## 2021-07-23 ENCOUNTER — Ambulatory Visit (INDEPENDENT_AMBULATORY_CARE_PROVIDER_SITE_OTHER): Payer: BC Managed Care – PPO | Admitting: Neurology

## 2021-07-23 ENCOUNTER — Other Ambulatory Visit: Payer: Self-pay

## 2021-07-23 DIAGNOSIS — G43709 Chronic migraine without aura, not intractable, without status migrainosus: Secondary | ICD-10-CM

## 2021-07-23 MED ORDER — ONABOTULINUMTOXINA 100 UNITS IJ SOLR
200.0000 [IU] | Freq: Once | INTRAMUSCULAR | Status: AC
  Administered 2021-07-23: 155 [IU] via INTRAMUSCULAR

## 2021-07-23 NOTE — Progress Notes (Signed)
[facility]   Procedure Note Botox  Attending: Dr. Toshiba Null  Preoperative Diagnosis(es): Chronic migraine  Consent obtained from: The patient Benefits discussed included, but were not limited to decreased muscle tightness, increased joint range of motion, and decreased pain.  Risk discussed included, but were not limited pain and discomfort, bleeding, bruising, excessive weakness, venous thrombosis, muscle atrophy and dysphagia.  Anticipated outcomes of the procedure as well as he risks and benefits of the alternatives to the procedure, and the roles and tasks of the personnel to be involved, were discussed with the patient, and the patient consents to the procedure and agrees to proceed. A copy of the patient medication guide was given to the patient which explains the blackbox warning.  Patients identity and treatment sites confirmed Yes.  .  Details of Procedure: Skin was cleaned with alcohol. Prior to injection, the needle plunger was aspirated to make sure the needle was not within a blood vessel.  There was no blood retrieved on aspiration.    Following is a summary of the muscles injected  And the amount of Botulinum toxin used:  Dilution 200 units of Botox was reconstituted with 4 ml of preservative free normal saline. Time of reconstitution: At the time of the office visit (<30 minutes prior to injection)   Injections  155 total units of Botox was injected with a 30 gauge needle.  Injection Sites: L occipitalis: 15 units- 3 sites  R occiptalis: 15 units- 3 sites  L upper trapezius: 15 units- 3 sites R upper trapezius: 15 units- 3 sits          L paraspinal: 10 units- 2 sites R paraspinal: 10 units- 2 sites  Face L frontalis(2 injection sites):10 units   R frontalis(2 injection sites):10 units         L corrugator: 5 units   R corrugator: 5 units           Procerus: 5 units   L temporalis: 20 units R temporalis: 20 units   Agent:  200 units of botulinum Type  A (Onobotulinum Toxin type A) was reconstituted with 4 ml of preservative free normal saline.  Time of reconstitution: At the time of the office visit (<30 minutes prior to injection)     Total injected (Units): 155  Total wasted (Units): none wasted  Patient tolerated procedure well without complications.   Reinjection is anticipated in 3 months.    

## 2021-07-26 ENCOUNTER — Encounter: Payer: Self-pay | Admitting: Neurology

## 2021-08-17 ENCOUNTER — Ambulatory Visit: Payer: BC Managed Care – PPO | Admitting: Family Medicine

## 2021-08-17 ENCOUNTER — Other Ambulatory Visit: Payer: Self-pay

## 2021-08-17 ENCOUNTER — Encounter: Payer: Self-pay | Admitting: Family Medicine

## 2021-08-17 VITALS — BP 135/84 | HR 93 | Temp 97.2°F | Ht 68.0 in | Wt 219.2 lb

## 2021-08-17 DIAGNOSIS — I10 Essential (primary) hypertension: Secondary | ICD-10-CM

## 2021-08-17 DIAGNOSIS — E785 Hyperlipidemia, unspecified: Secondary | ICD-10-CM

## 2021-08-17 DIAGNOSIS — Z79899 Other long term (current) drug therapy: Secondary | ICD-10-CM | POA: Diagnosis not present

## 2021-08-17 DIAGNOSIS — Z1159 Encounter for screening for other viral diseases: Secondary | ICD-10-CM

## 2021-08-17 DIAGNOSIS — G44211 Episodic tension-type headache, intractable: Secondary | ICD-10-CM

## 2021-08-17 DIAGNOSIS — F411 Generalized anxiety disorder: Secondary | ICD-10-CM | POA: Diagnosis not present

## 2021-08-17 DIAGNOSIS — R739 Hyperglycemia, unspecified: Secondary | ICD-10-CM

## 2021-08-17 DIAGNOSIS — G43109 Migraine with aura, not intractable, without status migrainosus: Secondary | ICD-10-CM

## 2021-08-17 DIAGNOSIS — K219 Gastro-esophageal reflux disease without esophagitis: Secondary | ICD-10-CM

## 2021-08-17 DIAGNOSIS — E291 Testicular hypofunction: Secondary | ICD-10-CM

## 2021-08-17 LAB — CBC WITH DIFFERENTIAL/PLATELET
Basophils Absolute: 0 10*3/uL (ref 0.0–0.1)
Basophils Relative: 0.4 % (ref 0.0–3.0)
Eosinophils Absolute: 0.3 10*3/uL (ref 0.0–0.7)
Eosinophils Relative: 2.9 % (ref 0.0–5.0)
HCT: 43.5 % (ref 39.0–52.0)
Hemoglobin: 14.6 g/dL (ref 13.0–17.0)
Lymphocytes Relative: 24.9 % (ref 12.0–46.0)
Lymphs Abs: 2.5 10*3/uL (ref 0.7–4.0)
MCHC: 33.6 g/dL (ref 30.0–36.0)
MCV: 88.1 fl (ref 78.0–100.0)
Monocytes Absolute: 0.7 10*3/uL (ref 0.1–1.0)
Monocytes Relative: 7 % (ref 3.0–12.0)
Neutro Abs: 6.4 10*3/uL (ref 1.4–7.7)
Neutrophils Relative %: 64.8 % (ref 43.0–77.0)
Platelets: 254 10*3/uL (ref 150.0–400.0)
RBC: 4.94 Mil/uL (ref 4.22–5.81)
RDW: 13.4 % (ref 11.5–15.5)
WBC: 9.9 10*3/uL (ref 4.0–10.5)

## 2021-08-17 LAB — LDL CHOLESTEROL, DIRECT: Direct LDL: 136 mg/dL

## 2021-08-17 LAB — COMPREHENSIVE METABOLIC PANEL
ALT: 68 U/L — ABNORMAL HIGH (ref 0–53)
AST: 41 U/L — ABNORMAL HIGH (ref 0–37)
Albumin: 4.3 g/dL (ref 3.5–5.2)
Alkaline Phosphatase: 87 U/L (ref 39–117)
BUN: 11 mg/dL (ref 6–23)
CO2: 35 mEq/L — ABNORMAL HIGH (ref 19–32)
Calcium: 9.4 mg/dL (ref 8.4–10.5)
Chloride: 99 mEq/L (ref 96–112)
Creatinine, Ser: 0.93 mg/dL (ref 0.40–1.50)
GFR: 94.64 mL/min (ref >60.00)
Glucose, Bld: 100 mg/dL — ABNORMAL HIGH (ref 70–99)
Potassium: 3.7 mEq/L (ref 3.5–5.1)
Sodium: 142 mEq/L (ref 135–145)
Total Bilirubin: 0.3 mg/dL (ref 0.2–1.2)
Total Protein: 7.1 g/dL (ref 6.0–8.3)

## 2021-08-17 LAB — LIPID PANEL
Cholesterol: 208 mg/dL — ABNORMAL HIGH (ref 0–200)
HDL: 47.7 mg/dL (ref >39.00)
NonHDL: 160.75
Total CHOL/HDL Ratio: 4
Triglycerides: 259 mg/dL — ABNORMAL HIGH (ref 0.0–149.0)
VLDL: 51.8 mg/dL — ABNORMAL HIGH (ref 0.0–40.0)

## 2021-08-17 LAB — TESTOSTERONE: Testosterone: 303.09 ng/dL (ref 300.00–890.00)

## 2021-08-17 LAB — HEMOGLOBIN A1C: Hgb A1c MFr Bld: 6 % (ref 4.6–6.5)

## 2021-08-17 LAB — PSA: PSA: 0.59 ng/mL (ref 0.10–4.00)

## 2021-08-17 MED ORDER — ONDANSETRON HCL 8 MG PO TABS: ORAL_TABLET | ORAL | 11 refills | Status: AC

## 2021-08-17 MED ORDER — OMEPRAZOLE 20 MG PO CPDR: 20.0000 mg | DELAYED_RELEASE_CAPSULE | Freq: Every day | ORAL | 3 refills | Status: AC

## 2021-08-17 MED ORDER — DICYCLOMINE HCL 10 MG PO CAPS: 10.0000 mg | ORAL_CAPSULE | Freq: Three times a day (TID) | ORAL | 5 refills | Status: DC

## 2021-08-17 MED ORDER — BUSPIRONE HCL 7.5 MG PO TABS: 7.5000 mg | ORAL_TABLET | Freq: Two times a day (BID) | ORAL | 3 refills | Status: AC | PRN

## 2021-08-17 MED ORDER — HYDROCHLOROTHIAZIDE 25 MG PO TABS
25.0000 mg | ORAL_TABLET | Freq: Every day | ORAL | 3 refills | Status: AC
Start: 2021-08-17 — End: ?

## 2021-08-17 MED ORDER — TESTOSTERONE 20.25 MG/ACT (1.62%) TD GEL: TRANSDERMAL | 1 refills | Status: DC

## 2021-08-17 MED ORDER — TADALAFIL 2.5 MG PO TABS
ORAL_TABLET | ORAL | 3 refills | Status: AC
Start: 2021-08-17 — End: ?

## 2021-08-17 NOTE — Progress Notes (Signed)
Phone [phone] In person visit   Subjective:   Steven Holland is a 50 y.o. year old very pleasant male patient who presents for/with See problem oriented charting Chief Complaint  Patient presents with   Medication Refill    Pt is wanting all medications refilled.    This visit occurred during the SARS-CoV-2 public health emergency.  Safety protocols were in place, including screening questions prior to the visit, additional usage of staff PPE, and extensive cleaning of exam room while observing appropriate contact time as indicated for disinfecting solutions.   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    Priority: High   Anxiety state 12/31/2019    Priority: Medium    Hyperglycemia 05/09/2017    Priority: Medium    Hyperlipidemia 11/04/2016    Priority: Medium    Hypogonadism male 12/18/2011    Priority: Medium    HTN (hypertension) 12/18/2011    Priority: Medium    Migraine headache with aura 12/18/2011    Priority: Medium    GERD (gastroesophageal reflux disease) 12/18/2011    Priority: Medium    Irritable bowel syndrome (IBS) 12/18/2011    Priority: Medium    Obesity, Class I, BMI 30-34.9 10/22/2015    Priority: Low   ED (erectile dysfunction) 12/18/2011    Priority: Low   AR (allergic rhinitis) 12/18/2011    Priority: Low   Nonallopathic lesion of thoracic region 12/01/2020   Tonsillith 05/06/2019    Medications- reviewed and updated Current Outpatient Medications  Medication Sig Dispense Refill   BELLADONNA ALK-PHENOBARBITAL PO Take by mouth. Take 1 tablet by mouth before meals and at bedtime prn for abd cramping.     Botulinum Toxin Type A (BOTOX) 200 units SOLR Inject 155 units IM into the multiple  sites in the face neck and head once every 90 days 1 each 4   busPIRone (BUSPAR) 7.5 MG tablet TAKE 1 TABLET BY MOUTH TWICE A DAY AS NEEDED 180 tablet 1    butalbital-acetaminophen-caffeine (FIORICET WITH CODEINE) 50-325-40-30 MG capsule TAKE 1 CAPSULE BY MOUTH EVERY 6 HOURS AS NEEDED 15 capsule 2   co-enzyme Q-10 30 MG capsule Take 30 mg by mouth 3 (three) times daily.     dicyclomine (BENTYL) 10 MG capsule Take 1 capsule (10 mg total) by mouth 4 (four) times daily -  before meals and at bedtime. 60 capsule 2   folic acid (FOLVITE) 754 MCG tablet Take 400 mcg by mouth daily.     hydrochlorothiazide (HYDRODIURIL) 25 MG tablet TAKE 1 TABLET BY MOUTH EVERY DAY 90 tablet 3   Magnesium 250 MG TABS Take 250 mg by mouth daily.      Multiple Vitamin (MULITIVITAMIN WITH MINERALS) TABS Take 1 tablet by mouth daily.     nortriptyline (PAMELOR) 50 MG capsule TAKE 2 CAPSULES BY MOUTH AT BEDTIME. 180 capsule 2   omeprazole (PRILOSEC) 20 MG capsule TAKE 1 CAPSULE BY MOUTH EVERY DAY 90 capsule 3   ondansetron (ZOFRAN) 8 MG tablet TAKE 1 TABLET BY MOUTH EVERY 8 HOURS AS NEEDED 9 tablet 3   Potassium Bicarbonate 99 MG CAPS Take 1 capsule by mouth daily.     Tadalafil 2.5 MG TABS TAKE 1 TABLET BY MOUTH EVERY DAY 90 tablet 3   Testosterone 20.25 MG/ACT (1.62%) GEL APPLY 4 PUMPS TOPICALLY DAILY 450 g 2   Ubrogepant (UBRELVY) 100 MG TABS Take 1 tablet by mouth as needed (May  repeat dose in 2 hours.  Maximum 2 tablets in 24 hours). 10 tablet 5   vitamin B-12 (CYANOCOBALAMIN) 1000 MCG tablet Take 1,000 mcg by mouth daily.     No current facility-administered medications for this visit.     Objective:  BP 135/84 Comment: last home reading   Pulse 93    Temp (!) 97.2 F (36.2 C)    Ht 5' 8" (1.727 m)    Wt 219 lb 3.2 oz (99.4 kg)    SpO2 98%    BMI 33.33 kg/m  Gen: NAD, resting comfortably CV: RRR no murmurs rubs or gallops Lungs: CTAB no crackles, wheeze, rhonchi Ext: no edema Skin: warm, dry     Assessment and Plan   #hypertension S: medication: hctz 25 mg at night Home readings #s: 135/84 BP Readings from Last 3 Encounters:  08/17/21 135/84  05/24/21  (!) 161/108  04/12/21 (!) 158/101  A/P: Blood pressure unfortunately elevated today at 158/102 as patient has migraine-has been controlled at home with updated number listed above when not having migraines-continue current medication -wants to see if botox will work before making changes in meds- see if migraines can be reduced and thus BP improved  #hyperlipidemia S: Medication:none at present - patient preferred to work on lifestyle Lab Results  Component Value Date   CHOL 226 (H) 06/26/2020   HDL 45 06/26/2020   LDLCALC 141 (H) 06/26/2020   LDLDIRECT 104.0 05/06/2019   TRIG 261 (H) 06/26/2020   CHOLHDL 5.0 (H) 06/26/2020   A/P: Poor control last year-recommended updating lipid panel.  We discussed 10-year ASCVD risk being at 7% and with dad with heart disease history we opted for ct cardiac scoring  # GERD S:Medication: Prilosec 20 mg daily   -Takes B12 supplement  A/P: Controlled. Continue current medications.  - we opted to hold off on considering pepcid until migraines better   # Anxiety S:Medication: Buspar 7.5 mg at night mainly and has been very helpful  Counseling: not at moment A/P: doing well- continue curent meds   # Low testosterone S:medication: compliant with 4 pumps of testosterone 20.25 mg/act gel per day  Lab Results  Component Value Date   TESTOSTERONE 505 06/26/2020  A/P: Need to update testosterone level as well as PSA as he agrees to this today Lab Results  Component Value Date   PSA 0.70 06/26/2020   PSA 0.78 05/06/2019   PSA 1.31 05/02/2017   # Hyperglycemia/insulin resistance/prediabetes S:  Medication: none at present Exercise and diet-weight within 1 pound of last year, some stress eating with migraines- worried this will be worse Lab Results  Component Value Date   HGBA1C 5.7 (H) 06/26/2020   HGBA1C 5.7 05/06/2019   HGBA1C 5.6 01/14/2015    A/P: hopefully stable- update a1c today- he is worried may be worse  #prior tonsil stones- was told  to cut out boost and that was effective -not having boost has made it harder to lose weight he reports- doing more sweets and carbs than he should  #Headaches/Migraines - continues to follow with Dr. Tomi Likens at neurology S: Patient gets migraines most days of the month (about 5 days no migraine recently).  Unfortunately having 1 today- really hurting -05/24/2021 visit Dr. Tomi Likens planned to start botox for patient - he was to continue nortriptyline 100 mg at night and stop Emgality.  - when a migraine were to occur patient could either take [name] 100 mg or Ubrelvy 100 mg-was advised to stop Fioricet with  codeine.  A/P:poor control unfortunately - encouraged ongoing follow up with Dr. Tomi Likens  # IBS S:overall stable lately.  Zofran and bentyl have been helpful  A/P: stable he reports- refill meds   # BPH/ED S:tadalafil 2.5 mg daily helpful . Ed relief as well A/P: doing well- continue current meds  Recommended follow up: No follow-ups on file. Future Appointments  Date Time Provider [place]  10/22/2021  9:10 AM Pieter Partridge, DO [facility] None  12/07/2021  1:30 PM Metta Clines R, DO [facility] None   Lab/Order associations:   ICD-10-CM   1. Primary hypertension  I10     2. Hyperlipidemia, unspecified hyperlipidemia type  E78.5     3. Hyperglycemia  R73.9     4. Anxiety state  F41.1     5. Migraine with aura and without status migrainosus, not intractable  G43.109     6. Hypogonadism male  E29.1     7. Gastroesophageal reflux disease without esophagitis  K21.9     8. Intractable episodic tension-type headache  G44.211     9. High risk medication use  Z79.899       No orders of the defined types were placed in this encounter.  I,Harris Phan,acting as a Education administrator for Garret Reddish, MD.,have documented all relevant documentation on the behalf of Garret Reddish, MD,as directed by  Garret Reddish, MD while in the presence of Garret Reddish, MD.  I, Garret Reddish, MD, have  reviewed all documentation for this visit. The documentation on 08/17/21 for the exam, diagnosis, procedures, and orders are all accurate and complete.   Return precautions advised.  Garret Reddish, MD

## 2021-08-17 NOTE — Patient Instructions (Addendum)
Health Maintenance Due  Topic Date Due   COVID-19 Vaccine (1)- wants to hold off Never done   Zoster Vaccines- Shingrix (1 of 2)- hold off during migraine Never done   INFLUENZA VACCINE declines fo rnow Never done   Please stop by lab before you go If you have mychart- we will send your results within 3 business days of [place] receiving them.  If you do not have mychart- we will call you about results within 5 business days of [place] receiving them.  *please also note that you will see labs on mychart as soon as they post. I will later go in and write notes on them- will say "notes from Dr. Durene Cal"  We will call you within two weeks about your referral to CT Cardiac Scoring. If you do not hear within 2 weeks, give [place] a call.   Recommended follow up: Return in about 6 months (around 02/15/2022) for a physical.

## 2021-08-18 LAB — HEPATITIS C ANTIBODY
Hepatitis C Ab: NONREACTIVE
SIGNAL TO CUT-OFF: <0.02 (ref <1.00)

## 2021-09-04 ENCOUNTER — Other Ambulatory Visit: Payer: Self-pay | Admitting: Neurology

## 2021-10-01 ENCOUNTER — Other Ambulatory Visit: Payer: Self-pay

## 2021-10-22 ENCOUNTER — Other Ambulatory Visit: Payer: Self-pay

## 2021-10-22 ENCOUNTER — Ambulatory Visit (INDEPENDENT_AMBULATORY_CARE_PROVIDER_SITE_OTHER): Payer: BC Managed Care – PPO | Admitting: Neurology

## 2021-10-22 DIAGNOSIS — G43709 Chronic migraine without aura, not intractable, without status migrainosus: Secondary | ICD-10-CM | POA: Diagnosis not present

## 2021-10-22 MED ORDER — ONABOTULINUMTOXINA 100 UNITS IJ SOLR
200.0000 [IU] | Freq: Once | INTRAMUSCULAR | Status: AC
  Administered 2021-10-22: 155 [IU] via INTRAMUSCULAR

## 2021-10-22 NOTE — Progress Notes (Signed)
[facility]  ° °Procedure Note Botox ° °Attending: Dr. Anuar Walgren ° °Preoperative Diagnosis(es): Chronic migraine ° °Consent obtained from: The patient °Benefits discussed included, but were not limited to decreased muscle tightness, increased joint range of motion, and decreased pain.  Risk discussed included, but were not limited pain and discomfort, bleeding, bruising, excessive weakness, venous thrombosis, muscle atrophy and dysphagia.  Anticipated outcomes of the procedure as well as he risks and benefits of the alternatives to the procedure, and the roles and tasks of the personnel to be involved, were discussed with the patient, and the patient consents to the procedure and agrees to proceed. A copy of the patient medication guide was given to the patient which explains the blackbox warning. ° °Patients identity and treatment sites confirmed Yes.  . ° °Details of Procedure: °Skin was cleaned with alcohol. Prior to injection, the needle plunger was aspirated to make sure the needle was not within a blood vessel.  There was no blood retrieved on aspiration.   ° °Following is a summary of the muscles injected  And the amount of Botulinum toxin used: ° °Dilution °200 units of Botox was reconstituted with 4 ml of preservative free normal saline. °Time of reconstitution: At the time of the office visit (<30 minutes prior to injection)  ° °Injections  °155 total units of Botox was injected with a 30 gauge needle. ° °Injection Sites: °L occipitalis: 15 units- 3 sites  °R occiptalis: 15 units- 3 sites ° °L upper trapezius: 15 units- 3 sites °R upper trapezius: 15 units- 3 sits          °L paraspinal: 10 units- 2 sites °R paraspinal: 10 units- 2 sites ° °Face °L frontalis(2 injection sites):10 units   °R frontalis(2 injection sites):10 units         °L corrugator: 5 units   °R corrugator: 5 units           °Procerus: 5 units   °L temporalis: 20 units °R temporalis: 20 units  ° °Agent:  °200 units of botulinum Type  A (Onobotulinum Toxin type A) was reconstituted with 4 ml of preservative free normal saline.  °Time of reconstitution: At the time of the office visit (<30 minutes prior to injection)  ° ° ° Total injected (Units):  155 ° Total wasted (Units):  45 ° °Patient tolerated procedure well without complications.   °Reinjection is anticipated in 3 months. ° ° °

## 2021-11-08 ENCOUNTER — Other Ambulatory Visit: Payer: Self-pay | Admitting: Family Medicine

## 2021-11-08 DIAGNOSIS — E291 Testicular hypofunction: Secondary | ICD-10-CM

## 2021-11-16 ENCOUNTER — Other Ambulatory Visit: Payer: Self-pay | Admitting: Family Medicine

## 2021-11-19 ENCOUNTER — Other Ambulatory Visit: Payer: Self-pay | Admitting: Neurology

## 2021-12-06 ENCOUNTER — Telehealth: Payer: Self-pay | Admitting: Pharmacy Technician
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
## 2021-12-06 NOTE — Telephone Encounter (Signed)
Patient Advocate Encounter ?  ?Received notification that prior authorization for Botox 200 units is required. ?  ?PA submitted on 01/02/2022 ?Faxed to [facility] ?Status is pending ?   ? ? ? ?Lyndel Safe, CPhT ?Pharmacy Patient Advocate Specialist ?[facility] Advocate Team ?Direct Number: [phone]  Fax: [phone]  ?

## 2021-12-07 ENCOUNTER — Encounter: Payer: Self-pay | Admitting: Neurology

## 2021-12-07 ENCOUNTER — Ambulatory Visit (INDEPENDENT_AMBULATORY_CARE_PROVIDER_SITE_OTHER): Payer: BC Managed Care – PPO | Admitting: Neurology

## 2021-12-07 VITALS — BP 153/102 | HR 103 | Ht 68.0 in | Wt 222.6 lb

## 2021-12-07 DIAGNOSIS — G43709 Chronic migraine without aura, not intractable, without status migrainosus: Secondary | ICD-10-CM | POA: Diagnosis not present

## 2021-12-07 DIAGNOSIS — I10 Essential (primary) hypertension: Secondary | ICD-10-CM

## 2021-12-07 DIAGNOSIS — M26609 Unspecified temporomandibular joint disorder, unspecified side: Secondary | ICD-10-CM

## 2021-12-07 MED ORDER — BACLOFEN 10 MG PO TABS: 10.0000 mg | ORAL_TABLET | Freq: Three times a day (TID) | ORAL | 5 refills | Status: DC | PRN

## 2021-12-07 NOTE — Patient Instructions (Signed)
Stop Botox.  Will try to start Vyepti every 3 months.  Continue nortriptyline 100mg  at bedtime ?Follow up with dentist for possible jaw pain/TMJ dysfunction.  Take baclofen 10mg  up to three times daily as needed ?Fioricet with codeine or Ubrelvy.  Limit use of pain relievers to no more than 2 days out of week to prevent risk of rebound or medication-overuse headache. ?Keep headache diary ?Follow up 5 months. ?

## 2021-12-08 NOTE — Telephone Encounter (Signed)
[facility] Approval for Botox 12/07/21-12/08/22 ? ?Speciality pharmacy [facility], set. ?This pharmacy do not accept Botox no longer. ? Will call [facility] per last note 06/2021.  ? ?Approval letter faxed to [facility] [phone]. ?

## 2021-12-16 NOTE — Progress Notes (Signed)
ERORR

## 2022-01-12 IMAGING — CT CT ANGIO HEAD
1 of 4 series · 6 of 30 positions shown · IV contrast (APPLIED)
Comparison: None.

CLINICAL DATA: Constant pulsatile tinnitus since 2718.

EXAM:
CT ANGIOGRAPHY HEAD AND NECK
TECHNIQUE: Multidetector CT imaging of the head and neck was performed using
the standard protocol during bolus administration of intravenous
contrast. Multiplanar CT image reconstructions and MIPs were
obtained to evaluate the vascular anatomy. Carotid stenosis
measurements (when applicable) are obtained utilizing NASCET
criteria, using the distal internal carotid diameter as the
denominator.
CONTRAST:  75mL 1W1MUX-IYK IOPAMIDOL (1W1MUX-IYK) INJECTION 76%

[Series 7: head/neck angio · axial · 0.52mm/px · z∈[-350,-80]mm · 6 of 190 slices shown]
[im 28/190  brain]
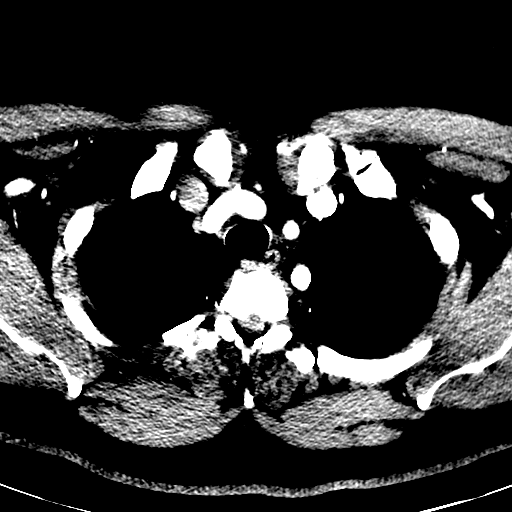
[im 55/190  bone]
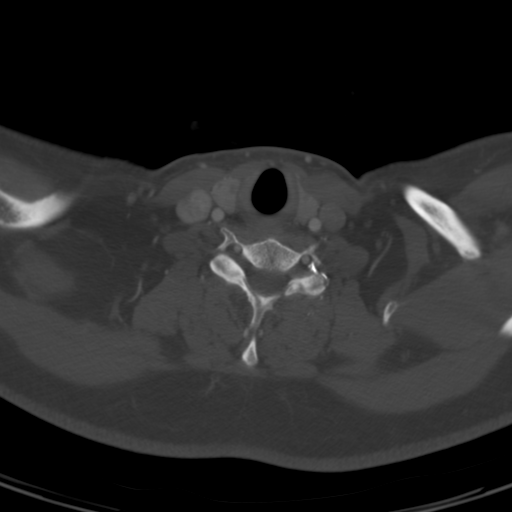
[im 82/190  brain]
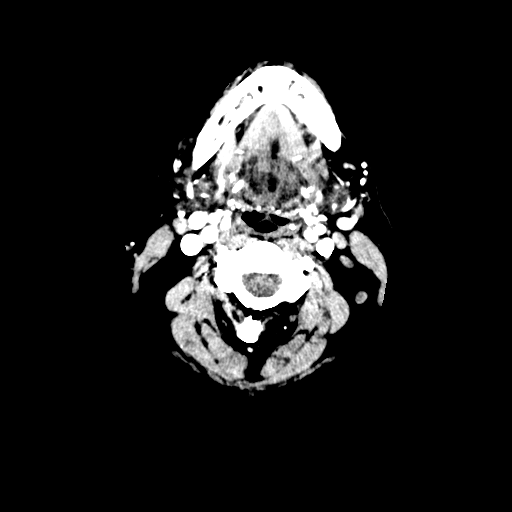
[im 109/190  bone]
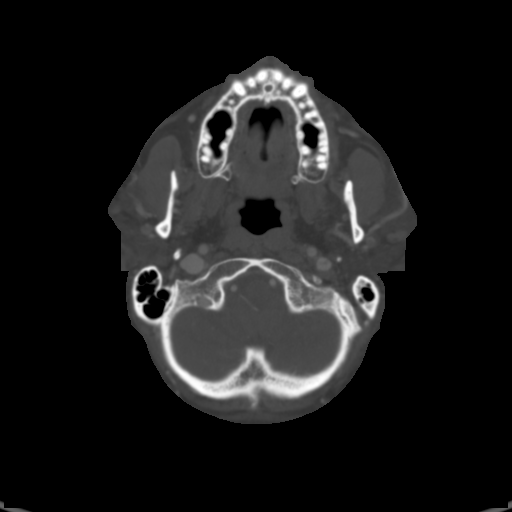
[im 136/190  brain]
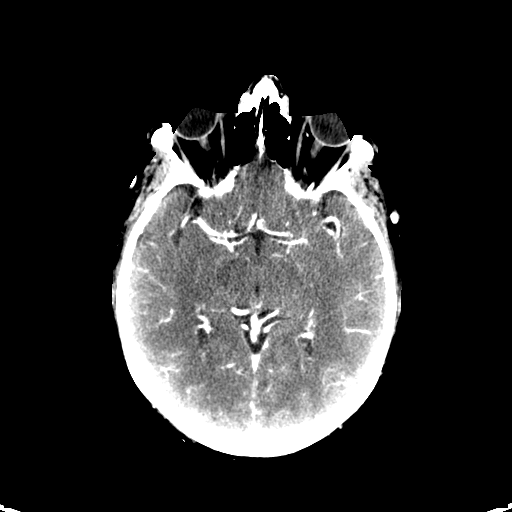
[im 163/190  bone]
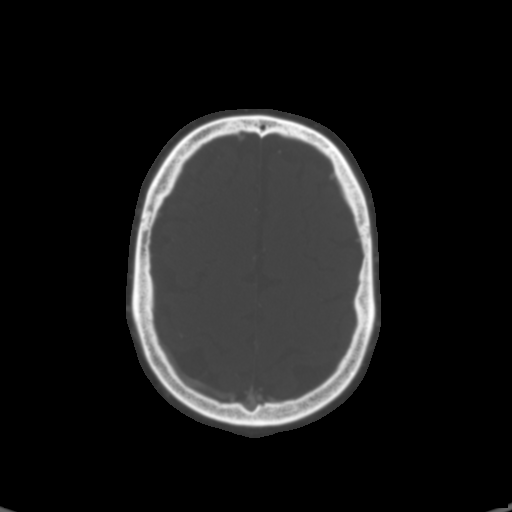

[6 of 30 positions shown; findings below may reference images not displayed]

FINDINGS: CT HEAD FINDINGS

Brain: No evidence of acute infarction, hemorrhage, hydrocephalus,
extra-axial collection or mass lesion/mass effect.

Vascular: See below

Skull: Normal. Negative for fracture or focal lesion.

Sinuses: Clear

Orbits: Negative

Review of the MIP images confirms the above findings

CTA NECK FINDINGS

Aortic arch: Normal with 3 vessel branching.

Right carotid system: Mild atheromatous plaque at the bifurcation.
No stenosis or ulceration.

Left carotid system: Vessels are smooth and widely patent

Vertebral arteries: No proximal subclavian stenosis. Both vertebral
arteries are smooth and widely patent to the dura.

Skeleton: Negative

Other neck: Subcentimeter thyroid nodules measuring up to 6 mm on
the right. No followup recommended (ref: [HOSPITAL]. [DATE]): 143-50).

Upper chest: Negative

Review of the MIP images confirms the above findings

CTA HEAD FINDINGS

Anterior circulation: Vessels are smooth and widely patent. Negative
for aneurysm or visible vascular malformation.

Posterior circulation: Vessels are smooth and widely patent.
Negative for aneurysm or visible vascular malformation.

Venous sinuses: Inadvertent preferential venous timing. The left
transverse sigmoid sinus is non dominant and the transverse sinus
essentially begins at the draining vein of Lab[place] where there is
likely also an arachnoid granulation.

Anatomic variants: None significant.

Review of the MIP images confirms the above findings
IMPRESSION: 1. No explanation symptoms.
2. Early atherosclerosis.

## 2022-01-21 ENCOUNTER — Ambulatory Visit: Payer: BC Managed Care – PPO | Admitting: Neurology

## 2022-01-26 ENCOUNTER — Other Ambulatory Visit: Payer: Self-pay | Admitting: Neurology

## 2022-02-15 ENCOUNTER — Encounter: Payer: BC Managed Care – PPO | Admitting: Family Medicine

## 2022-03-21 ENCOUNTER — Other Ambulatory Visit: Payer: Self-pay | Admitting: Neurology

## 2022-03-21 ENCOUNTER — Other Ambulatory Visit: Payer: Self-pay

## 2022-03-21 MED ORDER — BUTALBITAL-APAP-CAFF-COD 50-325-40-30 MG PO CAPS: 1.0000 | ORAL_CAPSULE | Freq: Three times a day (TID) | ORAL | 4 refills | Status: DC | PRN

## 2022-04-22 ENCOUNTER — Ambulatory Visit: Payer: BC Managed Care – PPO | Admitting: Neurology
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
## 2022-05-17 ENCOUNTER — Encounter: Payer: Self-pay | Admitting: Neurology

## 2022-05-17 ENCOUNTER — Telehealth: Payer: Self-pay

## 2022-05-17 ENCOUNTER — Ambulatory Visit (INDEPENDENT_AMBULATORY_CARE_PROVIDER_SITE_OTHER): Payer: BC Managed Care – PPO | Admitting: Neurology

## 2022-05-17 VITALS — BP 130/88 | HR 88 | Resp 18 | Ht 68.0 in | Wt 220.0 lb

## 2022-05-17 DIAGNOSIS — G43009 Migraine without aura, not intractable, without status migrainosus: Secondary | ICD-10-CM | POA: Diagnosis not present

## 2022-05-17 NOTE — Telephone Encounter (Signed)
ERROr

## 2022-05-17 NOTE — Patient Instructions (Signed)
Start vyepti 100mg  every 3 months Earliest onset of migraine, take Trudhesa nasal spray - 1 spray in each nostril.  May repeat in one hour. Follow up 4 months.

## 2022-05-18 ENCOUNTER — Telehealth: Payer: Self-pay | Admitting: Pharmacy Technician

## 2022-05-18 NOTE — Telephone Encounter (Addendum)
Auth Submission: APPROVED Payer: [employer] [place] Medication & CPT/J Code(s) submitted: Vyepti (Eptinezumab) [id number] Route of submission (phone, fax, portal):  Phone # [phone] Fax # [phone] Auth type: Buy/Bill Units/visits requested: 100MG  Q3MO Reference number: [id number] Approval from: 05/18/22  to  05/19/23

## 2022-05-19 NOTE — Telephone Encounter (Signed)
Patient advised of Pending PA.

## 2022-05-24 ENCOUNTER — Encounter: Payer: Self-pay | Admitting: Neurology

## 2022-05-24 NOTE — Telephone Encounter (Signed)
Ok, my error. thanks

## 2022-05-27 ENCOUNTER — Telehealth: Payer: Self-pay

## 2022-05-27 NOTE — Telephone Encounter (Signed)
[facility] approved for Vyepti per note 05/18/22.   Please accept a call from [facility] to schedule.

## 2022-05-30 ENCOUNTER — Encounter: Payer: Self-pay | Admitting: *Deleted

## 2022-06-03 ENCOUNTER — Other Ambulatory Visit: Payer: Self-pay | Admitting: Neurology

## 2022-06-28 ENCOUNTER — Ambulatory Visit (INDEPENDENT_AMBULATORY_CARE_PROVIDER_SITE_OTHER): Payer: BC Managed Care – PPO

## 2022-06-28 VITALS — BP 141/94 | HR 82 | Temp 97.9°F | Resp 82 | Ht 68.0 in | Wt 220.6 lb

## 2022-06-28 DIAGNOSIS — G43009 Migraine without aura, not intractable, without status migrainosus: Secondary | ICD-10-CM

## 2022-06-28 MED ORDER — SODIUM CHLORIDE 0.9 % IV SOLN
100.0000 mg | Freq: Once | INTRAVENOUS | Status: AC
  Administered 2022-06-28: 100 mg via INTRAVENOUS
  Filled 2022-06-28: qty 1

## 2022-06-28 NOTE — Progress Notes (Signed)
Diagnosis: Migraines  Provider:  Marshell Garfinkel MD  Procedure: Infusion  IV Type: Peripheral, IV Location: L Hand  Vyepti (Eptinezumab-jjmr), Dose: 100 mg  Infusion Start Time: 9201  Infusion Stop Time: 1440  Post Infusion IV Care: Observation period completed and Peripheral IV Discontinued  Discharge: Condition: Good, Destination: Home . AVS provided to patient.   Performed by:  Koren Shiver, RN

## 2022-08-18 ENCOUNTER — Encounter: Payer: Self-pay | Admitting: *Deleted

## 2022-08-29 ENCOUNTER — Other Ambulatory Visit: Payer: Self-pay | Admitting: Neurology

## 2022-09-14 ENCOUNTER — Other Ambulatory Visit: Payer: Self-pay | Admitting: Family Medicine

## 2022-09-14 DIAGNOSIS — K219 Gastro-esophageal reflux disease without esophagitis: Secondary | ICD-10-CM

## 2022-09-27 ENCOUNTER — Ambulatory Visit: Payer: BC Managed Care – PPO

## 2022-10-01 ENCOUNTER — Other Ambulatory Visit: Payer: Self-pay | Admitting: Family Medicine

## 2022-10-11 ENCOUNTER — Ambulatory Visit: Payer: BC Managed Care – PPO | Admitting: Neurology

## 2022-10-11 ENCOUNTER — Other Ambulatory Visit: Payer: Self-pay | Admitting: Neurology

## 2022-11-27 ENCOUNTER — Other Ambulatory Visit: Payer: Self-pay | Admitting: Neurology

## 2022-12-07 ENCOUNTER — Encounter: Payer: Self-pay | Admitting: Neurology

## 2023-02-22 ENCOUNTER — Other Ambulatory Visit: Payer: Self-pay | Admitting: Neurology

## 2023-02-22 ENCOUNTER — Ambulatory Visit (INDEPENDENT_AMBULATORY_CARE_PROVIDER_SITE_OTHER): Payer: BC Managed Care – PPO | Admitting: Neurology

## 2023-02-22 ENCOUNTER — Encounter: Payer: Self-pay | Admitting: Neurology

## 2023-02-22 ENCOUNTER — Telehealth: Payer: Self-pay | Admitting: Neurology

## 2023-02-22 VITALS — BP 160/105 | HR 62 | Ht 68.0 in | Wt 217.4 lb

## 2023-02-22 DIAGNOSIS — M47812 Spondylosis without myelopathy or radiculopathy, cervical region: Secondary | ICD-10-CM

## 2023-02-22 DIAGNOSIS — R258 Other abnormal involuntary movements: Secondary | ICD-10-CM

## 2023-02-22 DIAGNOSIS — D689 Coagulation defect, unspecified: Secondary | ICD-10-CM

## 2023-02-22 DIAGNOSIS — R519 Headache, unspecified: Secondary | ICD-10-CM | POA: Diagnosis not present

## 2023-02-22 DIAGNOSIS — G43711 Chronic migraine without aura, intractable, with status migrainosus: Secondary | ICD-10-CM

## 2023-02-22 DIAGNOSIS — R9082 White matter disease, unspecified: Secondary | ICD-10-CM | POA: Diagnosis not present

## 2023-02-22 DIAGNOSIS — G1229 Other motor neuron disease: Secondary | ICD-10-CM

## 2023-02-22 DIAGNOSIS — R270 Ataxia, unspecified: Secondary | ICD-10-CM

## 2023-02-22 DIAGNOSIS — Z8639 Personal history of other endocrine, nutritional and metabolic disease: Secondary | ICD-10-CM

## 2023-02-22 DIAGNOSIS — H539 Unspecified visual disturbance: Secondary | ICD-10-CM | POA: Diagnosis not present

## 2023-02-22 DIAGNOSIS — R292 Abnormal reflex: Secondary | ICD-10-CM

## 2023-02-22 DIAGNOSIS — R51 Headache with orthostatic component, not elsewhere classified: Secondary | ICD-10-CM

## 2023-02-22 DIAGNOSIS — G8929 Other chronic pain: Secondary | ICD-10-CM

## 2023-02-22 DIAGNOSIS — M4802 Spinal stenosis, cervical region: Secondary | ICD-10-CM

## 2023-02-22 DIAGNOSIS — M5412 Radiculopathy, cervical region: Secondary | ICD-10-CM

## 2023-02-22 DIAGNOSIS — R29898 Other symptoms and signs involving the musculoskeletal system: Secondary | ICD-10-CM

## 2023-02-22 DIAGNOSIS — M542 Cervicalgia: Secondary | ICD-10-CM

## 2023-02-22 DIAGNOSIS — R0683 Snoring: Secondary | ICD-10-CM

## 2023-02-22 DIAGNOSIS — R252 Cramp and spasm: Secondary | ICD-10-CM

## 2023-02-22 DIAGNOSIS — S065XAA Traumatic subdural hemorrhage with loss of consciousness status unknown, initial encounter: Secondary | ICD-10-CM

## 2023-02-22 MED ORDER — ALPRAZOLAM 0.25 MG PO TABS: ORAL_TABLET | ORAL | 0 refills | Status: DC

## 2023-02-22 NOTE — Patient Instructions (Addendum)
Cerebral hemorrhage: unprovoked MRI brain w/wo contrast Bloodwork for clotting disorders Cerebral Angiogram  If nothing found, send to hematology  Chronic Migraines:   Patient is on Fioricet and oxycodone 2 medications that are well-known to cause medication rebound which is controversial.  But he went off for 2 months without any changes.   Wakes up with headaches, snoring, fatigue, doesn't feel refreshed waking up. Sleep evaluation   MRI brain w/wo contrast: abormal white matter changes, SDH seen on CT, stroke and MS protocol  Continue Ajovy for right now, will need another appointment to discuss intractable migraines after workup  Chronic neck pain, been under care of pcp, neuro, chiropractor > 5 years, worsening, cervicogenic headaches, radiculopathy left arm shooting pain, ataxia check for myelopathy: MRI cervical spine  Xanax for MRI  Alprazolam Tablets What is this medication? ALPRAZOLAM (al PRAY zoe lam) treats anxiety. It works by [employer] system calm down. It belongs to a group of medications called benzodiazepines. This medicine may be used for other purposes; ask your health care provider or pharmacist if you have questions. COMMON BRAND NAME(S): Xanax What should I tell my care team before I take this medication? They need to know if you have any of these conditions: Kidney disease Liver disease Lung disease, such as asthma or breathing problems Mental health condition Seizures Substance use disorder Suicidal thoughts, plans, or attempt by you or a family member An unusual or allergic reaction to alprazolam, other medications, foods, dyes, or preservatives Pregnant or trying to get pregnant Breastfeeding How should I use this medication? Take this medication by mouth. Take it as directed on the prescription label. Do not take it more often than directed. Keep taking it unless your care team tells you to stop. A special MedGuide will be given to you by  the pharmacist with each prescription and refill. Be sure to read this information carefully each time. Talk to your care team about the use of this medication in children. Special care may be needed. People 65 years and older may have a stronger reaction and need a smaller dose. Overdosage: If you think you have taken too much of this medicine contact a poison control center or emergency room at once. NOTE: This medicine is only for you. Do not share this medicine with others. What if I miss a dose? If you miss a dose, take it as soon as you can. If it is almost time for your next dose, take only that dose. Do not take double or extra doses. What may interact with this medication? Do not take this medication with any of the following: Adagrasib Certain antivirals for HIV or hepatitis Certain medications for fungal infections, such as ketoconazole, itraconazole, or posaconazole Clarithromycin Grapefruit juice Opioid medications for cough Sodium oxybate This medication may also interact with the following: Alcohol Antihistamines for allergy, cough and cold Certain medications for anxiety or sleep Certain medications for depression, such as amitriptyline, fluoxetine, fluvoxamine, nefazodone, sertraline Certain medications for seizures, such as carbamazepine, phenobarbital, phenytoin, primidone Cimetidine Digoxin Erythromycin Estrogen or progestin hormones General anesthetics, such as halothane, isoflurane, methoxyflurane, propofol Medications that relax muscles Opioid medications for pain Phenothiazines, such as chlorpromazine, mesoridazine, prochlorperazine, thioridazine This list may not describe all possible interactions. Give your health care provider a list of all the medicines, herbs, non-prescription drugs, or dietary supplements you use. Also tell them if you smoke, drink alcohol, or use illegal drugs. Some items may interact with your medicine. What should I watch  for while  using this medication? Visit your care team for regular checks on your progress. Tell your care team if your symptoms do not start to get better or if they get worse. Do not stop taking except on your care team's advice. You may develop a severe reaction. Your care team will tell you how much medication to take. Long term use of this medication may cause your brain and body to depend on it. This can happen even when used as directed by your care team. You and your care team will work together to determine how long you will need to take this medication. This medication may affect your coordination, reaction time, or judgment. Do not drive or operate machinery until you know how this medication affects you. Sit up or stand slowly to reduce the risk of dizzy or fainting spells. Drinking alcohol with this medication can increase the risk of these side effects. If you are taking another medication that also causes drowsiness, you may have more side effects. Give your care team a list of all medications you use. Your care team will tell you how much medication to take. Do not take more medication than directed. Get emergency help right away if you have problems breathing or unusual sleepiness. If you or your family notice any changes in your behavior, such as new or worsening depression, thoughts of harming yourself, anxiety, other unusual or disturbing thoughts, or memory loss, call your care team right away. Talk to your care team if you wish to become pregnant or think you might be pregnant. This medication can cause serious birth defects. Talk to your care team before breastfeeding. Changes to your treatment plan may be needed. What side effects may I notice from receiving this medication? Side effects that you should report to your care team as soon as possible: Allergic reactions--skin rash, itching, hives, swelling of the face, lips, tongue, or throat CNS depression--slow or shallow breathing, shortness  of breath, feeling faint, dizziness, confusion, trouble staying awake Thoughts of suicide or self-harm, worsening mood, feelings of depression Side effects that usually do not require medical attention (report to your care team if they continue or are bothersome): Change in sex drive or performance Dizziness Drowsiness Nausea This list may not describe all possible side effects. Call your doctor for medical advice about side effects. You may report side effects to FDA at 1-800-FDA-1088. Where should I keep my medication? Keep out of the reach of children and pets. This medication can be abused. Keep it in a safe place to protect it from theft. Do not share it with anyone. It is only for you. Selling or giving away this medication is dangerous and against the law. Store at room temperature between 20 and 25 degrees C (68 and 77 degrees F). Get rid of any unused medication after the expiration date. This medication may cause harm and death if it is taken by other adults, children, or pets. It is important to get rid of the medication as soon as you no longer need it, or it is expired. You can do this in two ways: Take the medication to a medication take-back program. Check with your pharmacy or law enforcement to find a location. If you cannot return the medication, check the label or package insert to see if the medication should be thrown out in the garbage or flushed down the toilet. If you are not sure, ask your care team. If it is safe to put it in  the trash, take the medication out of the container. Mix the medication with cat litter, dirt, coffee grounds, or other unwanted substance. Seal the mixture in a bag or container. Put it in the trash. NOTE: This sheet is a summary. It may not cover all possible information. If you have questions about this medicine, talk to your doctor, pharmacist, or health care provider.  2024 [employer] Standard (2022-01-21 00:00:00)

## 2023-02-22 NOTE — Progress Notes (Addendum)
[facility]    Provider:  Dr Lucia Gaskins Requesting Provider: Rebecka Apley, NP Primary Care Provider:  Shelva Majestic, MD  CC:  migraine  03/20/2023: repeat lupus antioagulant at next appointment  Recent Results (from the past 2160 hour(s))  CBC with Differential/Platelets     Status: None   Collection Time: 02/22/23 11:47 AM  Result Value Ref Range   WBC 7.7 3.4 - 10.8 x10E3/uL   RBC 5.08 4.14 - 5.80 x10E6/uL   Hemoglobin 14.8 13.0 - 17.7 g/dL   Hematocrit 65.7 84.6 - 51.0 %   MCV 88 79 - 97 fL   MCH 29.1 26.6 - 33.0 pg   MCHC 33.3 31.5 - 35.7 g/dL   RDW 96.2 95.2 - 84.1 %   Platelets 243 150 - 450 x10E3/uL   Neutrophils 60 Not Estab. %   Lymphs 30 Not Estab. %   Monocytes 7 Not Estab. %   Eos 2 Not Estab. %   Basos 1 Not Estab. %   Neutrophils Absolute 4.8 1.4 - 7.0 x10E3/uL   Lymphocytes Absolute 2.3 0.7 - 3.1 x10E3/uL   Monocytes Absolute 0.5 0.1 - 0.9 x10E3/uL   EOS (ABSOLUTE) 0.1 0.0 - 0.4 x10E3/uL   Basophils Absolute 0.0 0.0 - 0.2 x10E3/uL   Immature Granulocytes 0 Not Estab. %   Immature Grans (Abs) 0.0 0.0 - 0.1 x10E3/uL  Comprehensive metabolic panel     Status: Abnormal   Collection Time: 02/22/23 11:47 AM  Result Value Ref Range   Glucose 104 (H) 70 - 99 mg/dL   BUN 11 6 - 24 mg/dL   Creatinine, Ser 3.24 0.76 - 1.27 mg/dL   eGFR 401 >02 VO/ZDG/6.44   BUN/Creatinine Ratio 13 9 - 20   Sodium 144 134 - 144 mmol/L   Potassium 3.6 3.5 - 5.2 mmol/L   Chloride 101 96 - 106 mmol/L   CO2 27 20 - 29 mmol/L   Calcium 9.4 8.7 - 10.2 mg/dL   Total Protein 7.0 6.0 - 8.5 g/dL   Albumin 4.5 3.8 - 4.9 g/dL   Globulin, Total 2.5 1.5 - 4.5 g/dL   Bilirubin Total 0.3 0.0 - 1.2 mg/dL   Alkaline Phosphatase 92 44 - 121 IU/L   AST 37 0 - 40 IU/L   ALT 55 (H) 0 - 44 IU/L  Prothrombin gene mutation     Status: None   Collection Time: 02/22/23 11:47 AM  Result Value Ref Range   Factor II, DNA Analysis Comment     Comment: Result: c.*97G>A - Not  Detected This result is not associated with an increased risk for venous thromboembolism. See Additional Clinical Information and Comments. Additional Clinical Information: Venous thromboembolism is a multifactorial disease influenced by genetic, environmental, and circumstantial risk factors. The c.*97G>A variant in the F2 gene is a genetic risk factor for venous thromboembolism. Heterozygous carriers have a 2- to 4-fold increased risk for venous thromboembolism. Homozygotes for the c.*97G>A variant are rare. The annual risk of VTE in homozygotes has been reported to be 1.1%/year. Individuals who carry both a c.*97G>A variant in the F2 gene and a c.1601G>A (p. Arg534Gln) variant in the F5 gene (commonly referred to as Factor V Leiden) have an approximately 20- fold increased risk for venous thromboembolism. Risks are likely to be even higher in more complex genotype combinations involving the F2 c.*97G>A variant and Factor V Leiden (PMID: 0347425 7). Additional risk factors include but are not limited to: deficiency of protein C, protein S, or antithrombin  III, age, male sex, personal or family history of deep vein thromboembolism, smoking, surgery, prolonged immobilization, malignant neoplasm, tamoxifen treatment, raloxifene treatment, oral contraceptive use, hormone replacement therapy, and pregnancy. Management of thrombotic risk and thrombotic events should follow established guidelines and fit the clinical circumstance. This result cannot predict the occurrence or recurrence of a thrombotic event. Comments: Genetic counseling is recommended to discuss the potential clinical implications of positive results, as well as recommendations for testing family members. Genetic Coordinators are available for health care providers to discuss results at [phone]). Test Details: Variant analyzed: c.*97G>A, previously referred to as G20210A Methods/Limitations: DNA analysis of  the F2 gene (NM_000506.5) wa s performed by PCR amplification followed by restriction enzyme analysis. The diagnostic sensitivity is >99%. Results must be combined with clinical information for the most accurate interpretation. Molecular-based testing is highly accurate, but as in any laboratory test, diagnostic errors may occur. False positive or false negative results may occur for reasons that include genetic variants, blood transfusions, bone marrow transplantation, somatic or tissue-specific mosaicism, mislabeled samples, or erroneous representation of family relationships. This test was developed and its performance characteristics determined by [facility]. It has not been cleared or approved by the Food and Drug Administration. References: Dewitt Hoes [facility], Valentina Lucks [facility]; [facility]G Professional Practice and Guidelines Committee. Addendum: [employer] consensus statement on factor V Leiden mutation testing. Genet Med. 2021 Mar 5. doi: 10.1038/s41436-021-01 108-x. PMID: 54098119. Cherrie Gauze. Prothrombin Thrombophilia. 2006 Jul 25 [Updated 2021 Feb 4]. In: Bufford Buttner, Ardinger HH, Pagon RA, et al., editors. GeneReviews(R) [Internet]. [address] ([place]): [place], [place]; 1478-2956. Available from: [email or web address] Nita Sickle, Blanca Friend, Alvie Heidelberg CS; [facility] Laboratory Quality Assurance Committee. Venous thromboembolism laboratory testing (factor V Leiden and factor II c.*97G>A), 2018 update: a technical standard of the [employer] of [employer] and [employer]omics ([facility]). Genet Med. 2018 Dec;20(12):1489-1498. doi: 10.1038/s41436-(318)[id number]-z. Epub 2018 Oct 5. PMID: 21308657.    Reviewed by: Comment     Comment: Technical Component performed at [employer] Professional Component performed by: [employer] [place], Ph.D.,  [facility] Director, Molecular Genetics [phone] [place] [address]   Factor 5 leiden     Status: None   Collection Time: 02/22/23 11:47 AM  Result Value Ref Range   Factor V Leiden Comment     Comment: Result: c.1601G>A (p.Arg534Gln) - Not Detected This result is not associated with an increased risk for venous thromboembolism. See Additional Clinical Information and Comments. Additional Clinical Information: Venous thromboembolism is a multifactorial disease influenced by genetic, environmental, and circumstantial risk factors. The c.1601G>A (p. Arg534Gln) variant in the F5 gene, commonly referred to as Factor V Leiden, is a genetic risk factor for venous thromboembolism. Heterozygous carriers of this variant have a 6- to 8-fold increased risk for venous thromboembolism. Individuals homozygous for this variant (ie, with a copy of the variant on each chromosome) have an approximately 80-fold increased risk for venous thromboembolism. Individuals who carry both a c.*97G>A variant in the F2 gene and Factor V Leiden have an approximately 20-fold increased risk for venous thromboembolism. Risks are likely to be even higher in more complex genotype combinations involving t he F2 c.*97G>A variant and Factor V Leiden (PMID: 41324401). Additional risk factors include but are not limited to: deficiency of protein C, protein S, or antithrombin III, age, male sex, personal or family history of deep vein thromboembolism, smoking, surgery,  prolonged immobilization, malignant neoplasm, tamoxifen treatment, raloxifene treatment, oral contraceptive use, hormone replacement therapy, and pregnancy. Management of thrombotic risk and thrombotic events should follow established guidelines and fit the clinical circumstance. This result cannot predict the occurrence or recurrence of a thrombotic event. Comment: Genetic counseling is recommended to discuss the potential clinical implications  of positive results, as well as recommendations for testing family members. Genetic Coordinators are available for health care providers to discuss results at [phone]). Test Details: Variant Analyzed: c.1601G>A (p. Arg534Gln), referred to as Factor V Leid en Methods/Limitations: DNA analysis of the F5 gene (NM_000130.5) was performed by PCR amplification followed by restriction enzyme analysis. The diagnostic sensitivity is >99%. Results must be combined with clinical information for the most accurate interpretation. Molecular-based testing is highly accurate, but as in any laboratory test, diagnostic errors may occur. False positive or false negative results may occur for reasons that include genetic variants, blood transfusions, bone marrow transplantation, somatic or tissue-specific mosaicism, mislabeled samples, or erroneous representation of family relationships. This test was developed and its performance characteristics determined by [facility]. It has not been cleared or approved by the Food and Drug Administration. References: Dewitt Hoes [place], Valentina Lucks [facility]; [facility] Professional Practice and Guidelines Committee. Addendum: [employer] consensus statement on factor V Leide n mutation testing. Genet Med. 2021 Mar 5. doi: 10.1038/s41436-021-01108-x. PMID: 96045409. Cherrie Gauze. Factor V Leiden Thrombophilia. 1999 May 14 (Updated 2018 Jan 4). In: Bufford Buttner, Ardinger HH, Pagon RA, et al., editors. GeneReviews(R) (Internet). [address] ([place]): [place] of [place], [place]; 8119-1478. Available from: [email or web address] Nita Sickle, Blanca Friend, Alvie Heidelberg CS; [facility] Laboratory Quality Assurance Committee. Venous thromboembolism laboratory testing (factor V Leiden and factor II c.*97G>A), 2018 update: a technical standard of the [employer]  ([facility]). Genet Med. 2018 Dec;20(12):1489-1498. doi: 10.1038/s41436-(812) [id number]-z. Epub 2018 Oct 5. PMID: 29562130.    Reviewed By: Comment     Comment: Technical Component performed at [employer] Professional Component performed by: [employer] Alpha Gula, Ph.D., [facility] Director, Molecular Genetics [address] [place] [address]   Homocysteine     Status: None   Collection Time: 02/22/23 11:47 AM  Result Value Ref Range   Homocysteine 7.2 0.0 - 14.5 umol/L  Beta-2-glycoprotein i abs, IgG/M/A     Status: None   Collection Time: 02/22/23 11:47 AM  Result Value Ref Range   Beta-2 Glyco I IgG <9 0 - 20 GPI IgG units    Comment: The reference interval reflects a 3SD or 99th percentile interval, which is thought to represent a potentially clinically significant result in accordance with the International Consensus Statement on the classification criteria for definitive antiphospholipid syndrome (APS). J Thromb Haem 2006;4:295-306.    Beta-2 Glyco 1 IgA <9 0 - 25 GPI IgA units    Comment: The reference interval reflects a 3SD or 99th percentile interval, which is thought to represent a potentially clinically significant result in accordance with the International Consensus Statement on the classification criteria for definitive antiphospholipid syndrome (APS). J Thromb Haem 2006;4:295-306.    Beta-2 Glyco 1 IgM <9 0 - 32 GPI IgM units    Comment: The reference interval reflects a 3SD or 99th percentile interval, which is thought to represent a potentially clinically significant result in accordance with the International Consensus Statement on the classification criteria for definitive antiphospholipid syndrome (APS). J Thromb Haem 2006;4:295-306.  Lupus anticoagulant     Status: Abnormal   Collection Time: 02/22/23 11:47 AM  Result Value Ref Range   dPT 31.7 0.0 - 47.6 sec   dPT Confirm Ratio 0.86 0.00 - 1.34 Ratio   Thrombin Time 18.4 0.0 -  23.0 sec   PTT Lupus Anticoagulant 46.2 (H) 0.0 - 43.5 sec   Dilute Viper Venom Time 31.5 0.0 - 47.0 sec   Lupus Anticoag Interp Comment:     Comment: No lupus anticoagulant was detected. Results suggest the presence of an inhibitor.  The presence of heparin, which is a non-specific inhibitor, may cause this pattern of results. Since the PTT-LA was extended and the dRVVT was within normal limits, a specific inhibitor to factor VIII, IX, XI, or XII cannot be excluded. It should be noted that mixing studies performed on samples with minimally extended PTT-LA results can be equivocal.  Normal plasma can overcome weak inhibitors, also resulting in a correction of the mixing study. As antibody titers may fluctuate with time, repeat testing may be indicated and ideally should be performed in the absence of anticoagulant therapy.   Protein C activity     Status: None   Collection Time: 02/22/23 11:47 AM  Result Value Ref Range   Protein C Activity 120 73 - 180 %  Protein C, total     Status: None   Collection Time: 02/22/23 11:47 AM  Result Value Ref Range   Protein C Antigen 111 60 - 150 %  Protein S activity     Status: None   Collection Time: 02/22/23 11:47 AM  Result Value Ref Range   Protein S Activity 106 63 - 140 %    Comment: Protein S activity may be falsely increased (masking an abnormal, low result) in patients receiving direct Xa inhibitor (e.g., rivaroxaban, apixaban, edoxaban) or a direct thrombin inhibitor (e.g., dabigatran) anticoagulant treatment due to assay interference by these drugs.   Protein S, total     Status: None   Collection Time: 02/22/23 11:47 AM  Result Value Ref Range   Protein S Ag, Total 105 60 - 150 %    Comment: This test was developed and its performance characteristics determined by [facility]. It has not been cleared or approved by the Food and Drug Administration.   Antithrombin III     Status: None   Collection Time: 02/22/23 11:47 AM  Result  Value Ref Range   AntiThromb III Func 118 75 - 135 %    Comment: Direct Xa inhibitor anticoagulants such as rivaroxaban, apixaban and edoxaban will lead to spuriously elevated antithrombin activity levels possibly masking a deficiency.   PTT-LA Mix     Status: Abnormal   Collection Time: 02/22/23 11:47 AM  Result Value Ref Range   PTT-LA Mix 42.9 (H) 0.0 - 40.5 sec  Hexagonal Phase Phospholipid     Status: None   Collection Time: 02/22/23 11:47 AM  Result Value Ref Range   Hexagonal Phase Phospholipid 3 0 - 11 sec  TSH Rfx on Abnormal to Free T4     Status: None   Collection Time: 02/22/23 11:52 AM  Result Value Ref Range   TSH 1.230 0.450 - 4.500 uIU/mL  Cardiolipin antibodies, IgG, IgM, IgA     Status: None   Collection Time: 02/22/23 11:54 AM  Result Value Ref Range   Anticardiolipin IgG <9 0 - 14 GPL U/mL    Comment:  Negative:              <15                           Indeterminate:     15 - 20                           Low-Med Positive: >20 - 80                           High Positive:         >80    Anticardiolipin IgM <9 0 - 12 MPL U/mL    Comment:                           Negative:              <13                           Indeterminate:     13 - 20                           Low-Med Positive: >20 - 80                           High Positive:         >80    Anticardiolipin IgA <9 0 - 11 APL U/mL    Comment:                           Negative:              <12                           Indeterminate:     12 - 20                           Low-Med Positive: >20 - 80                           High Positive:         >80      HPI:  Steven Holland is a 52 y.o. male here as requested by Rebecka Apley, NP for intractable chronic migraine.has Hypogonadism male; ED (erectile dysfunction); HTN (hypertension); Migraine headache with aura; AR (allergic rhinitis); GERD (gastroesophageal reflux disease); Irritable bowel syndrome (IBS);  Obesity, Class I, BMI 30-34.9; Hyperlipidemia; Hyperglycemia; Emphysematous gastritis; Tonsillith; Anxiety state; Nonallopathic lesion of thoracic region; and Migraine without aura and without status migrainosus, not intractable on their problem list.  Also chronic tinnitus.  Recent subdural hematoma seen by neurology last in April 2023.  Just started Ajovy at that time.  I reviewed Dr. Erasmo Leventhal notes patient has intractable chronic migraines which have been ongoing, he already saw neurology but missed last appointment due to a migraine that morning, migraines occur barometric pressure, he has had headaches upwards of 4 days in a row, he saw Dr. Everlena [facility] at [facility] neurology he takes hydrocodone 20 tabs per month for severe pain 7 out of  10, it appears they check CBC, CMP, testosterone and A1c.  Per Dr. Moises Blood notes last seen September 2023, he was diagnosed with chronic migraine without aura without status migrainosus not intractable.  Also occipital headaches/occipital neuralgia or cervicogenic with a history of neck trauma.  Dr. Everlena Cooper started him on Vyepti 100 mg every 3 months.  Given Fioricet with codeine as rescue.  He also just saw [facility] for SDH and neurologistDr. Everlena Cooper.  He saw them in April 2024 secondary to a subdural hematoma identified on his head CT.  The week prior the headache was left-sided and unrelenting taking Benadryl to help him sleep also taking Vicodin, Toradol, steroid pack, after that CT was performed and identified a subdural hematoma, no recent head injury, excessive NSAID use or aspirin, he was recently started on Ajovy for headache prevention but was waiting for authorization.  Patient is here and reports his migraines started in his early 46s and can be the back of the head or bitemporal, or on the right side, pounding throbbing pulsating, can be moderate to severe usually 5-6 out of 10 but rarely higher but can be very bad, he denies any auras or any post rooms or post rooms, he  has nausea, photo and phonophobia, can last all day 24 to 72 hours but with pain relief he can terminate it within 2 to 4 hours.  Sometimes he does wake up with headaches.  He has headaches 4 to 5 days a week and changes of barometric pressure is a trigger.  The pain medication does help.  He has tried an extensive list of medications.he recently had a SDH on the left fronto-parietal after 8 day severe head pain though it was a migraine. Family hx paternal grandfather dieing of an aneurysm. No falls, no trauma, no exercise, nothing out of the ordinary. Wakes up with headaches, snoring, fatigue, doesn't feel refreshed waking up. Chronic neck pain, been under care of pcp, neuro, chiropractor > 5 years, worsening, cervicogenic headaches, radiculopathy left arm shooting pain. No other focal neurologic deficits, associated symptoms, inciting events or modifiable factors.   Reviewed notes, labs and imaging from outside physicians, which showed:was able to   CT head 12/06/2022: IMPRESSION: Relatively thin mixed attenuation subdural hemorrhage along the left cerebral hemisphere, predominantly overlying the left frontal and parietal lobes, probably subacute. It measures up to approximately 7 mm in greatest thickness along the anterolateral left frontal lobe. Mild mass effect.   CT head 02/08/2023:  IMPRESSION: Interval resolution of the prior left subdural hemorrhage.  Electronically Signed by: Italy Holder, MD on 02/09/2023 9:51 AM   01/23/2023: hgba1c 6.2,  Cmp with alt 57, glucose 129 otherwise normal Cbc normal   From a thorough review of records, medications that he is tried and failed or had side effects to for at least 2 months include: Baclofen, buspirone, Fioricet, magnesium, nortriptyline, Zofran, beta-blockers, topiramate, nortriptyline, Emgality, Aimovig and Botox.  Vyepti was prescribed unclear if it was ever started  Dr. Adriana Mccallum notes: Frequency of abortive medication: Fioricet with codeine 2-3 days a  week Rescue protocol:  Bernita Raisin, Fioricet with codeine Current NSAIDS:  none Current analgesics:  Fioricet with codeine Current triptans:  none Current ergotamine: None Current anti-emetic: Zofran 8 mg Current muscle relaxants: None Current anti-anxiolytic: buspirone Current sleep aide: None Current Antihypertensive medications: Hydrochlorothiazide Current Antidepressant medications: Nortriptyline 100 mg at bedtime Current Anticonvulsant medications: None Current anti-CGRP:  Ubrelvy 100mg  (not helpful) Current Vitamins/Herbal/Supplements: B12, MVI, Mg 250mg  daily, folic acid Current Antihistamines/Decongestants: None  Hormones:  testosterone Other therapy:  OMM for chronic neck pain  Past NSAIDS:  ibuprofen, Sprix NS (effective 50% of time but burns), Elyxyb Past analgesics:  Tylenol, vicodin (worked) Past abortive triptans:  Sumatriptan tablet, Maxalt, Relpax, Zembrace (all ineffective) Past ergots:  Migranal Past muscle relaxants:  no Past anti-emetic:  no Past antihypertensive medications:  bisoprolol Past antidepressant medications:  no Past anticonvulsant medications:  topiramate 100mg  (paresthesias, impotence) Past CGRP inhibitor:  Aimovig 140mg , Nurtec, Emgality, ajovy  Past vitamins/Herbal/Supplements:  no Other past therapies:  Botox (ineffective), [name]  CT head 12/2020: CLINICAL DATA:  Constant pulsatile tinnitus since 2017.   EXAM: CT ANGIOGRAPHY HEAD AND NECK   TECHNIQUE: Multidetector CT imaging of the head and neck was performed using the standard protocol during bolus administration of intravenous contrast. Multiplanar CT image reconstructions and MIPs were obtained to evaluate the vascular anatomy. Carotid stenosis measurements (when applicable) are obtained utilizing NASCET criteria, using the distal internal carotid diameter as the denominator.   CONTRAST:  75mL ISOVUE-370 IOPAMIDOL (ISOVUE-370) INJECTION 76%   COMPARISON:  None.   FINDINGS: CT HEAD  FINDINGS   Brain: No evidence of acute infarction, hemorrhage, hydrocephalus, extra-axial collection or mass lesion/mass effect.   Vascular: See below   Skull: Normal. Negative for fracture or focal lesion.   Sinuses: Clear   Orbits: Negative   Review of the MIP images confirms the above findings   CTA NECK FINDINGS   Aortic arch: Normal with 3 vessel branching.   Right carotid system: Mild atheromatous plaque at the bifurcation. No stenosis or ulceration.   Left carotid system: Vessels are smooth and widely patent   Vertebral arteries: No proximal subclavian stenosis. Both vertebral arteries are smooth and widely patent to the dura.   Skeleton: Negative   Other neck: Subcentimeter thyroid nodules measuring up to 6 mm on the right. No followup recommended (ref: J Am Coll Radiol. 2015 Feb;12(2): 143-50).   Upper chest: Negative   Review of the MIP images confirms the above findings   CTA HEAD FINDINGS   Anterior circulation: Vessels are smooth and widely patent. Negative for aneurysm or visible vascular malformation.   Posterior circulation: Vessels are smooth and widely patent. Negative for aneurysm or visible vascular malformation.   Venous sinuses: Inadvertent preferential venous timing. The left transverse sigmoid sinus is non dominant and the transverse sinus essentially begins at the draining vein of Labbe where there is likely also an arachnoid granulation.   Anatomic variants: None significant.   Review of the MIP images confirms the above findings   IMPRESSION: 1. No explanation symptoms. 2. Early atherosclerosis.     Electronically Signed   By: Marnee Spring M.D.   On: 12/25/2020 07:26  CT neck 12/2020: Narrative & Impression  CLINICAL DATA:  Constant pulsatile tinnitus since 2017.   EXAM: CT ANGIOGRAPHY HEAD AND NECK   TECHNIQUE: Multidetector CT imaging of the head and neck was performed using the standard protocol during bolus  administration of intravenous contrast. Multiplanar CT image reconstructions and MIPs were obtained to evaluate the vascular anatomy. Carotid stenosis measurements (when applicable) are obtained utilizing NASCET criteria, using the distal internal carotid diameter as the denominator.   CONTRAST:  75mL ISOVUE-370 IOPAMIDOL (ISOVUE-370) INJECTION 76%   COMPARISON:  None.   FINDINGS: CT HEAD FINDINGS   Brain: No evidence of acute infarction, hemorrhage, hydrocephalus, extra-axial collection or mass lesion/mass effect.   Vascular: See below   Skull: Normal. Negative for fracture or focal lesion.  Sinuses: Clear   Orbits: Negative   Review of the MIP images confirms the above findings   CTA NECK FINDINGS   Aortic arch: Normal with 3 vessel branching.   Right carotid system: Mild atheromatous plaque at the bifurcation. No stenosis or ulceration.   Left carotid system: Vessels are smooth and widely patent   Vertebral arteries: No proximal subclavian stenosis. Both vertebral arteries are smooth and widely patent to the dura.   Skeleton: Negative   Other neck: Subcentimeter thyroid nodules measuring up to 6 mm on the right. No followup recommended (ref: J Am Coll Radiol. 2015 Feb;12(2): 143-50).   Upper chest: Negative   Review of the MIP images confirms the above findings   CTA HEAD FINDINGS   Anterior circulation: Vessels are smooth and widely patent. Negative for aneurysm or visible vascular malformation.   Posterior circulation: Vessels are smooth and widely patent. Negative for aneurysm or visible vascular malformation.   Venous sinuses: Inadvertent preferential venous timing. The left transverse sigmoid sinus is non dominant and the transverse sinus essentially begins at the draining vein of Labbe where there is likely also an arachnoid granulation.   Anatomic variants: None significant.   Review of the MIP images confirms the above findings    IMPRESSION: 1. No explanation symptoms. 2. Early atherosclerosis.    Review of Systems: Patient complains of symptoms per HPI as well as the following symptoms chronic intractable headaches. Pertinent negatives and positives per HPI. All others negative.   Social History   Socioeconomic History   Marital status: Married    Spouse name: Not on file   Number of children: 0   Years of education: 4   Highest education level: Not on file  Occupational History   Occupation: Futures trader  Tobacco Use   Smoking status: Never   Smokeless tobacco: Never  Vaping Use   Vaping Use: Never used  Substance and Sexual Activity   Alcohol use: Yes    Comment: occasional liquor/wine   Drug use: No   Sexual activity: Yes  Other Topics Concern   Not on file  Social History Narrative   Married wife [name]. No children.       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 Very busy at work. Enjoys Psychologist, educational. Household projects and Holiday representative.       Caffeine 1-2 sodas day/ exercise a little   One story home   Right handed   Social Determinants of Health   Financial Resource Strain: Not on file  Food Insecurity: Not on file  Transportation Needs: Not on file  Physical Activity: Not on file  Stress: Not on file  Social Connections: Not on file  Intimate Partner Violence: Not on file    Family History  Problem Relation Age of Onset   Migraines Mother    Arthritis Mother        knee replacements and back   Heart disease Father        30s reportedly- patient no issues   Benign prostatic hyperplasia Father    Other Brother        hypogonadism/low T both brothers   Migraines Brother        bothbrothers   Prostate cancer Maternal Grandfather    Diabetes Paternal Grandmother    Colon cancer Neg Hx    Rectal cancer Neg Hx    Stomach cancer Neg Hx    Esophageal cancer Neg Hx  Past Medical History:  Diagnosis Date   Allergy     Anxiety    Brain bleed (HCC)    11-2022  [facility]   Chicken pox    Chronic headaches    Depression    GERD (gastroesophageal reflux disease)    Hyperlipidemia    Hypertension    IBS (irritable bowel syndrome)    Low testosterone    Obesity    Syncope    related to IBS and severe nausea, no current problems    Patient Active Problem List   Diagnosis Date Noted   Migraine without aura and without status migrainosus, not intractable 05/17/2022   Nonallopathic lesion of thoracic region 12/01/2020   Anxiety state 12/31/2019   Tonsillith 05/06/2019   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   Hyperglycemia 05/09/2017   Hyperlipidemia 11/04/2016   Obesity, Class I, BMI 30-34.9 10/22/2015   Hypogonadism male 12/18/2011   ED (erectile dysfunction) 12/18/2011   HTN (hypertension) 12/18/2011   Migraine headache with aura 12/18/2011   AR (allergic rhinitis) 12/18/2011   GERD (gastroesophageal reflux disease) 12/18/2011   Irritable bowel syndrome (IBS) 12/18/2011    Past Surgical History:  Procedure Laterality Date   EYE SURGERY Right 06/23/2020   Retina surgery   NO PAST SURGERIES      Current Outpatient Medications  Medication Sig Dispense Refill   AJOVY 225 MG/1.5ML SOAJ      ALPRAZolam (XANAX) 0.25 MG tablet Take 1-2 tabs (0.25mg -0.50mg ) 30-60 minutes before procedure. May repeat if needed.Do not drive. 4 tablet 0   atropine-phenobarbital-scopolamine-hyoscyamine (DONNATAL) 16.2 MG TABS Take 1 tablet by mouth as needed.     baclofen (LIORESAL) 10 MG tablet TAKE 1 TABLET BY MOUTH THREE TIMES A DAY AS NEEDED FOR MUSCLE SPASM 270 tablet 0   busPIRone (BUSPAR) 7.5 MG tablet Take 1 tablet (7.5 mg total) by mouth 2 (two) times daily as needed. 180 tablet 3   co-enzyme Q-10 30 MG capsule Take 30 mg by mouth 3 (three) times daily.     folic acid (FOLVITE) 400 MCG tablet Take 400 mcg by mouth daily.     hydrochlorothiazide (HYDRODIURIL) 25 MG tablet Take 1 tablet (25 mg total) by mouth daily.  90 tablet 3   HYDROcodone-acetaminophen (VICODIN) 5-325 mg TABS tablet      Magnesium 250 MG TABS Take 250 mg by mouth daily.      Multiple Vitamin (MULITIVITAMIN WITH MINERALS) TABS Take 1 tablet by mouth daily.     omeprazole (PRILOSEC) 20 MG capsule Take 1 capsule (20 mg total) by mouth daily. 90 capsule 3   ondansetron (ZOFRAN) 8 MG tablet TAKE 1 TABLET BY MOUTH EVERY 8 HOURS AS NEEDED 9 tablet 11   POTASSIUM CITRATE ER PO Take 400 mg by mouth daily.     Tadalafil 2.5 MG TABS TAKE 1 TABLET BY MOUTH EVERY DAY 90 tablet 3   Testosterone 20.25 MG/ACT (1.62%) GEL APPLY 4 PUMPS TOPICALLY DAILY 450 g 5   vitamin B-12 (CYANOCOBALAMIN) 1000 MCG tablet Take 1,000 mcg by mouth daily.     No current facility-administered medications for this visit.    Allergies as of 02/22/2023 - Review Complete 02/22/2023  Allergen Reaction Noted   Sulfa antibiotics Swelling 11/09/2011   Milk-related compounds Other (See Comments) 05/15/2018   Nickel Rash 08/23/2019    Vitals: BP (!) 160/105 Comment: after lab draw (10 min)  will take when gets home, and contanct pcp if still elevated  Pulse 62   Ht 5\' 8"  (1.727 m)  Wt 217 lb 6.4 oz (98.6 kg)   BMI 33.06 kg/m  Last Weight:  Wt Readings from Last 1 Encounters:  02/22/23 217 lb 6.4 oz (98.6 kg)   Last Height:   Ht Readings from Last 1 Encounters:  02/22/23 5\' 8"  (1.727 m)     Physical exam: Exam: Gen: NAD, conversant, well nourised, obese, well groomed                     CV: RRR, no MRG. No Carotid Bruits. No peripheral edema, warm, nontender Eyes: Conjunctivae clear without exudates or hemorrhage  Neuro: Detailed Neurologic Exam  Speech:    Speech is normal; fluent and spontaneous with normal comprehension.  Cognition:    The patient is oriented to person, place, and time;     recent and remote memory intact;     language fluent;     normal attention, concentration,     fund of knowledge Cranial Nerves:    The pupils are equal,  round, and reactive to light. The fundi are normal and spontaneous venous pulsations are present. Visual fields are full to finger confrontation. Extraocular movements are intact. Trigeminal sensation is intact and the muscles of mastication are normal. The face is symmetric. The palate elevates in the midline. Hearing intact. Voice is normal. Shoulder shrug is normal. The tongue has normal motion without fasciculations.   Coordination:    Normal finger to nose and heel to shin. Normal rapid alternating movements.   Gait:    Heel-toe and tandem gait are normal.   Motor Observation:    No asymmetry, no atrophy, and no involuntary movements noted. Tone:    Normal muscle tone.    Posture:    Posture is normal. normal erect    Strength:mild left triceps weakness. Strength is V/V in the upper and lower limbs.      Sensation: intact to LT     Reflex Exam:  DTR's:    Deep tendon reflexes in the upper and lower extremities are normal bilaterally.   Toes:    The toes are downgoing bilaterally.   Clonus: 2 beats clonus    Assessment/Plan:  Patient with chronic intractable headaches and now an unprovoked SDH.  Cerebral hemorrhage: unprovoked MRI brain w/wo contrast Bloodwork for clotting disorders Cerebral Angiogram  If nothing found, consider sending to hematology  Chronic Migraines:   Patient is on Fioricet and oxycodone 2 medications that are well-known to cause medication rebound I would advise Dr. Durene Cal possibly to decrease to lowest level if not discontinue if possible. But he went off for 2 months without any changes. Rebound headaches are also controversial.   Wakes up with headaches, snoring, fatigue, doesn't feel refreshed waking up. Sleep evaluation   MRI brain w/wo contrast: abormal white matter changes, SDH seen on CT, stroke and MS protocol   Chronic neck pain, been under care of pcp, neuro, chiropractor > 5 years, worsening, cervicogenic headaches, radiculopathy  left arm shooting pain, ataxia check for myelopathy  Orders Placed This Encounter  Procedures   MR BRAIN W WO CONTRAST   MR CERVICAL SPINE WO CONTRAST   CBC with Differential/Platelets   Comprehensive metabolic panel   TSH Rfx on Abnormal to Free T4   Cardiolipin antibodies, IgG, IgM, IgA   Prothrombin gene mutation   Factor 5 leiden   Homocysteine   Beta-2-glycoprotein i abs, IgG/M/A   Lupus anticoagulant   Protein C activity   Protein C, total   Protein S  activity   Protein S, total   Antithrombin III   Cardiolipin antibodies, IgG, IgM, IgA   Ambulatory referral to Sleep Studies   Ambulatory referral to Interventional Radiology   Meds ordered this encounter  Medications   ALPRAZolam (XANAX) 0.25 MG tablet    Sig: Take 1-2 tabs (0.25mg -0.50mg ) 30-60 minutes before procedure. May repeat if needed.Do not drive.    Dispense:  4 tablet    Refill:  0    Cc: Hemberg, Ruby Cola, NP,  Durene Cal Aldine Contes, MD  Naomie Dean, MD  [facility] [address] [place], [place] [address]  Phone [phone] Fax [phone]  I spent over 90 minutes of face-to-face and non-face-to-face time with patient on the  1. Subdural hematoma (HCC)   2. White matter abnormality on MRI of brain   3. Chronic intractable headache, unspecified headache type   4. Vision changes   5. Clotting disorder (HCC)   6. Morning headache   7. Snoring   8. 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 Cervical radiculopathy   10. Ataxia   11. Cervical stenosis of spinal canal   12. Chronic neck pain   13. Left arm weakness   14. Clonus   15. Upper motor neuron lesion (HCC)   16. Abnormal DTR (deep tendon reflex)   17. Chronic migraine without aura, with intractable migraine, so stated, with status migrainosus   18. Muscle cramp   19. History of low potassium   20. Positional headache    diagnosis.  This included previsit chart review, lab review,  study review, order entry, electronic health record documentation, patient education on the different diagnostic and therapeutic options, counseling and coordination of care, risks and benefits of management, compliance, or risk factor reduction

## 2023-02-22 NOTE — Telephone Encounter (Signed)
Pt scheduled for 90 mins MR brain w/wo & MR cervical wo contrast at [facility] for 03/07/23 at 3:30pm  [employer] auth# [id number] (02/22/23-04/22/23)

## 2023-02-23 LAB — TSH RFX ON ABNORMAL TO FREE T4: TSH: 1.23 u[IU]/mL (ref 0.450–4.500)

## 2023-02-23 LAB — CARDIOLIPIN ANTIBODIES, IGG, IGM, IGA
Anticardiolipin IgA: <9 APL U/mL (ref 0–11)
Anticardiolipin IgG: <9 GPL U/mL (ref 0–14)
Anticardiolipin IgM: <9 MPL U/mL (ref 0–12)

## 2023-02-28 ENCOUNTER — Telehealth: Payer: Self-pay | Admitting: Neurology

## 2023-02-28 NOTE — Telephone Encounter (Signed)
LVM and sent mychart msg informing pt of appt change due to MD being out 

## 2023-03-06 ENCOUNTER — Other Ambulatory Visit (HOSPITAL_COMMUNITY): Payer: Self-pay | Admitting: Interventional Radiology

## 2023-03-06 DIAGNOSIS — R292 Abnormal reflex: Secondary | ICD-10-CM

## 2023-03-06 DIAGNOSIS — R29898 Other symptoms and signs involving the musculoskeletal system: Secondary | ICD-10-CM

## 2023-03-06 DIAGNOSIS — R252 Cramp and spasm: Secondary | ICD-10-CM

## 2023-03-06 DIAGNOSIS — R258 Other abnormal involuntary movements: Secondary | ICD-10-CM

## 2023-03-06 DIAGNOSIS — G1229 Other motor neuron disease: Secondary | ICD-10-CM

## 2023-03-06 DIAGNOSIS — S065XAA Traumatic subdural hemorrhage with loss of consciousness status unknown, initial encounter: Secondary | ICD-10-CM

## 2023-03-06 DIAGNOSIS — M4802 Spinal stenosis, cervical region: Secondary | ICD-10-CM

## 2023-03-06 DIAGNOSIS — G8929 Other chronic pain: Secondary | ICD-10-CM

## 2023-03-06 DIAGNOSIS — M5412 Radiculopathy, cervical region: Secondary | ICD-10-CM

## 2023-03-07 ENCOUNTER — Other Ambulatory Visit: Payer: Self-pay | Admitting: Neurology

## 2023-03-07 ENCOUNTER — Ambulatory Visit (INDEPENDENT_AMBULATORY_CARE_PROVIDER_SITE_OTHER): Payer: BC Managed Care – PPO

## 2023-03-07 DIAGNOSIS — R270 Ataxia, unspecified: Secondary | ICD-10-CM | POA: Diagnosis not present

## 2023-03-07 DIAGNOSIS — R51 Headache with orthostatic component, not elsewhere classified: Secondary | ICD-10-CM

## 2023-03-07 DIAGNOSIS — R258 Other abnormal involuntary movements: Secondary | ICD-10-CM

## 2023-03-07 DIAGNOSIS — G43711 Chronic migraine without aura, intractable, with status migrainosus: Secondary | ICD-10-CM

## 2023-03-07 DIAGNOSIS — D689 Coagulation defect, unspecified: Secondary | ICD-10-CM

## 2023-03-07 DIAGNOSIS — S065XAA Traumatic subdural hemorrhage with loss of consciousness status unknown, initial encounter: Secondary | ICD-10-CM

## 2023-03-07 DIAGNOSIS — R9082 White matter disease, unspecified: Secondary | ICD-10-CM

## 2023-03-07 DIAGNOSIS — G1229 Other motor neuron disease: Secondary | ICD-10-CM

## 2023-03-07 DIAGNOSIS — Z8639 Personal history of other endocrine, nutritional and metabolic disease: Secondary | ICD-10-CM

## 2023-03-07 DIAGNOSIS — M47812 Spondylosis without myelopathy or radiculopathy, cervical region: Secondary | ICD-10-CM | POA: Diagnosis not present

## 2023-03-07 DIAGNOSIS — M542 Cervicalgia: Secondary | ICD-10-CM

## 2023-03-07 DIAGNOSIS — R252 Cramp and spasm: Secondary | ICD-10-CM

## 2023-03-07 DIAGNOSIS — R292 Abnormal reflex: Secondary | ICD-10-CM

## 2023-03-07 DIAGNOSIS — R29898 Other symptoms and signs involving the musculoskeletal system: Secondary | ICD-10-CM

## 2023-03-07 DIAGNOSIS — G8929 Other chronic pain: Secondary | ICD-10-CM

## 2023-03-07 DIAGNOSIS — H539 Unspecified visual disturbance: Secondary | ICD-10-CM | POA: Diagnosis not present

## 2023-03-07 DIAGNOSIS — M4802 Spinal stenosis, cervical region: Secondary | ICD-10-CM | POA: Diagnosis not present

## 2023-03-07 DIAGNOSIS — M5412 Radiculopathy, cervical region: Secondary | ICD-10-CM

## 2023-03-07 DIAGNOSIS — R519 Headache, unspecified: Secondary | ICD-10-CM

## 2023-03-07 DIAGNOSIS — R0683 Snoring: Secondary | ICD-10-CM

## 2023-03-16 ENCOUNTER — Ambulatory Visit (HOSPITAL_COMMUNITY)
Admission: RE | Admit: 2023-03-16 | Discharge: 2023-03-16 | Disposition: A | Discharge status: Home / Self Care | Payer: BC Managed Care – PPO | Source: Ambulatory Visit | Attending: Interventional Radiology | Admitting: Interventional Radiology

## 2023-03-16 DIAGNOSIS — S065XAA Traumatic subdural hemorrhage with loss of consciousness status unknown, initial encounter: Secondary | ICD-10-CM

## 2023-03-17 HISTORY — PX: IR RADIOLOGIST EVAL & MGMT: IMG5224

## 2023-03-17 LAB — COMPREHENSIVE METABOLIC PANEL
ALT: 55 IU/L — ABNORMAL HIGH (ref 0–44)
AST: 37 IU/L (ref 0–40)
Albumin: 4.5 g/dL (ref 3.8–4.9)
Alkaline Phosphatase: 92 IU/L (ref 44–121)
BUN/Creatinine Ratio: 13 (ref 9–20)
BUN: 11 mg/dL (ref 6–24)
Bilirubin Total: 0.3 mg/dL (ref 0.0–1.2)
CO2: 27 mmol/L (ref 20–29)
Calcium: 9.4 mg/dL (ref 8.7–10.2)
Chloride: 101 mmol/L (ref 96–106)
Creatinine, Ser: 0.88 mg/dL (ref 0.76–1.27)
Globulin, Total: 2.5 g/dL (ref 1.5–4.5)
Glucose: 104 mg/dL — ABNORMAL HIGH (ref 70–99)
Potassium: 3.6 mmol/L (ref 3.5–5.2)
Sodium: 144 mmol/L (ref 134–144)
Total Protein: 7 g/dL (ref 6.0–8.5)
eGFR: 103 mL/min/{1.73_m2} (ref >59)

## 2023-03-17 LAB — CBC WITH DIFFERENTIAL/PLATELET
Basophils Absolute: 0 10*3/uL (ref 0.0–0.2)
Basos: 1 %
EOS (ABSOLUTE): 0.1 10*3/uL (ref 0.0–0.4)
Eos: 2 %
Hematocrit: 44.5 % (ref 37.5–51.0)
Hemoglobin: 14.8 g/dL (ref 13.0–17.7)
Immature Grans (Abs): 0 10*3/uL (ref 0.0–0.1)
Immature Granulocytes: 0 %
Lymphocytes Absolute: 2.3 10*3/uL (ref 0.7–3.1)
Lymphs: 30 %
MCH: 29.1 pg (ref 26.6–33.0)
MCHC: 33.3 g/dL (ref 31.5–35.7)
MCV: 88 fL (ref 79–97)
Monocytes Absolute: 0.5 10*3/uL (ref 0.1–0.9)
Monocytes: 7 %
Neutrophils Absolute: 4.8 10*3/uL (ref 1.4–7.0)
Neutrophils: 60 %
Platelets: 243 10*3/uL (ref 150–450)
RBC: 5.08 x10E6/uL (ref 4.14–5.80)
RDW: 13.1 % (ref 11.6–15.4)
WBC: 7.7 10*3/uL (ref 3.4–10.8)

## 2023-03-17 LAB — PROTHROMBIN GENE MUTATION

## 2023-03-17 LAB — BETA-2-GLYCOPROTEIN I ABS, IGG/M/A
Beta-2 Glyco 1 IgA: <9 GPI IgA units (ref 0–25)
Beta-2 Glyco 1 IgM: <9 GPI IgM units (ref 0–32)
Beta-2 Glyco I IgG: <9 GPI IgG units (ref 0–20)

## 2023-03-17 LAB — LUPUS ANTICOAGULANT
Dilute Viper Venom Time: 31.5 s (ref 0.0–47.0)
PTT Lupus Anticoagulant: 46.2 s — ABNORMAL HIGH (ref 0.0–43.5)
Thrombin Time: 18.4 s (ref 0.0–23.0)
dPT Confirm Ratio: 0.86 Ratio (ref 0.00–1.34)
dPT: 31.7 s (ref 0.0–47.6)

## 2023-03-17 LAB — HOMOCYSTEINE: Homocysteine: 7.2 umol/L (ref 0.0–14.5)

## 2023-03-17 LAB — PROTEIN S ACTIVITY: Protein S Activity: 106 % (ref 63–140)

## 2023-03-17 LAB — PTT-LA MIX: PTT-LA Mix: 42.9 s — ABNORMAL HIGH (ref 0.0–40.5)

## 2023-03-17 LAB — HEXAGONAL PHASE PHOSPHOLIPID: Hexagonal Phase Phospholipid: 3 s (ref 0–11)

## 2023-03-17 LAB — ANTITHROMBIN III: AntiThromb III Func: 118 % (ref 75–135)

## 2023-03-17 LAB — FACTOR 5 LEIDEN

## 2023-03-17 LAB — PROTEIN C ACTIVITY: Protein C Activity: 120 % (ref 73–180)

## 2023-03-17 LAB — PROTEIN S, TOTAL: Protein S Ag, Total: 105 % (ref 60–150)

## 2023-03-17 LAB — PROTEIN C, TOTAL: Protein C Antigen: 111 % (ref 60–150)

## 2023-03-27 ENCOUNTER — Other Ambulatory Visit (HOSPITAL_COMMUNITY): Payer: Self-pay | Admitting: Interventional Radiology

## 2023-03-27 DIAGNOSIS — H93A9 Pulsatile tinnitus, unspecified ear: Secondary | ICD-10-CM

## 2023-03-29 ENCOUNTER — Telehealth (HOSPITAL_COMMUNITY): Payer: Self-pay

## 2023-03-29 ENCOUNTER — Other Ambulatory Visit (HOSPITAL_COMMUNITY): Payer: Self-pay | Admitting: Interventional Radiology

## 2023-03-29 DIAGNOSIS — H93A9 Pulsatile tinnitus, unspecified ear: Secondary | ICD-10-CM

## 2023-03-29 NOTE — Telephone Encounter (Signed)
Called to schedule diagnostic angiogram and CT temporal bone, no answer, left vm. AB

## 2023-04-03 ENCOUNTER — Encounter: Payer: Self-pay | Admitting: Neurology

## 2023-04-03 ENCOUNTER — Ambulatory Visit (INDEPENDENT_AMBULATORY_CARE_PROVIDER_SITE_OTHER): Payer: BC Managed Care – PPO | Admitting: Neurology

## 2023-04-03 VITALS — BP 159/92 | HR 72 | Ht 68.0 in | Wt 214.8 lb

## 2023-04-03 DIAGNOSIS — G43711 Chronic migraine without aura, intractable, with status migrainosus: Secondary | ICD-10-CM

## 2023-04-03 DIAGNOSIS — G44011 Episodic cluster headache, intractable: Secondary | ICD-10-CM | POA: Diagnosis not present

## 2023-04-03 DIAGNOSIS — R519 Headache, unspecified: Secondary | ICD-10-CM

## 2023-04-03 DIAGNOSIS — S065XAA Traumatic subdural hemorrhage with loss of consciousness status unknown, initial encounter: Secondary | ICD-10-CM
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
## 2023-04-03 NOTE — Patient Instructions (Signed)

## 2023-05-09 ENCOUNTER — Telehealth: Payer: Self-pay | Admitting: Neurology

## 2023-05-09 NOTE — Telephone Encounter (Signed)
04/26/23: LVM ag  04/26/23 [employer] no auth req via form EE

## 2023-05-12 ENCOUNTER — Telehealth (HOSPITAL_COMMUNITY): Payer: Self-pay

## 2023-05-20 ENCOUNTER — Other Ambulatory Visit: Payer: Self-pay | Admitting: Neurology

## 2023-06-08 ENCOUNTER — Encounter: Payer: Self-pay | Admitting: Neurology

## 2023-06-12 ENCOUNTER — Ambulatory Visit (HOSPITAL_COMMUNITY): Payer: BC Managed Care – PPO

## 2023-06-19 ENCOUNTER — Other Ambulatory Visit: Payer: Self-pay | Admitting: Student

## 2023-06-19 DIAGNOSIS — G43109 Migraine with aura, not intractable, without status migrainosus: Secondary | ICD-10-CM

## 2023-06-20 ENCOUNTER — Other Ambulatory Visit (HOSPITAL_COMMUNITY): Payer: Self-pay | Admitting: Interventional Radiology

## 2023-06-20 ENCOUNTER — Ambulatory Visit (HOSPITAL_COMMUNITY)
Admission: RE | Admit: 2023-06-20 | Discharge: 2023-06-20 | Disposition: A | Discharge status: Home / Self Care | Payer: BC Managed Care – PPO | Source: Ambulatory Visit | Attending: Interventional Radiology | Admitting: Interventional Radiology

## 2023-06-20 ENCOUNTER — Other Ambulatory Visit: Payer: Self-pay

## 2023-06-20 ENCOUNTER — Encounter (HOSPITAL_COMMUNITY): Payer: Self-pay

## 2023-06-20 DIAGNOSIS — H93A3 Pulsatile tinnitus, bilateral: Secondary | ICD-10-CM | POA: Insufficient documentation

## 2023-06-20 DIAGNOSIS — H93A9 Pulsatile tinnitus, unspecified ear: Secondary | ICD-10-CM

## 2023-06-20 DIAGNOSIS — G43109 Migraine with aura, not intractable, without status migrainosus: Secondary | ICD-10-CM

## 2023-06-20 HISTORY — PX: IR ANGIO VERTEBRAL SEL VERTEBRAL UNI R MOD SED: IMG5368

## 2023-06-20 HISTORY — PX: IR ANGIO INTRA EXTRACRAN SEL COM CAROTID INNOMINATE BILAT MOD SED: IMG5360

## 2023-06-20 HISTORY — PX: IR US GUIDE VASC ACCESS RIGHT: IMG2390

## 2023-06-20 LAB — BASIC METABOLIC PANEL
Anion gap: 11 (ref 5–15)
BUN: 14 mg/dL (ref 6–20)
CO2: 29 mmol/L (ref 22–32)
Calcium: 9.5 mg/dL (ref 8.9–10.3)
Chloride: 102 mmol/L (ref 98–111)
Creatinine, Ser: 1.01 mg/dL (ref 0.61–1.24)
GFR, Estimated: >60 mL/min (ref >60)
Glucose, Bld: 111 mg/dL — ABNORMAL HIGH (ref 70–99)
Potassium: 3.5 mmol/L (ref 3.5–5.1)
Sodium: 142 mmol/L (ref 135–145)

## 2023-06-20 LAB — PROTIME-INR
INR: 1 (ref 0.8–1.2)
Prothrombin Time: 13.8 s (ref 11.4–15.2)

## 2023-06-20 LAB — CBC
HCT: 39.1 % (ref 39.0–52.0)
Hemoglobin: 12.9 g/dL — ABNORMAL LOW (ref 13.0–17.0)
MCH: 28.7 pg (ref 26.0–34.0)
MCHC: 33 g/dL (ref 30.0–36.0)
MCV: 87.1 fL (ref 80.0–100.0)
Platelets: 212 10*3/uL (ref 150–400)
RBC: 4.49 MIL/uL (ref 4.22–5.81)
RDW: 13.7 % (ref 11.5–15.5)
WBC: 7.8 10*3/uL (ref 4.0–10.5)
nRBC: 0 % (ref 0.0–0.2)

## 2023-06-20 MED ORDER — SODIUM CHLORIDE 0.9 % IV SOLN: INTRAVENOUS | Status: DC

## 2023-06-20 MED ORDER — LIDOCAINE HCL 1 % IJ SOLN
20.0000 mL | Freq: Once | INTRAMUSCULAR | Status: AC
  Administered 2023-06-20: 10 mL via INTRADERMAL

## 2023-06-20 MED ORDER — SODIUM CHLORIDE 0.9 % IV SOLN
INTRAVENOUS | Status: AC | PRN
  Administered 2023-06-20: 75 mL/h via INTRAVENOUS

## 2023-06-20 MED ORDER — FENTANYL CITRATE (PF) 100 MCG/2ML IJ SOLN
INTRAMUSCULAR | Status: AC | PRN
Start: 2023-06-20 — End: 2023-06-20
  Administered 2023-06-20 (×2): 25 ug via INTRAVENOUS

## 2023-06-20 MED ORDER — NITROGLYCERIN 1 MG/10 ML FOR IR/CATH LAB
2000.0000 ug | Freq: Once | INTRA_ARTERIAL | Status: AC
  Administered 2023-06-20: 2000 ug via INTRA_ARTERIAL

## 2023-06-20 MED ORDER — HYDROCODONE-ACETAMINOPHEN 5-325 MG PO TABS
1.0000 | ORAL_TABLET | Freq: Once | ORAL | Status: AC
  Administered 2023-06-20: 1 via ORAL
  Filled 2023-06-20: qty 1

## 2023-06-20 MED ORDER — HEPARIN SODIUM (PORCINE) 1000 UNIT/ML IJ SOLN
INTRAMUSCULAR | Status: AC
  Filled 2023-06-20: qty 10

## 2023-06-20 MED ORDER — NITROGLYCERIN 1 MG/10 ML FOR IR/CATH LAB
INTRA_ARTERIAL | Status: AC
  Filled 2023-06-20: qty 10

## 2023-06-20 MED ORDER — FENTANYL CITRATE (PF) 100 MCG/2ML IJ SOLN
INTRAMUSCULAR | Status: AC
  Filled 2023-06-20: qty 2

## 2023-06-20 MED ORDER — VERAPAMIL HCL 2.5 MG/ML IV SOLN
INTRAVENOUS | Status: AC
  Filled 2023-06-20: qty 2

## 2023-06-20 MED ORDER — MIDAZOLAM HCL 2 MG/2ML IJ SOLN
INTRAMUSCULAR | Status: AC | PRN
Start: 2023-06-20 — End: 2023-06-20
  Administered 2023-06-20 (×2): 1 mg via INTRAVENOUS

## 2023-06-20 MED ORDER — IOHEXOL 300 MG/ML  SOLN
150.0000 mL | Freq: Once | INTRAMUSCULAR | Status: AC | PRN
  Administered 2023-06-20: 100 mL via INTRA_ARTERIAL

## 2023-06-20 MED ORDER — LIDOCAINE HCL 1 % IJ SOLN
INTRAMUSCULAR | Status: AC
  Filled 2023-06-20: qty 20

## 2023-06-20 MED ORDER — IOHEXOL 300 MG/ML  SOLN
100.0000 mL | Freq: Once | INTRAMUSCULAR | Status: AC | PRN
  Administered 2023-06-20: 50 mL via INTRA_ARTERIAL

## 2023-06-20 MED ORDER — SODIUM CHLORIDE 0.9 % IV SOLN: INTRAVENOUS | Status: AC

## 2023-06-20 MED ORDER — VERAPAMIL HCL 2.5 MG/ML IV SOLN: Freq: Once | INTRAVENOUS | Status: DC

## 2023-06-20 MED ORDER — MIDAZOLAM HCL 2 MG/2ML IJ SOLN
INTRAMUSCULAR | Status: AC
  Filled 2023-06-20: qty 2

## 2023-06-20 NOTE — Procedures (Signed)
INR.  Status post bilateral common carotid artery and right vertebral artery angiograms.  Right radial approach and right CFA approach.  Findings.  1.  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  Patient tolerated the procedure well.  No acute complications.  S.Brittanny Levenhagen MD

## 2023-06-20 NOTE — Sedation Documentation (Signed)
Handoff at bedside with receiving RN. Radial and femoral sites assessed. Sites intact x2. No hematoma x2. Pulses verified with receiving RN. Rn denies having any questions. Patient denies s/s of discomfort. None observed. VSS. Patient confirmed he as all belongings, including his glasses. TR syringe handed off to nurse.

## 2023-06-20 NOTE — Progress Notes (Signed)
Called to patient's room due to acute onset diaphoresis and dizziness while laying in bed. Per patient's wife at bedside he experiences similar issues with migraines however usually accompanied by nausea. Headache earlier 4/10 relieved with home Vicodin. Tolerated oral intake earlier without issue. He denies chest pain, dyspnea, new or worsened headache, vision changes, weakness, numbness, tingling, nausea, vomiting, diarrhea, abdominal pain.   Patient originally standing at bedside, states feels dizzy so was seated and subsequently laid down with some improvement in symptoms. Initial SBP 73 during transition. Bed placed in trendelenburg position with relief of dizziness. Repeat SBP 110-130. Given 500 cc bolus NS, BP remained stable and patient without further symptoms. Ambulated in hall with assistance without return of symptoms, orthostatic vital signs obtained and stable.  Patient stable for discharge - return precautions reviewed with patient and wife who state understanding and agreement.  They are aware of follow up with Dr. Corliss Skains next week to discuss imaging and plan of care.  Lynnette Caffey, PA-C
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
## 2023-06-20 NOTE — Discharge Instructions (Signed)

## 2023-06-21 ENCOUNTER — Other Ambulatory Visit (HOSPITAL_COMMUNITY): Payer: Self-pay | Admitting: Interventional Radiology

## 2023-06-21 ENCOUNTER — Ambulatory Visit: Payer: BC Managed Care – PPO | Admitting: Neurology

## 2023-06-21 DIAGNOSIS — H93A9 Pulsatile tinnitus, unspecified ear: Secondary | ICD-10-CM

## 2023-06-27 ENCOUNTER — Ambulatory Visit: Payer: BC Managed Care – PPO | Admitting: Neurology

## 2023-06-27 DIAGNOSIS — G43711 Chronic migraine without aura, intractable, with status migrainosus: Secondary | ICD-10-CM

## 2023-06-27 DIAGNOSIS — G44011 Episodic cluster headache, intractable: Secondary | ICD-10-CM

## 2023-06-27 DIAGNOSIS — G4733 Obstructive sleep apnea (adult) (pediatric): Secondary | ICD-10-CM

## 2023-06-27 DIAGNOSIS — R519 Headache, unspecified: Secondary | ICD-10-CM

## 2023-06-27 DIAGNOSIS — S065XAA Traumatic subdural hemorrhage with loss of consciousness status unknown, initial encounter: Secondary | ICD-10-CM

## 2023-06-28 ENCOUNTER — Ambulatory Visit: Payer: BC Managed Care – PPO | Admitting: Neurology
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
## 2023-06-29 ENCOUNTER — Ambulatory Visit (HOSPITAL_COMMUNITY)
Admission: RE | Admit: 2023-06-29 | Discharge: 2023-06-29 | Disposition: A | Discharge status: Home / Self Care | Payer: BC Managed Care – PPO | Source: Ambulatory Visit | Attending: Interventional Radiology | Admitting: Interventional Radiology

## 2023-06-29 DIAGNOSIS — H93A9 Pulsatile tinnitus, unspecified ear: Secondary | ICD-10-CM

## 2023-07-02 ENCOUNTER — Other Ambulatory Visit: Payer: Self-pay | Admitting: Neurology

## 2023-07-02 DIAGNOSIS — R519 Headache, unspecified: Secondary | ICD-10-CM

## 2023-07-02 DIAGNOSIS — G44011 Episodic cluster headache, intractable: Secondary | ICD-10-CM

## 2023-07-02 DIAGNOSIS — G4733 Obstructive sleep apnea (adult) (pediatric): Secondary | ICD-10-CM

## 2023-07-02 DIAGNOSIS — S065XAA Traumatic subdural hemorrhage with loss of consciousness status unknown, initial encounter: Secondary | ICD-10-CM

## 2023-07-02 DIAGNOSIS — G43711 Chronic migraine without aura, intractable, with status migrainosus: Secondary | ICD-10-CM
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
## 2023-07-03 HISTORY — PX: IR RADIOLOGIST EVAL & MGMT: IMG5224

## 2023-07-13 ENCOUNTER — Ambulatory Visit (HOSPITAL_COMMUNITY)
Admission: RE | Admit: 2023-07-13 | Discharge: 2023-07-13 | Disposition: A | Discharge status: Home / Self Care | Payer: BC Managed Care – PPO | Source: Ambulatory Visit | Attending: Interventional Radiology | Admitting: Interventional Radiology

## 2023-07-13 DIAGNOSIS — H93A9 Pulsatile tinnitus, unspecified ear: Secondary | ICD-10-CM | POA: Diagnosis present

## 2023-08-15 ENCOUNTER — Telehealth (HOSPITAL_COMMUNITY): Payer: Self-pay

## 2023-08-15 NOTE — Telephone Encounter (Signed)
Called regarding recent imaging, no answer, left vm. AB

## 2023-08-16 ENCOUNTER — Telehealth: Payer: Self-pay | Admitting: Neurology

## 2023-08-16 ENCOUNTER — Ambulatory Visit: Payer: BC Managed Care – PPO | Admitting: Neurology

## 2023-08-16 NOTE — Telephone Encounter (Signed)
LVM with pt and pt's wife and sent mychart msg informing pt of cancellation of today's appt with Dr. Lucia Gaskins and asked him to call us back to schedule an appointment with Dr. Vickey Huger

## 2023-08-16 NOTE — Telephone Encounter (Signed)
Pt called to schedule appt with Dt. Dohmeier. Appt scheduled for 10/04/2023

## 2023-08-16 NOTE — Telephone Encounter (Signed)
I called and left a message for patient.  He was diagnosed with severe sleep apnea at the end of October.  I called and left him a message stating the follow-up today with me is likely unneeded because he needs to treat his severe sleep apnea which is likely causing his uncontrolled morning headaches.  He has to follow-up with sleep team, establish himself with a CPAP, and see how his headaches are before he comes back to see me this could very well be the cause of his headaches.  I did leave him a message, will you call him again this morning and let him know that we should probably cancel today's appointment unless he is wants to come in to talk to me about something else I am not a sleep doctor, he should really have follow-up with the sleep team at this point.  However happy to see him he want to talk to me about something else but again for his headaches he needs to treat that sleep apnea and that is what I will tell him today and I hate to charge him an appointment for something he already knows.  Please give them a call again or several because this morning see if you can reach him if not please call his contact.  Thanks

## 2023-08-16 NOTE — Progress Notes (Unsigned)
[facility]    Provider:  Dr Lucia Gaskins Requesting Provider: Shelva Majestic, MD Primary Care Provider:  Rebecka Apley, NP  CC:  migraine  03/20/2023: repeat lupus antioagulant at next appointment  Recent Results (from the past 2160 hour(s))  CBC upon arrival     Status: Abnormal   Collection Time: 06/20/23  6:56 AM  Result Value Ref Range   WBC 7.8 4.0 - 10.5 K/uL   RBC 4.49 4.22 - 5.81 MIL/uL   Hemoglobin 12.9 (L) 13.0 - 17.0 g/dL   HCT 16.1 09.6 - 04.5 %   MCV 87.1 80.0 - 100.0 fL   MCH 28.7 26.0 - 34.0 pg   MCHC 33.0 30.0 - 36.0 g/dL   RDW 40.9 81.1 - 91.4 %   Platelets 212 150 - 400 K/uL   nRBC 0.0 0.0 - 0.2 %    Comment: Performed at [facility], [address], [place], [place] [address]  Protime-INR upon arrival     Status: None   Collection Time: 06/20/23  6:56 AM  Result Value Ref Range   Prothrombin Time 13.8 11.4 - 15.2 seconds   INR 1.0 0.8 - 1.2    Comment: (NOTE) INR goal varies based on device and disease states. Performed at [facility], [address] [place], [place] [address]   Basic metabolic panel     Status: Abnormal   Collection Time: 06/20/23  6:56 AM  Result Value Ref Range   Sodium 142 135 - 145 mmol/L   Potassium 3.5 3.5 - 5.1 mmol/L    Comment: HEMOLYSIS AT THIS LEVEL MAY AFFECT RESULT   Chloride 102 98 - 111 mmol/L   CO2 29 22 - 32 mmol/L   Glucose, Bld 111 (H) 70 - 99 mg/dL    Comment: Glucose reference range applies only to samples taken after fasting for at least 8 hours.   BUN 14 6 - 20 mg/dL   Creatinine, Ser 8.65 0.61 - 1.24 mg/dL   Calcium 9.5 8.9 - 78.4 mg/dL   GFR, Estimated >69 >62 mL/min    Comment: (NOTE) Calculated using the CKD-EPI Creatinine Equation (2021)    Anion gap 11 5 - 15    Comment: Performed at [facility], [address], [place], [place] [address]     HPI:  Steven Holland is a 52 y.o. male here as requested by Shelva Majestic, MD for intractable  chronic migraine.has Hypogonadism male; ED (erectile dysfunction); HTN (hypertension); Migraine headache with aura; AR (allergic rhinitis); GERD (gastroesophageal reflux disease); Irritable bowel syndrome (IBS); Obesity, Class I, BMI 30-34.9; Hyperlipidemia; Hyperglycemia; Emphysematous gastritis; Tonsillith; Anxiety state; Nonallopathic lesion of thoracic region; Migraine without aura and without status migrainosus, not intractable; Chronic migraine without aura, with intractable migraine, so stated, with status migrainosus; SDH (subdural hematoma) (HCC); Intractable episodic cluster headache; and Uncontrolled morning headache on their problem list.  Also chronic tinnitus.  Recent subdural hematoma seen by neurology last in April 2023.  Just started Ajovy at that time.  I reviewed Dr. Erasmo Leventhal notes patient has intractable chronic migraines which have been ongoing, he already saw neurology but missed last appointment due to a migraine that morning, migraines occur barometric pressure, he has had headaches upwards of 4 days in a row, he saw Dr. Everlena Cooper at [facility] neurology he takes hydrocodone 20 tabs per month for severe pain 7 out of 10, it appears they check CBC, CMP, testosterone and A1c.  Per Dr. Moises Blood  notes last seen September 2023, he was diagnosed with chronic migraine without aura without status migrainosus not intractable.  Also occipital headaches/occipital neuralgia or cervicogenic with a history of neck trauma.  Dr. Everlena Cooper started him on Vyepti 100 mg every 3 months.  Given Fioricet with codeine as rescue.  He also just saw [facility] for SDH and neurologistDr. Everlena Cooper.  He saw them in April 2024 secondary to a subdural hematoma identified on his head CT.  The week prior the headache was left-sided and unrelenting taking Benadryl to help him sleep also taking Vicodin, Toradol, steroid pack, after that CT was performed and identified a subdural hematoma, no recent head injury, excessive NSAID use or  aspirin, he was recently started on Ajovy for headache prevention but was waiting for authorization.  Patient is here and reports his migraines started in his early 5s and can be the back of the head or bitemporal, or on the right side, pounding throbbing pulsating, can be moderate to severe usually 5-6 out of 10 but rarely higher but can be very bad, he denies any auras or any post rooms or post rooms, he has nausea, photo and phonophobia, can last all day 24 to 72 hours but with pain relief he can terminate it within 2 to 4 hours.  Sometimes he does wake up with headaches.  He has headaches 4 to 5 days a week and changes of barometric pressure is a trigger.  The pain medication does help.  He has tried an extensive list of medications.he recently had a SDH on the left fronto-parietal after 8 day severe head pain though it was a migraine. Family hx paternal grandfather dieing of an aneurysm. No falls, no trauma, no exercise, nothing out of the ordinary. Wakes up with headaches, snoring, fatigue, doesn't feel refreshed waking up. Chronic neck pain, been under care of pcp, neuro, chiropractor > 5 years, worsening, cervicogenic headaches, radiculopathy left arm shooting pain. No other focal neurologic deficits, associated symptoms, inciting events or modifiable factors.   Reviewed notes, labs and imaging from outside physicians, which showed:was able to   CT head 12/06/2022: IMPRESSION: Relatively thin mixed attenuation subdural hemorrhage along the left cerebral hemisphere, predominantly overlying the left frontal and parietal lobes, probably subacute. It measures up to approximately 7 mm in greatest thickness along the anterolateral left frontal lobe. Mild mass effect.   CT head 02/08/2023:  IMPRESSION: Interval resolution of the prior left subdural hemorrhage.  Electronically Signed by: Italy Holder, MD on 02/09/2023 9:51 AM   01/23/2023: hgba1c 6.2,  Cmp with alt 57, glucose 129 otherwise normal Cbc  normal   From a thorough review of records, medications that he is tried and failed or had side effects to for at least 2 months include: Baclofen, buspirone, Fioricet, magnesium, nortriptyline, Zofran, beta-blockers, topiramate, nortriptyline, Emgality, Aimovig and Botox.  Vyepti was prescribed unclear if it was ever started  Dr. Adriana Mccallum notes: Frequency of abortive medication: Fioricet with codeine 2-3 days a week Rescue protocol:  Bernita Raisin, Fioricet with codeine Current NSAIDS:  none Current analgesics:  Fioricet with codeine Current triptans:  none Current ergotamine: None Current anti-emetic: Zofran 8 mg Current muscle relaxants: None Current anti-anxiolytic: buspirone Current sleep aide: None Current Antihypertensive medications: Hydrochlorothiazide Current Antidepressant medications: Nortriptyline 100 mg at bedtime Current Anticonvulsant medications: None Current anti-CGRP:  Ubrelvy 100mg  (not helpful) Current Vitamins/Herbal/Supplements: B12, MVI, Mg 250mg  daily, folic acid Current Antihistamines/Decongestants: None Hormones:  testosterone Other therapy:  OMM for chronic neck pain  Past NSAIDS:  ibuprofen, Sprix NS (effective 50% of time but burns), Elyxyb Past analgesics:  Tylenol, vicodin (worked) Past abortive triptans:  Sumatriptan tablet, Maxalt, Relpax, Zembrace (all ineffective) Past ergots:  Migranal Past muscle relaxants:  no Past anti-emetic:  no Past antihypertensive medications:  bisoprolol Past antidepressant medications:  no Past anticonvulsant medications:  topiramate 100mg  (paresthesias, impotence) Past CGRP inhibitor:  Aimovig 140mg , Nurtec, Emgality, ajovy  Past vitamins/Herbal/Supplements:  no Other past therapies:  Botox (ineffective), [name]  CT head 12/2020: CLINICAL DATA:  Constant pulsatile tinnitus since 2017.   EXAM: CT ANGIOGRAPHY HEAD AND NECK   TECHNIQUE: Multidetector CT imaging of the head and neck was performed using the standard  protocol during bolus administration of intravenous contrast. Multiplanar CT image reconstructions and MIPs were obtained to evaluate the vascular anatomy. Carotid stenosis measurements (when applicable) are obtained utilizing NASCET criteria, using the distal internal carotid diameter as the denominator.   CONTRAST:  75mL ISOVUE-370 IOPAMIDOL (ISOVUE-370) INJECTION 76%   COMPARISON:  None.   FINDINGS: CT HEAD FINDINGS   Brain: No evidence of acute infarction, hemorrhage, hydrocephalus, extra-axial collection or mass lesion/mass effect.   Vascular: See below   Skull: Normal. Negative for fracture or focal lesion.   Sinuses: Clear   Orbits: Negative   Review of the MIP images confirms the above findings   CTA NECK FINDINGS   Aortic arch: Normal with 3 vessel branching.   Right carotid system: Mild atheromatous plaque at the bifurcation. No stenosis or ulceration.   Left carotid system: Vessels are smooth and widely patent   Vertebral arteries: No proximal subclavian stenosis. Both vertebral arteries are smooth and widely patent to the dura.   Skeleton: Negative   Other neck: Subcentimeter thyroid nodules measuring up to 6 mm on the right. No followup recommended (ref: J Am Coll Radiol. 2015 Feb;12(2): 143-50).   Upper chest: Negative   Review of the MIP images confirms the above findings   CTA HEAD FINDINGS   Anterior circulation: Vessels are smooth and widely patent. Negative for aneurysm or visible vascular malformation.   Posterior circulation: Vessels are smooth and widely patent. Negative for aneurysm or visible vascular malformation.   Venous sinuses: Inadvertent preferential venous timing. The left transverse sigmoid sinus is non dominant and the transverse sinus essentially begins at the draining vein of Labbe where there is likely also an arachnoid granulation.   Anatomic variants: None significant.   Review of the MIP images confirms the above  findings   IMPRESSION: 1. No explanation symptoms. 2. Early atherosclerosis.     Electronically Signed   By: Marnee Spring M.D.   On: 12/25/2020 07:26  CT neck 12/2020: Narrative & Impression  CLINICAL DATA:  Constant pulsatile tinnitus since 2017.   EXAM: CT ANGIOGRAPHY HEAD AND NECK   TECHNIQUE: Multidetector CT imaging of the head and neck was performed using the standard protocol during bolus administration of intravenous contrast. Multiplanar CT image reconstructions and MIPs were obtained to evaluate the vascular anatomy. Carotid stenosis measurements (when applicable) are obtained utilizing NASCET criteria, using the distal internal carotid diameter as the denominator.   CONTRAST:  75mL ISOVUE-370 IOPAMIDOL (ISOVUE-370) INJECTION 76%   COMPARISON:  None.   FINDINGS: CT HEAD FINDINGS   Brain: No evidence of acute infarction, hemorrhage, hydrocephalus, extra-axial collection or mass lesion/mass effect.   Vascular: See below   Skull: Normal. Negative for fracture or focal lesion.   Sinuses: Clear   Orbits: Negative   Review of the MIP images confirms the  above findings   CTA NECK FINDINGS   Aortic arch: Normal with 3 vessel branching.   Right carotid system: Mild atheromatous plaque at the bifurcation. No stenosis or ulceration.   Left carotid system: Vessels are smooth and widely patent   Vertebral arteries: No proximal subclavian stenosis. Both vertebral arteries are smooth and widely patent to the dura.   Skeleton: Negative   Other neck: Subcentimeter thyroid nodules measuring up to 6 mm on the right. No followup recommended (ref: J Am Coll Radiol. 2015 Feb;12(2): 143-50).   Upper chest: Negative   Review of the MIP images confirms the above findings   CTA HEAD FINDINGS   Anterior circulation: Vessels are smooth and widely patent. Negative for aneurysm or visible vascular malformation.   Posterior circulation: Vessels are smooth and  widely patent. Negative for aneurysm or visible vascular malformation.   Venous sinuses: Inadvertent preferential venous timing. The left transverse sigmoid sinus is non dominant and the transverse sinus essentially begins at the draining vein of Labbe where there is likely also an arachnoid granulation.   Anatomic variants: None significant.   Review of the MIP images confirms the above findings   IMPRESSION: 1. No explanation symptoms. 2. Early atherosclerosis.    Review of Systems: Patient complains of symptoms per HPI as well as the following symptoms chronic intractable headaches. Pertinent negatives and positives per HPI. All others negative.   Social History   Socioeconomic History   Marital status: Married    Spouse name: Not on file   Number of children: 0   Years of education: 4   Highest education level: Not on file  Occupational History   Occupation: Futures trader  Tobacco Use   Smoking status: Never   Smokeless tobacco: Never  Vaping Use   Vaping status: Never Used  Substance and Sexual Activity   Alcohol use: Yes    Comment: occasional liquor/wine   Drug use: No   Sexual activity: Yes  Other Topics Concern   Not on file  Social History Narrative   Married wife [name]. No children.       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 Very busy at work. Enjoys Psychologist, educational. Household projects and Holiday representative.       Caffeine 1-2 sodas day/ exercise a little   One story home   Right handed   Social Determinants of Health   Financial Resource Strain: Low Risk  (05/18/2023)   Received from [facility]   Overall Financial Resource Strain (CARDIA)    Difficulty of Paying Living Expenses: Not hard at all  Food Insecurity: No Food Insecurity (05/18/2023)   [facility] from [facility]   Hunger Vital Sign    Worried About Running Out of Food in the Last Year: Never true    Ran Out of Food in the Last Year: Never  true  Transportation Needs: No Transportation Needs (05/18/2023)   Received from [facility] - Transportation    Lack of Transportation (Medical): No    Lack of Transportation (Non-Medical): No  Physical Activity: Sufficiently Active (05/18/2023)   Received from [facility]   Exercise Vital Sign    Days of Exercise per Week: 5 days    Minutes of Exercise per Session: 30 min  Stress: Stress Concern Present (05/18/2023)   Received from [facility] - Occupational Stress Questionnaire    Feeling of Stress : To  some extent  Social Connections: Moderately Integrated (05/18/2023)   Received from [facility]   Social Network    How would you rate your social network (family, work, friends)?: Adequate participation with social networks  Intimate Partner Violence: Not At Risk (05/18/2023)   Received from [facility]   HITS    Over the last 12 months how often did your partner physically hurt you?: Never    Over the last 12 months how often did your partner insult you or talk down to you?: Never    Over the last 12 months how often did your partner threaten you with physical harm?: Never    Over the last 12 months how often did your partner scream or curse at you?: Never    Family History  Problem Relation Age of Onset   Migraines Mother    Arthritis Mother        knee replacements and back   Heart disease Father        30s reportedly- patient no issues   Benign prostatic hyperplasia Father    Other Brother        hypogonadism/low T both brothers   Migraines Brother        bothbrothers   Prostate cancer Maternal Grandfather    Diabetes Paternal Grandmother    Colon cancer Neg Hx    Rectal cancer Neg Hx    Stomach cancer Neg Hx    Esophageal cancer Neg Hx     Past Medical History:  Diagnosis Date   Allergy    Anxiety    Brain bleed (HCC)    11-2022  [facility]   Chicken pox    Chronic headaches    Depression    GERD  (gastroesophageal reflux disease)    Hyperlipidemia    Hypertension    IBS (irritable bowel syndrome)    Low testosterone    Obesity    Syncope    related to IBS and severe nausea, no current problems    Patient Active Problem List   Diagnosis Date Noted   Chronic migraine without aura, with intractable migraine, so stated, with status migrainosus 04/03/2023   SDH (subdural hematoma) (HCC) 04/03/2023   Intractable episodic cluster headache 04/03/2023   Uncontrolled morning headache 04/03/2023   Migraine without aura and without status migrainosus, not intractable 05/17/2022   Nonallopathic lesion of thoracic region 12/01/2020   Anxiety state 12/31/2019   Tonsillith 05/06/2019   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   Hyperglycemia 05/09/2017   Hyperlipidemia 11/04/2016   Obesity, Class I, BMI 30-34.9 10/22/2015   Hypogonadism male 12/18/2011   ED (erectile dysfunction) 12/18/2011   HTN (hypertension) 12/18/2011   Migraine headache with aura 12/18/2011   AR (allergic rhinitis) 12/18/2011   GERD (gastroesophageal reflux disease) 12/18/2011   Irritable bowel syndrome (IBS) 12/18/2011    Past Surgical History:  Procedure Laterality Date   EYE SURGERY Right 06/23/2020   Retina surgery   IR ANGIO INTRA EXTRACRAN SEL COM CAROTID INNOMINATE BILAT MOD SED  06/20/2023   IR ANGIO VERTEBRAL SEL VERTEBRAL UNI R MOD SED  06/20/2023   IR RADIOLOGIST EVAL & MGMT  03/17/2023   IR RADIOLOGIST EVAL & MGMT  07/03/2023   IR US GUIDE VASC ACCESS RIGHT  06/20/2023   NO PAST SURGERIES      Current Outpatient Medications  Medication Sig Dispense Refill   AJOVY 225 MG/1.5ML SOAJ      ALPRAZolam (XANAX) 0.25 MG tablet Take 1-2 tabs (0.25mg -0.50mg ) 30-60 minutes before procedure. May  repeat if needed.Do not drive. 4 tablet 0   atropine-phenobarbital-scopolamine-hyoscyamine (DONNATAL) 16.2 MG TABS Take 1 tablet by mouth as needed.     baclofen (LIORESAL) 10 MG tablet TAKE 1 TABLET BY MOUTH THREE  TIMES A DAY AS NEEDED FOR MUSCLE SPASM 270 tablet 0   busPIRone (BUSPAR) 7.5 MG tablet Take 1 tablet (7.5 mg total) by mouth 2 (two) times daily as needed. 180 tablet 3   co-enzyme Q-10 30 MG capsule Take 30 mg by mouth 3 (three) times daily.     folic acid (FOLVITE) 400 MCG tablet Take 400 mcg by mouth daily.     hydrochlorothiazide (HYDRODIURIL) 25 MG tablet Take 1 tablet (25 mg total) by mouth daily. 90 tablet 3   HYDROcodone-acetaminophen (VICODIN) 5-325 mg TABS tablet      Magnesium 250 MG TABS Take 250 mg by mouth daily.      Multiple Vitamin (MULITIVITAMIN WITH MINERALS) TABS Take 1 tablet by mouth daily.     omeprazole (PRILOSEC) 20 MG capsule Take 1 capsule (20 mg total) by mouth daily. 90 capsule 3   ondansetron (ZOFRAN) 8 MG tablet TAKE 1 TABLET BY MOUTH EVERY 8 HOURS AS NEEDED 9 tablet 11   POTASSIUM CITRATE ER PO Take 400 mg by mouth daily.     Tadalafil 2.5 MG TABS TAKE 1 TABLET BY MOUTH EVERY DAY 90 tablet 3   Testosterone 20.25 MG/ACT (1.62%) GEL APPLY 4 PUMPS TOPICALLY DAILY 450 g 5   vitamin B-12 (CYANOCOBALAMIN) 1000 MCG tablet Take 1,000 mcg by mouth daily.     No current facility-administered medications for this visit.    Allergies as of 08/16/2023 - Review Complete 06/20/2023  Allergen Reaction Noted   Sulfa antibiotics Swelling 11/09/2011   Milk-related compounds Other (See Comments) 05/15/2018   Nickel Rash 08/23/2019    Vitals: There were no vitals taken for this visit. Last Weight:  Wt Readings from Last 1 Encounters:  06/20/23 215 lb (97.5 kg)   Last Height:   Ht Readings from Last 1 Encounters:  06/20/23 5\' 8"  (1.727 m)     Physical exam: Exam: Gen: NAD, conversant, well nourised, obese, well groomed                     CV: RRR, no MRG. No Carotid Bruits. No peripheral edema, warm, nontender Eyes: Conjunctivae clear without exudates or hemorrhage  Neuro: Detailed Neurologic Exam  Speech:    Speech is normal; fluent and spontaneous with  normal comprehension.  Cognition:    The patient is oriented to person, place, and time;     recent and remote memory intact;     language fluent;     normal attention, concentration,     fund of knowledge Cranial Nerves:    The pupils are equal, round, and reactive to light. The fundi are normal and spontaneous venous pulsations are present. Visual fields are full to finger confrontation. Extraocular movements are intact. Trigeminal sensation is intact and the muscles of mastication are normal. The face is symmetric. The palate elevates in the midline. Hearing intact. Voice is normal. Shoulder shrug is normal. The tongue has normal motion without fasciculations.   Coordination:    Normal finger to nose and heel to shin. Normal rapid alternating movements.   Gait:    Heel-toe and tandem gait are normal.   Motor Observation:    No asymmetry, no atrophy, and no involuntary movements noted. Tone:    Normal muscle tone.  Posture:    Posture is normal. normal erect    Strength:mild left triceps weakness. Strength is V/V in the upper and lower limbs.      Sensation: intact to LT     Reflex Exam:  DTR's:    Deep tendon reflexes in the upper and lower extremities are normal bilaterally.   Toes:    The toes are downgoing bilaterally.   Clonus: 2 beats clonus    Assessment/Plan:  Patient with chronic intractable headaches and now an unprovoked SDH.  Cerebral hemorrhage: unprovoked MRI brain w/wo contrast Bloodwork for clotting disorders Cerebral Angiogram  If nothing found, consider sending to hematology  Chronic Migraines:   Patient is on Fioricet and oxycodone 2 medications that are well-known to cause medication rebound I would advise Dr. Durene Cal possibly to decrease to lowest level if not discontinue if possible. But he went off for 2 months without any changes. Rebound headaches are also controversial.   Wakes up with headaches, snoring, fatigue, doesn't feel refreshed  waking up. Sleep evaluation   MRI brain w/wo contrast: abormal white matter changes, SDH seen on CT, stroke and MS protocol   Chronic neck pain, been under care of pcp, neuro, chiropractor > 5 years, worsening, cervicogenic headaches, radiculopathy left arm shooting pain, ataxia check for myelopathy  No orders of the defined types were placed in this encounter.  No orders of the defined types were placed in this encounter.   Cc: Shelva Majestic, MD,  Ihor Austin Ruby Cola, NP  Naomie Dean, MD  [facility] [address] [place], [place] [address]  Phone [phone] Fax [phone]  I spent over 90 minutes of face-to-face and non-face-to-face time with patient on the  No diagnosis found.  diagnosis.  This included previsit chart review, lab review, study review, order entry, electronic health record documentation, patient education on the different diagnostic and therapeutic options, counseling and coordination of care, risks and benefits of management, compliance, or risk factor reduction

## 2023-08-22 ENCOUNTER — Telehealth (HOSPITAL_COMMUNITY): Payer: Self-pay

## 2023-08-22 NOTE — Telephone Encounter (Deleted)
Correction: Pt agreed to f/u in 1 year with a CT temporal bone not CTA head/neck. AB

## 2023-08-22 NOTE — Telephone Encounter (Addendum)
Pt agreed to f/u in 1 year with a  CT temporal bone. AB

## 2023-10-04 ENCOUNTER — Ambulatory Visit (INDEPENDENT_AMBULATORY_CARE_PROVIDER_SITE_OTHER): Payer: BC Managed Care – PPO | Admitting: Neurology

## 2023-10-04 ENCOUNTER — Encounter: Payer: Self-pay | Admitting: Neurology

## 2023-10-04 VITALS — BP 155/97 | HR 76 | Ht 68.0 in | Wt 196.0 lb

## 2023-10-04 DIAGNOSIS — S065XAA Traumatic subdural hemorrhage with loss of consciousness status unknown, initial encounter: Secondary | ICD-10-CM

## 2023-10-04 DIAGNOSIS — G44011 Episodic cluster headache, intractable: Secondary | ICD-10-CM | POA: Diagnosis not present

## 2023-10-04 DIAGNOSIS — G4733 Obstructive sleep apnea (adult) (pediatric): Secondary | ICD-10-CM

## 2023-10-04 DIAGNOSIS — R519 Headache, unspecified: Secondary | ICD-10-CM

## 2023-10-04 NOTE — Progress Notes (Signed)
Provider:  Melvyn Novas, MD  Primary Care Physician:  Rebecka Apley, NP [address] [place] [address]     Referring Provider:  Dr Lucia Gaskins        Chief Complaint according to patient   Patient presents with:     New Patient (Initial Visit)           HISTORY OF PRESENT ILLNESS:  Steven Holland is a 53 y.o. male patient who is here for revisit 10/04/2023 for  follow up on his Sleep study HST 06-2023.Marland Kitchen  Chief concern according to patient :   morning headaches, more frequent and more intense since I lost December 6th, my closest friend passed dec  2024.   He is frustrated, depressed and anxious.   Hx of subdural hematoma in February 2024.  The patient endorsed no excessive form of sleepiness his Epworth Sleepiness Scale was endorsed at 2 points and his fatigue severity score endorsed at 42 out of 63 points.  I explained the findings of the home sleep test from 06-28-2023 as referred to by Dr. Daisy Blossom.  He had severe sleep apnea this was obstructive sleep apnea only with an AHI of 32/h not a huge REM sleep exacerbation versus non-REM sleep but very much positional dependent and none supine sleep, the AHI was 7.4/h and in supine sleep the AHI was 50.6/h.  The nadir of oxygen saturation was clinically irrelevant at 88% and there was not even 1 minute of total desaturation time.  Heart rate varied within the normal range between 58 and 114 bpm.  So I felt that this patient would benefit from avoiding supine sleep and starting a CPAP.  I would be hesitant to recommend alternative to CPAP such as dental devices or inspire but a CPAP is reversible and has a potential to improve his headache disorder especially the morning headache component.  If CPAP cannot be tolerated for any reason I would rather recommend a dental device for partial treatment then for the patient to remain untreated.  And again I would like for him to try to lose some weight which will  certainly help the overall snoring volume and apnea frequency.      Dr Lucia Gaskins : [name] is a 53 y.o. male here as requested by Rebecka Apley, NP for intractable chronic migraine.has Hypogonadism male; ED (erectile dysfunction); HTN (hypertension); Migraine headache with aura; AR (allergic rhinitis); GERD (gastroesophageal reflux disease); Irritable bowel syndrome (IBS); Obesity, Class I, BMI 30-34.9; Hyperlipidemia; Hyperglycemia; Emphysematous gastritis; Tonsillith; Anxiety state; Nonallopathic lesion of thoracic region; and Migraine without aura and without status migrainosus, not intractable on their problem list.  Also chronic tinnitus.  Recent subdural hematoma seen by neurology last in April 2023.  Just started Ajovy at that time.   I reviewed Dr. Erasmo Leventhal notes patient has intractable chronic migraines which have been ongoing, he already saw neurology but missed last appointment due to a migraine that morning, migraines occur barometric pressure, he has had headaches upwards of 4 days in a row, he saw Dr. Everlena Cooper at [facility] neurology he takes hydrocodone 20 tabs per month for severe pain 7 out of 10, it appears they check CBC, CMP, testosterone and A1c.  Per Dr. Moises Blood notes last seen September 2023, he was diagnosed with chronic migraine without aura without status migrainosus not intractable.  Also occipital headaches/occipital neuralgia or cervicogenic with a history of neck trauma.  Dr. Everlena Cooper  started him on Vyepti 100 mg every 3 months.  Given Fioricet with codeine as rescue.   He also just saw [facility] for SDH and neurologistDr. Everlena Cooper.  He saw them in April 2024 secondary to a subdural hematoma identified on his head CT.  The week prior the headache was left-sided and unrelenting taking Benadryl to help him sleep also taking Vicodin, Toradol, steroid pack, after that CT was performed and identified a subdural hematoma, no recent head injury, excessive NSAID use or aspirin, he was  recently started on Ajovy for headache prevention but was waiting for authorization.   Patient is here and reports his migraines started in his early 11s and can be the back of the head or bitemporal, or on the right side, pounding throbbing pulsating, can be moderate to severe usually 5-6 out of 10 but rarely higher but can be very bad, he denies any auras or any post rooms or post rooms, he has nausea, photo and phonophobia, can last all day 24 to 72 hours but with pain relief he can terminate it within 2 to 4 hours.  Sometimes he does wake up with headaches.  He has headaches 4 to 5 days a week and changes of barometric pressure is a trigger.  The pain medication does help.  He has tried an extensive list of medications.he recently had a SDH on the left fronto-parietal after 8 day severe head pain though it was a migraine. Family hx paternal grandfather dieing of an aneurysm. No falls, no trauma, no exercise, nothing out of the ordinary. Wakes up with headaches, snoring, fatigue, doesn't feel refreshed waking up. Chronic neck pain, been under care of pcp, neuro, chiropractor > 5 years, worsening, cervicogenic headaches, radiculopathy left arm shooting pain. No other focal neurologic deficits, associated symptoms, inciting events or modifiable factors.         Review of Systems: Out of a complete 14 system review, the patient complains of only the following symptoms, and all other reviewed systems are negative.:     How likely are you to doze in the following situations: 0 = not likely, 1 = slight chance, 2 = moderate chance, 3 = high chance   Sitting and Reading? Watching Television? Sitting inactive in a public place (theater or meeting)? As a passenger in a car for an hour without a break? Lying down in the afternoon when circumstances permit? Sitting and talking to someone? Sitting quietly after lunch without alcohol? In a car, while stopped for a few minutes in traffic?   Total = 2/ 24  points   FSS endorsed at 43/ 63 points.   Social History   Socioeconomic History   Marital status: Married    Spouse name: Not on file   Number of children: 0   Years of education: 4   Highest education level: Not on file  Occupational History   Occupation: Futures trader  Tobacco Use   Smoking status: Never   Smokeless tobacco: Never  Vaping Use   Vaping status: Never Used  Substance and Sexual Activity   Alcohol use: Yes    Comment: occasional liquor/wine   Drug use: No   Sexual activity: Yes  Other Topics Concern   Not on file  Social History Narrative   Married wife [name]. No children.       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 Very busy at work. Enjoys Psychologist, educational. Household projects and Holiday representative.  Caffeine 1-2 sodas day/ exercise a little   One story home   Right handed   Social Drivers of Health   Financial Resource Strain: Low Risk  (05/18/2023)   Received from [facility]   Overall Financial Resource Strain (CARDIA)    Difficulty of Paying Living Expenses: Not hard at all  Food Insecurity: No Food Insecurity (05/18/2023)   Received from [facility]   Hunger Vital Sign    Worried About Running Out of Food in the Last Year: Never true    Ran Out of Food in the Last Year: Never true  Transportation Needs: No Transportation Needs (05/18/2023)   Received from [facility] - Transportation    Lack of Transportation (Medical): No    Lack of Transportation (Non-Medical): No  Physical Activity: Sufficiently Active (05/18/2023)   Received from [facility]   Exercise Vital Sign    Days of Exercise per Week: 5 days    Minutes of Exercise per Session: 30 min  Stress: Stress Concern Present (05/18/2023)   Received from [facility] of Occupational [facility] - Occupational Stress Questionnaire    Feeling of Stress : To some extent  Social Connections: Moderately Integrated  (05/18/2023)   Received from [facility]   Social Network    How would you rate your social network (family, work, friends)?: Adequate participation with social networks    Family History  Problem Relation Age of Onset   Migraines Mother    Arthritis Mother        knee replacements and back   Heart disease Father        30s reportedly- patient no issues   Benign prostatic hyperplasia Father    Other Brother        hypogonadism/low T both brothers   Migraines Brother        bothbrothers   Prostate cancer Maternal Grandfather    Diabetes Paternal Grandmother    Colon cancer Neg Hx    Rectal cancer Neg Hx    Stomach cancer Neg Hx    Esophageal cancer Neg Hx     Past Medical History:  Diagnosis Date   Allergy    Anxiety    Brain bleed (HCC)    11-2022  [facility]   Chicken pox    Chronic headaches    Depression    GERD (gastroesophageal reflux disease)    Hyperlipidemia    Hypertension    IBS (irritable bowel syndrome)    Low testosterone    Obesity    Syncope    related to IBS and severe nausea, no current problems    Past Surgical History:  Procedure Laterality Date   EYE SURGERY Right 06/23/2020   Retina surgery   IR ANGIO INTRA EXTRACRAN SEL COM CAROTID INNOMINATE BILAT MOD SED  06/20/2023   IR ANGIO VERTEBRAL SEL VERTEBRAL UNI R MOD SED  06/20/2023   IR RADIOLOGIST EVAL & MGMT  03/17/2023   IR RADIOLOGIST EVAL & MGMT  07/03/2023   IR US GUIDE VASC ACCESS RIGHT  06/20/2023   NO PAST SURGERIES       Current Outpatient Medications on File Prior to Visit  Medication Sig Dispense Refill   AJOVY 225 MG/1.5ML SOAJ      atropine-phenobarbital-scopolamine-hyoscyamine (DONNATAL) 16.2 MG TABS Take 1 tablet by mouth as needed.     baclofen (LIORESAL) 10 MG tablet TAKE 1 TABLET BY MOUTH THREE TIMES A DAY AS NEEDED FOR MUSCLE SPASM 270 tablet  0   busPIRone (BUSPAR) 7.5 MG tablet Take 1 tablet (7.5 mg total) by mouth 2 (two) times daily as needed. 180 tablet 3   co-enzyme  Q-10 30 MG capsule Take 30 mg by mouth 3 (three) times daily.     folic acid (FOLVITE) 400 MCG tablet Take 400 mcg by mouth daily.     hydrochlorothiazide (HYDRODIURIL) 25 MG tablet Take 1 tablet (25 mg total) by mouth daily. 90 tablet 3   HYDROcodone-acetaminophen (VICODIN) 5-325 mg TABS tablet      Magnesium 250 MG TABS Take 250 mg by mouth daily.      Multiple Vitamin (MULITIVITAMIN WITH MINERALS) TABS Take 1 tablet by mouth daily.     omeprazole (PRILOSEC) 20 MG capsule Take 1 capsule (20 mg total) by mouth daily. 90 capsule 3   ondansetron (ZOFRAN) 8 MG tablet TAKE 1 TABLET BY MOUTH EVERY 8 HOURS AS NEEDED 9 tablet 11   POTASSIUM CITRATE ER PO Take 400 mg by mouth daily.     Tadalafil 2.5 MG TABS TAKE 1 TABLET BY MOUTH EVERY DAY 90 tablet 3   Testosterone 20.25 MG/ACT (1.62%) GEL APPLY 4 PUMPS TOPICALLY DAILY 450 g 5   vitamin B-12 (CYANOCOBALAMIN) 1000 MCG tablet Take 1,000 mcg by mouth daily.     No current facility-administered medications on file prior to visit.    Allergies  Allergen Reactions   Sulfa Antibiotics Swelling    Eyes became swollen and ear became swollen (localized)   Milk-Related Compounds Other (See Comments)    Intense abdominal pain and passing out if he consumes over 4 ounces of milk , pt can have ice cream , only happens with plain milk, can have it in cakes, etc.   Nickel Rash     DIAGNOSTIC DATA (LABS, IMAGING, TESTING) - I reviewed patient records, labs, notes, testing and imaging myself where available.  Lab Results  Component Value Date   WBC 7.8 06/20/2023   HGB 12.9 (L) 06/20/2023   HCT 39.1 06/20/2023   MCV 87.1 06/20/2023   PLT 212 06/20/2023      Component Value Date/Time   NA 142 06/20/2023 0656   NA 144 02/22/2023 1147   K 3.5 06/20/2023 0656   CL 102 06/20/2023 0656   CO2 29 06/20/2023 0656   GLUCOSE 111 (H) 06/20/2023 0656   BUN 14 06/20/2023 0656   BUN 11 02/22/2023 1147   CREATININE 1.01 06/20/2023 0656   CREATININE 0.88  06/26/2020 1006   CALCIUM 9.5 06/20/2023 0656   PROT 7.0 02/22/2023 1147   ALBUMIN 4.5 02/22/2023 1147   AST 37 02/22/2023 1147   ALT 55 (H) 02/22/2023 1147   ALKPHOS 92 02/22/2023 1147   BILITOT 0.3 02/22/2023 1147   GFRNONAA >60 06/20/2023 0656   GFRNONAA 89 07/05/2016 1618   GFRAA >60 05/16/2018 0357   GFRAA >89 07/05/2016 1618   Lab Results  Component Value Date   CHOL 208 (H) 08/17/2021   HDL 47.70 08/17/2021   LDLCALC 141 (H) 06/26/2020   LDLDIRECT 136.0 08/17/2021   TRIG 259.0 (H) 08/17/2021   CHOLHDL 4 08/17/2021   Lab Results  Component Value Date   HGBA1C 6.0 08/17/2021   Lab Results  Component Value Date   VITAMINB12 >1500 (H) 05/06/2019   Lab Results  Component Value Date   TSH 1.230 02/22/2023    PHYSICAL EXAM:  Today's Vitals   10/04/23 1457  BP: (!) 155/97  Pulse: 76  Weight: 196 lb (88.9 kg)  Height: 5\' 8"  (1.727 m)   Body mass index is 29.8 kg/m.   Wt Readings from Last 3 Encounters:  10/04/23 196 lb (88.9 kg)  06/20/23 215 lb (97.5 kg)  04/03/23 214 lb 12.8 oz (97.4 kg)     Ht Readings from Last 3 Encounters:  10/04/23 5\' 8"  (1.727 m)  06/20/23 5\' 8"  (1.727 m)  04/03/23 5\' 8"  (1.727 m)      General: The patient is awake, alert and appears not in acute distress. The patient is well groomed. Head: Normocephalic, atraumatic. Neck is suppleThe patient is awake, alert and appears not in acute distress. The patient is well groomed. Head: Normocephalic, atraumatic. Neck is supple.  Mallampati 1,  neck circumference:17 inches . Nasal airflow  patent.  Retrognathia is not seen. Facial hair.  Dental status:  Cardiovascular:  Regular rate and cardiac rhythm by pulse,  without distended neck veins. Respiratory: Lungs are clear to auscultation.  Skin:  Without evidence of ankle edema, or rash. Trunk: The patient's posture is erect.   NEUROLOGIC EXAM: The patient is awake and alert, oriented to place and time.   Memory subjective described  as intact.  Attention span & concentration ability appears normal.  Speech is fluent,  without  dysarthria, dysphonia or aphasia.  Mood and affect are appropriate.   Cranial nerves:  patient wears sunglasses to avoid photophobic trigger  Pupils are equal and briskly reactive to light. Funduscopic exam deferred. Lazy eye.  Left eye is suppressed, lazy eyes.  Extraocular movements in vertical and horizontal planes were intact and without nystagmus. No Diplopia. Visual fields by finger perimetry are intact. Hearing was intact to soft voice and finger rubbing.    Neck ROM : rotation, tilt and flexion extension were normal for age and shoulder shrug was symmetrical.    Motor exam:  Symmetric bulk, tone and ROM.   Normal tone without cog wheeling, symmetric grip strength .   Deep tendon reflexes: in the  upper and lower extremities are symmetric and intact.  Babinski response was deferred.    ASSESSMENT AND PLAN 53  year- old- male  here with:    1) AM headaches and  are cluster type migraine derivate that wake this patient out of sleep.   2) Severe OSA by HST - recommending CPAP/ Calculated pAHI (per hour): 32/h                           REM pAHI:    39/h    NREM pAHI:   30.5/h                           Positional AHI: The patient slept mostly in supine for about 230 minutes, and  associated with an AHI of 50.6/h.   In contrast, Mr. Yeagle slept 123 minutes on his left side- with an AHI of 7.4/h.   3)  Order DME : recommend an auto titration CPAP device with a pressure between 6 and 16 cm water pressure and 2 cm EPR, heated humidification and a sub nasal interface.   Rv in 90 days with NP for compliance and HA effect.     I plan to follow up either personally or through our NP within 90  days   I would like to thank Rebecka Apley, [address] [place],  [place] [address] for allowing me to meet with and to  take care of this pleasant patient.      After spending a total time of  19  minutes face to face and additional time for physical and neurologic examination, review of laboratory studies,  personal review of imaging studies, reports and results of other testing and review of referral information / records as far as provided in visit,   Electronically signed by: Melvyn Novas, MD 10/04/2023 3:15 PM  [facility] Board certified by The [employer] of Sleep Medicine and Diplomate of the [employer] of Sleep Medicine. Board certified In Neurology through the ABPN, Fellow of the [employer] of Neu[employer].

## 2023-10-04 NOTE — Patient Instructions (Addendum)
I have recommended a  trial of positive airway pressure therapy until the patient has hopefully been able to reduce the BMI to under 30. This would be a 90 days use of CPAP, auto titration CPAP device , with a pressure setting between 6 and 16 cm water pressure and 2 cm EPR, heated humidification and a sub -asal interface.  With the application of CPAP, I would still want this patient to avoid supine sleep. CD   ASSESSMENT AND PLAN 53  year- old- male  here with:    1) AM headaches and  are cluster type migraine derivate that wake this patient out of sleep.   2) Severe OSA by HST - recommending CPAP/ Calculated pAHI (per hour): 32/h                           REM pAHI:    39/h    NREM pAHI:   30.5/h                           Positional AHI: The patient slept mostly in supine for about 230 minutes, and  associated with an AHI of 50.6/h.   In contrast, Steven Holland slept 123 minutes on his left side- with an AHI of 7.4/h.   3)  Order DME : recommend an auto titration CPAP device with a pressure between 6 and 16 cm water pressure and 2 cm EPR, heated humidification and a sub nasal interface.   Rv in 90 days with NP for compliance and HA effect.     I plan to follow up either personally or through our NP within 90  days   I would like to thank Rebecka Apley, [address] [place],  [place] [address] for allowing me to meet with and to take care of this pleasant patient.

## 2023-10-05 ENCOUNTER — Telehealth: Payer: Self-pay | Admitting: Neurology

## 2023-10-05 NOTE — Telephone Encounter (Signed)
Pt presented 1/29 for a follow up visit to discuss sleep study results. He has decided to move forward with CPAP. Order sent to [facility]

## 2023-12-08 ENCOUNTER — Encounter: Payer: Self-pay | Admitting: Neurology

## 2024-01-23 ENCOUNTER — Telehealth (INDEPENDENT_AMBULATORY_CARE_PROVIDER_SITE_OTHER): Admitting: Neurology

## 2024-01-23 ENCOUNTER — Encounter: Payer: Self-pay | Admitting: Neurology

## 2024-01-23 DIAGNOSIS — G4733 Obstructive sleep apnea (adult) (pediatric): Secondary | ICD-10-CM | POA: Diagnosis not present

## 2024-01-23 DIAGNOSIS — G43711 Chronic migraine without aura, intractable, with status migrainosus: Secondary | ICD-10-CM

## 2024-01-23 DIAGNOSIS — G43709 Chronic migraine without aura, not intractable, without status migrainosus: Secondary | ICD-10-CM

## 2024-01-23 HISTORY — DX: Obstructive sleep apnea (adult) (pediatric): G47.33

## 2024-01-23 NOTE — Progress Notes (Addendum)
 Patient: Steven Holland Date of Birth: [birth year]  Reason for Visit: Follow up History from: Patient Primary Neurologist: Dohmeier  Virtual Visit via Video Note  I connected with Steven Holland on 01/23/24 at  8:15 AM EDT by a video enabled telemedicine application and verified that I am speaking with the correct person using two identifiers.  Location: Patient: at his home Provider: in the office    I discussed the limitations of evaluation and management by telemedicine and the availability of in person appointments. The patient expressed understanding and agreed to proceed.  ASSESSMENT AND PLAN 53 y.o. year old male   1.  Severe OSA on CPAP (HST October 2024 Calculated pAHI (per hour): 32/h) 2.  Chronic migraine headache  -Doing great on CPAP.  Has noticed significant benefit to daytime energy, good improvement in migraine headaches.  Recommend continue CPAP nightly use minimum 4 hours.  He will continue to replace supplies routinely through DME.  Also recommend avoiding supine sleep, could benefit from weight loss in regards to apnea.  We will plan to follow-up in 6 months virtually or sooner if needed.  HISTORY OF PRESENT ILLNESS: Today 01/23/24 Saw Dr. Albertina Hugger January 2025 for sleep consult.  Has history of morning headache cluster type migraine that wake him out of sleep.  HST showed severe OSA. 11/09/23 setup date. CPAP report shows 80% compliance greater than 4 hours, 6 to 16 cm water.  AHI 3.8, leak 1.3. CPAP has been going well. Immediately noticed more daytime energy. Has reduced migraine  frequency by 30% severity by 20% duration by 30%, he uses nasal pillow mask P-30-I. He started a new job, is very thankful for CPAP, has helped him maintain his new job without migraine being problematic. 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 His wife works 2nd shift, he was unemployed for a few months, is now readjusting his sleep patterns to get on a more normal  sleep pattern to make sure he uses his CPAP closer to 6-7 hours. He aims to go to bed around 10 PM, gets up at 6 AM.  Remains on Ajovy from PCP.   HISTORY Steven Holland is a 53 y.o. male patient who is here for revisit 10/04/2023 for  follow up on his Sleep study HST 06-2023.Steven Holland  Chief concern according to patient :   morning headaches, more frequent and more intense since I lost December 6th, my closest friend passed dec  2024.   He is frustrated, depressed and anxious.    Hx of subdural hematoma in February 2024.  The patient endorsed no excessive form of sleepiness his Epworth Sleepiness Scale was endorsed at 2 points and his fatigue severity score endorsed at 42 out of 63 points.  I explained the findings of the home sleep test from 06-28-2023 as referred to by Dr. Narciso Backers.  He had severe sleep apnea this was obstructive sleep apnea only with an AHI of 32/h not a huge REM sleep exacerbation versus non-REM sleep but very much positional dependent and none supine sleep, the AHI was 7.4/h and in supine sleep the AHI was 50.6/h.  The nadir of oxygen saturation was clinically irrelevant at 88% and there was not even 1 minute of total desaturation time.  Heart rate varied within the normal range between 58 and 114 bpm.  So I felt that this patient would benefit from avoiding supine sleep and starting a CPAP.  I would be hesitant to recommend alternative to CPAP such as  dental devices or inspire but a CPAP is reversible and has a potential to improve his headache disorder especially the morning headache component.  If CPAP cannot be tolerated for any reason I would rather recommend a dental device for partial treatment then for the patient to remain untreated.  And again I would like for him to try to lose some weight which will certainly help the overall snoring volume and apnea frequency.   REVIEW OF SYSTEMS: Out of a complete 14 system review of symptoms, the patient complains only of the following  symptoms, and all other reviewed systems are negative.  See HPI  ALLERGIES: Allergies  Allergen Reactions   Sulfa Antibiotics Swelling    Eyes became swollen and ear became swollen (localized)   Milk-Related Compounds Other (See Comments)    Intense abdominal pain and passing out if he consumes over 4 ounces of milk , pt can have ice cream , only happens with plain milk, can have it in cakes, etc.   Nickel Rash    HOME MEDICATIONS: Outpatient Medications Prior to Visit  Medication Sig Dispense Refill   AJOVY 225 MG/1.5ML SOAJ      baclofen  (LIORESAL ) 10 MG tablet TAKE 1 TABLET BY MOUTH THREE TIMES A DAY AS NEEDED FOR MUSCLE SPASM 270 tablet 0   busPIRone  (BUSPAR ) 7.5 MG tablet Take 1 tablet (7.5 mg total) by mouth 2 (two) times daily as needed. 180 tablet 3   co-enzyme Q-10 30 MG capsule Take 30 mg by mouth 3 (three) times daily.     folic acid (FOLVITE) 400 MCG tablet Take 400 mcg by mouth daily.     hydrochlorothiazide  (HYDRODIURIL ) 25 MG tablet Take 1 tablet (25 mg total) by mouth daily. 90 tablet 3   HYDROcodone -acetaminophen  (VICODIN) 5-325 mg TABS tablet      Magnesium 250 MG TABS Take 250 mg by mouth daily.      Multiple Vitamin (MULITIVITAMIN WITH MINERALS) TABS Take 1 tablet by mouth daily.     omeprazole  (PRILOSEC) 20 MG capsule Take 1 capsule (20 mg total) by mouth daily. 90 capsule 3   ondansetron  (ZOFRAN ) 8 MG tablet TAKE 1 TABLET BY MOUTH EVERY 8 HOURS AS NEEDED 9 tablet 11   POTASSIUM CITRATE ER PO Take 400 mg by mouth daily.     Tadalafil  2.5 MG TABS TAKE 1 TABLET BY MOUTH EVERY DAY 90 tablet 3   Testosterone  20.25 MG/ACT (1.62%) GEL APPLY 4 PUMPS TOPICALLY DAILY 450 g 5   vitamin B-12 (CYANOCOBALAMIN) 1000 MCG tablet Take 1,000 mcg by mouth daily.     No facility-administered medications prior to visit.    PAST MEDICAL HISTORY: Past Medical History:  Diagnosis Date   Allergy    Anxiety    Brain bleed (HCC)    11-2022  [facility]   Chicken pox    Chronic  headaches    Depression    GERD (gastroesophageal reflux disease)    Hyperlipidemia    Hypertension    IBS (irritable bowel syndrome)    Low testosterone     Obesity    Syncope    related to IBS and severe nausea, no current problems    PAST SURGICAL HISTORY: Past Surgical History:  Procedure Laterality Date   EYE SURGERY Right 06/23/2020   Retina surgery   IR ANGIO INTRA EXTRACRAN SEL COM CAROTID INNOMINATE BILAT MOD SED  06/20/2023   IR ANGIO VERTEBRAL SEL VERTEBRAL UNI R MOD SED  06/20/2023   IR RADIOLOGIST EVAL & MGMT  03/17/2023   IR RADIOLOGIST EVAL & MGMT  07/03/2023   IR US  GUIDE VASC ACCESS RIGHT  06/20/2023   NO PAST SURGERIES      FAMILY HISTORY: Family History  Problem Relation Age of Onset   Migraines Mother    Arthritis Mother        knee replacements and back   Heart disease Father        30s reportedly- patient no issues   Benign prostatic hyperplasia Father    Other Brother        hypogonadism/low T both brothers   Migraines Brother        bothbrothers   Prostate cancer Maternal Grandfather    Diabetes Paternal Grandmother    Colon cancer Neg Hx    Rectal cancer Neg Hx    Stomach cancer Neg Hx    Esophageal cancer Neg Hx     SOCIAL HISTORY: Social History   Socioeconomic History   Marital status: Married    Spouse name: Not on file   Number of children: 0   Years of education: 4   Highest education level: Not on file  Occupational History   Occupation: Futures trader  Tobacco Use   Smoking status: Never   Smokeless tobacco: Never  Vaping Use   Vaping status: Never Used  Substance and Sexual Activity   Alcohol use: Yes    Comment: occasional liquor/wine   Drug use: No   Sexual activity: Yes  Other Topics Concern   Not on file  Social History Narrative   Married wife Abran Abrahams. No children.       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 Very busy at work. Enjoys Psychologist, educational. Household  projects and Holiday representative.       Caffeine  1-2 sodas day/ exercise a little   One story home   Right handed   Social Drivers of Health   Financial Resource Strain: Low Risk  (11/16/2023)   Received from [facility]   Overall Financial Resource Strain (CARDIA)    Difficulty of Paying Living Expenses: Not very hard  Food Insecurity: No Food Insecurity (11/16/2023)   Received from [facility]   Hunger Vital Sign    Worried About Running Out of Food in the Last Year: Never true    Ran Out of Food in the Last Year: Never true  Transportation Needs: No Transportation Needs (11/16/2023)   Received from [facility] - Transportation    Lack of Transportation (Medical): No    Lack of Transportation (Non-Medical): No  Physical Activity: Sufficiently Active (05/18/2023)   Received from [facility]   Exercise Vital Sign    Days of Exercise per Week: 5 days    Minutes of Exercise per Session: 30 min  Stress: Stress Concern Present (05/18/2023)   Received from [facility] - Occupational Stress Questionnaire    Feeling of Stress : To some extent  Social Connections: Moderately Integrated (05/18/2023)   Received from [facility]   Social Network    How would you rate your social network (family, work, friends)?: Adequate participation with social networks  Intimate Partner Violence: Not At Risk (05/18/2023)   Received from [facility]   HITS    Over the last 12 months how often did your partner physically hurt you?: Never    Over the last 12 months how often did your partner insult you or  talk down to you?: Never    Over the last 12 months how often did your partner threaten you with physical harm?: Never    Over the last 12 months how often did your partner scream or curse at you?: Never    PHYSICAL EXAM  There were no vitals filed for this visit. There is no height or weight on file to calculate BMI.  Generalized: Well  developed, in no acute distress  Via video visit, is alert and oriented, speech is clear and concise, facial symmetry noted, moves about freely  DIAGNOSTIC DATA (LABS, IMAGING, TESTING) - I reviewed patient records, labs, notes, testing and imaging myself where available.  Lab Results  Component Value Date   WBC 7.8 06/20/2023   HGB 12.9 (L) 06/20/2023   HCT 39.1 06/20/2023   MCV 87.1 06/20/2023   PLT 212 06/20/2023      Component Value Date/Time   NA 142 06/20/2023 0656   NA 144 02/22/2023 1147   K 3.5 06/20/2023 0656   CL 102 06/20/2023 0656   CO2 29 06/20/2023 0656   GLUCOSE 111 (H) 06/20/2023 0656   BUN 14 06/20/2023 0656   BUN 11 02/22/2023 1147   CREATININE 1.01 06/20/2023 0656   CREATININE 0.88 06/26/2020 1006   CALCIUM 9.5 06/20/2023 0656   PROT 7.0 02/22/2023 1147   ALBUMIN 4.5 02/22/2023 1147   AST 37 02/22/2023 1147   ALT 55 (H) 02/22/2023 1147   ALKPHOS 92 02/22/2023 1147   BILITOT 0.3 02/22/2023 1147   GFRNONAA >60 06/20/2023 0656   GFRNONAA 89 07/05/2016 1618   GFRAA >60 05/16/2018 0357   GFRAA >89 07/05/2016 1618   Lab Results  Component Value Date   CHOL 208 (H) 08/17/2021   HDL 47.70 08/17/2021   LDLCALC 141 (H) 06/26/2020   LDLDIRECT 136.0 08/17/2021   TRIG 259.0 (H) 08/17/2021   CHOLHDL 4 08/17/2021   Lab Results  Component Value Date   HGBA1C 6.0 08/17/2021   Lab Results  Component Value Date   VITAMINB12 >1500 (H) 05/06/2019   Lab Results  Component Value Date   TSH 1.230 02/22/2023    Jeanmarie Millet, AGNP-C, DNP 01/23/2024, 8:26 AM [facility] [address], [address] [place], [place] [address] [phone]

## 2024-01-23 NOTE — Patient Instructions (Signed)
 Great to meet you today.  Keep up the great work with CPAP.  Continue to use nightly for minimum 4 hours.  You could also benefit from weight loss and avoiding supine sleep in regards to managing your apnea.  We will plan to follow-up in 6 months virtually.  Please reach out if you have any questions.  Thanks!!

## 2024-03-01 ENCOUNTER — Encounter (HOSPITAL_COMMUNITY): Payer: Self-pay | Admitting: Interventional Radiology

## 2024-08-06 NOTE — Progress Notes (Deleted)
 Steven Holland

## 2024-08-07 ENCOUNTER — Telehealth: Admitting: Neurology
